# Patient Record
Sex: Male | Born: 1975 | Race: White | Hispanic: No | Marital: Married | Smoking: Never smoker
Health system: Southern US, Community
[De-identification: ages and names within clinical notes are randomized; demographics above are authoritative.]

## PROBLEM LIST (undated history)

## (undated) DIAGNOSIS — E291 Testicular hypofunction: Secondary | ICD-10-CM

## (undated) DIAGNOSIS — F329 Major depressive disorder, single episode, unspecified: Secondary | ICD-10-CM

## (undated) DIAGNOSIS — K219 Gastro-esophageal reflux disease without esophagitis: Secondary | ICD-10-CM

## (undated) DIAGNOSIS — E785 Hyperlipidemia, unspecified: Secondary | ICD-10-CM

## (undated) DIAGNOSIS — Z9989 Dependence on other enabling machines and devices: Principal | ICD-10-CM

## (undated) DIAGNOSIS — E559 Vitamin D deficiency, unspecified: Secondary | ICD-10-CM

## (undated) DIAGNOSIS — F419 Anxiety disorder, unspecified: Secondary | ICD-10-CM

## (undated) DIAGNOSIS — E669 Obesity, unspecified: Secondary | ICD-10-CM

## (undated) DIAGNOSIS — G4733 Obstructive sleep apnea (adult) (pediatric): Secondary | ICD-10-CM

## (undated) DIAGNOSIS — F32A Depression, unspecified: Secondary | ICD-10-CM

## (undated) DIAGNOSIS — I10 Essential (primary) hypertension: Secondary | ICD-10-CM

## (undated) HISTORY — DX: Obstructive sleep apnea (adult) (pediatric): G47.33

## (undated) HISTORY — DX: Depression, unspecified: F32.A

## (undated) HISTORY — DX: Anxiety disorder, unspecified: F41.9

## (undated) HISTORY — DX: Obesity, unspecified: E66.9

## (undated) HISTORY — DX: Testicular hypofunction: E29.1

## (undated) HISTORY — DX: Major depressive disorder, single episode, unspecified: F32.9

## (undated) HISTORY — DX: Dependence on other enabling machines and devices: Z99.89

## (undated) HISTORY — DX: Essential (primary) hypertension: I10

## (undated) HISTORY — DX: Gastro-esophageal reflux disease without esophagitis: K21.9

## (undated) HISTORY — DX: Vitamin D deficiency, unspecified: E55.9

## (undated) HISTORY — DX: Hyperlipidemia, unspecified: E78.5

---

## 2009-11-15 ENCOUNTER — Ambulatory Visit (HOSPITAL_COMMUNITY): Admission: RE | Admit: 2009-11-15 | Discharge: 2009-11-15 | Payer: Self-pay | Admitting: Internal Medicine

## 2013-01-06 ENCOUNTER — Other Ambulatory Visit: Payer: Self-pay | Admitting: Physician Assistant

## 2013-01-06 MED ORDER — ALPRAZOLAM 1 MG PO TABS: 1.0000 mg | ORAL_TABLET | Freq: Every evening | ORAL | Status: DC | PRN

## 2013-01-08 ENCOUNTER — Encounter: Payer: Self-pay | Admitting: Internal Medicine

## 2013-01-09 ENCOUNTER — Ambulatory Visit: Payer: Managed Care, Other (non HMO) | Admitting: Internal Medicine

## 2013-01-09 ENCOUNTER — Encounter: Payer: Self-pay | Admitting: Internal Medicine

## 2013-01-09 VITALS — BP 136/90 | HR 72 | Temp 98.1°F | Resp 16 | Ht 76.0 in | Wt 234.2 lb

## 2013-01-09 DIAGNOSIS — Z113 Encounter for screening for infections with a predominantly sexual mode of transmission: Secondary | ICD-10-CM

## 2013-01-09 DIAGNOSIS — Z0000 Encounter for general adult medical examination without abnormal findings: Secondary | ICD-10-CM

## 2013-01-09 DIAGNOSIS — Z1212 Encounter for screening for malignant neoplasm of rectum: Secondary | ICD-10-CM

## 2013-01-09 DIAGNOSIS — E782 Mixed hyperlipidemia: Secondary | ICD-10-CM

## 2013-01-09 DIAGNOSIS — F3181 Bipolar II disorder: Secondary | ICD-10-CM | POA: Insufficient documentation

## 2013-01-09 DIAGNOSIS — I10 Essential (primary) hypertension: Secondary | ICD-10-CM

## 2013-01-09 DIAGNOSIS — F418 Other specified anxiety disorders: Secondary | ICD-10-CM

## 2013-01-09 DIAGNOSIS — E559 Vitamin D deficiency, unspecified: Secondary | ICD-10-CM

## 2013-01-09 DIAGNOSIS — N529 Male erectile dysfunction, unspecified: Secondary | ICD-10-CM

## 2013-01-09 LAB — CBC WITH DIFFERENTIAL/PLATELET
Basophils Relative: 0 % (ref 0–1)
Eosinophils Absolute: 0 10*3/uL (ref 0.0–0.7)
HCT: 45.7 % (ref 39.0–52.0)
Hemoglobin: 15.9 g/dL (ref 13.0–17.0)
MCHC: 34.8 g/dL (ref 30.0–36.0)
MCV: 89.8 fL (ref 78.0–100.0)
Monocytes Relative: 9 % (ref 3–12)
Neutro Abs: 3 10*3/uL (ref 1.7–7.7)
Neutrophils Relative %: 62 % (ref 43–77)
RBC: 5.09 MIL/uL (ref 4.22–5.81)
WBC: 4.8 10*3/uL (ref 4.0–10.5)

## 2013-01-09 NOTE — Progress Notes (Signed)
Patient ID: Ian Zavala, male   DOB: 1975/10/11, 37 y.o.   MRN: [id number]  Annual Screening Comprehensive Examination  This very nice37 yo MWM presents for complete physical.  Patient has been followed for HTN, Prediabetes, Hyperlipidemia, and Vitamin D Deficiency.   Patient's BP has been controlled at home. Patient denies any cardiac Symptoms as chest pain, palpitations, shortness of breath, dizziness or ankle swelling.   Patient's hyperlipidemia is controlled with diet and medications. Patient denies myalgias or other medication SE's. Last cholesterol last visit was 140 , triglycerides 150, HDL 42  and LDL 68 at goal.     Patient has hx/o abnormal glucose with last A1c 5.3% in June . Patient denies reactive hypoglycemic symptoms, visual blurring, diabetic polys, or paresthesias.    Also, patient has hx/o depression apparently well compensated on his current medication w/o unstable mood changes or suicidal ideations.   Finally, patient has history of Vitamin D Deficiency with last vitamin D      Current outpatient prescriptions: ALPRAZolam (XANAX) 1 MG tablet, Take 1 tablet (1 mg total) by mouth at bedtime as needed for sleep. VITAMIN D  Take 5000 IU daily   enalapril VASOTEC Take 10 mg qd rosuvastatin 20 MG Take 1/2 tablet daily sertraline Take 100 mg Takes 1/2 tab daily testosterone cypionate (DEPOTESTOTERONE CYPIONATE) 200 MG q14 days take  1&1/2 cc every month, bisoprolol-hydrochlorothiazide (ZIAC) 10-6.25 MG omeprazole (PRILOSEC) 40 MG capsule    Allergies  Allergen Reactions  . Citalopram   . Pravastatin   . Wellbutrin [Bupropion]     Past Medical History  Diagnosis Date  . Hyperlipidemia   . Hypertension   . Hypogonadism male   . Vitamin D deficiency   . GERD (gastroesophageal reflux disease)   . Anxiety   . Depression     No past surgical history on file.  Family History  Problem Relation Age of Onset  . Hypertension Mother   . Depression Mother   .  Fibromyalgia Mother   . Depression Brother   . Hypertension Brother     History   Social History  . Marital Status: Married    Spouse Name: N/A    Number of Children: N/A  . Years of Education: N/A   Occupational History  . 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 Smoking status: Never Smoker   . Smokeless tobacco: Never Used  . Alcohol Use: 6.0 oz/week    12 drink(s) per week  . Drug Use: No  . Sexual Activity: Yes      ROS Constitutional: Denies fever, chills, weight loss/gain, headaches, insomnia, fatigue, night sweats, and change in appetite. Eyes: Denies redness, blurred vision, diplopia, discharge, itchy, watery eyes.  ENT: Denies discharge, congestion, post nasal drip, epistaxis, sore throat, earache, hearing loss, dental pain, Tinnitus, Vertigo, Sinus pain, snoring.  Cardio: Denies chest pain, palpitations, irregular heartbeat, syncope, dyspnea, diaphoresis, orthopnea, PND, claudication, edema Respiratory: denies cough, dyspnea, DOE, pleurisy, hoarseness, laryngitis, wheezing.  Gastrointestinal: Denies dysphagia, heartburn, reflux, water brash, pain, cramps, nausea, vomiting, bloating, diarrhea, constipation, hematemesis, melena, hematochezia, jaundice, hemorrhoids Genitourinary: Denies dysuria, frequency, urgency, nocturia, hesitancy, discharge, hematuria, flank pain Musculoskeletal: Denies arthralgia, myalgia, stiffness, Jt. Swelling, pain, limp, and strain/sprain. Skin: Denies puritis, rash, hives, warts, acne, eczema, changing in skin lesion Neuro: Weakness, tremor, incoordination, spasms, paresthesia, pain Psychiatric: Denies confusion, memory loss, sensory loss Endocrine: Denies change in weight, skin, hair change, nocturia, and paresthesia, diabetic polys, visual blurring,  hyper /hypo glycemic episodes.  Heme/Lymph: No excessive bleeding, bruising, enlarged lymph nodes.  Filed Vitals:   01/09/13 1621  BP: 136/90  Pulse: 72   Temp: 98.1 F (36.7 C)  Resp: 16   Body mass index is 28.52 kg/(m^2).  Physical Exam General Appearance: Well nourished, in no apparent distress. Eyes: PERRLA, EOMs, conjunctiva no swelling or erythema, normal fundi and vessels. Sinuses: No frontal/maxillary tenderness ENT/Mouth: EACs patent / TMs  nl. Nares clear without erythema, swelling, mucoid exudates. Oral hygiene is good. No erythema, swelling, or exudate. Tongue normal, non-obstructing. Tonsils not swollen or erythematous. Hearing normal.  Neck: Supple, thyroid normal. No bruits, nodes or JVD. Respiratory: Respiratory effort normal.  BS equal and clear bilateral without rales, rhonci, wheezing or stridor. Cardio: Heart sounds are normal with regular rate and rhythm and no murmurs, rubs or gallops. Peripheral pulses are normal and equal bilaterally without edema. No aortic or femoral bruits. Chest: symmetric with normal excursions and percussion.  Abdomen: Flat, soft, with bowl sounds. Nontender, no guarding, rebound, hernias, masses, or organomegaly.  Lymphatics: Non tender without lymphadenopathy.  Genitourinary: No hernias.Testes nl. DRE - prostate nl for age - smooth & firm w/o nodules. Musculoskeletal: Full ROM all peripheral extremities, joint stability, 5/5 strength, and normal gait. Skin: Warm and dry without rashes, lesions, cyanosis, clubbing or  ecchymosis.  Neuro: Cranial nerves intact, reflexes equal bilaterally. Normal muscle tone, no cerebellar symptoms. Sensation intact.  Pysch: Awake and oriented X 3, normal affect, Insight and Judgment appropriate.   Assessment and Plan  1. Annual Screening Examination 2. Hypertension  3. Hyperlipidemia 4. Pre Diabetes 5. Vitamin D Deficiency 6. Hypogonadism  Continue prudent diet as discussed, weight control, regular exercise, and medications. Routine screening labs and tests as requested with regular follow-up as recommended.

## 2013-01-09 NOTE — Patient Instructions (Signed)
Continue diet & medications same as discussed.   Further disposition pending lab results.        Hypertension As your heart beats, it forces blood through your arteries. This force is your blood pressure. If the pressure is too high, it is called hypertension (HTN) or high blood pressure. HTN is dangerous because you may have it and not know it. High blood pressure may mean that your heart has to work harder to pump blood. Your arteries may be narrow or stiff. The extra work puts you at risk for heart disease, stroke, and other problems.  Blood pressure consists of two numbers, a higher number over a lower, 110/72, for example. It is stated as "110 over 72." The ideal is below 120 for the top number (systolic) and under 80 for the bottom (diastolic). Write down your blood pressure today. You should pay close attention to your blood pressure if you have certain conditions such as:  Heart failure.  Prior heart attack.  Diabetes  Chronic kidney disease.  Prior stroke.  Multiple risk factors for heart disease. To see if you have HTN, your blood pressure should be measured while you are seated with your arm held at the level of the heart. It should be measured at least twice. A one-time elevated blood pressure reading (especially in the Emergency Department) does not mean that you need treatment. There may be conditions in which the blood pressure is different between your right and left arms. It is important to see your caregiver soon for a recheck. Most people have essential hypertension which means that there is not a specific cause. This type of high blood pressure may be lowered by changing lifestyle factors such as:  Stress.  Smoking.  Lack of exercise.  Excessive weight.  Drug/tobacco/alcohol use.  Eating less salt. Most people do not have symptoms from high blood pressure until it has caused damage to the body. Effective treatment can often prevent, delay or reduce that  damage. TREATMENT  When a cause has been identified, treatment for high blood pressure is directed at the cause. There are a large number of medications to treat HTN. These fall into several categories, and your caregiver will help you select the medicines that are best for you. Medications may have side effects. You should review side effects with your caregiver. If your blood pressure stays high after you have made lifestyle changes or started on medicines,   Your medication(s) may need to be changed.  Other problems may need to be addressed.  Be certain you understand your prescriptions, and know how and when to take your medicine.  Be sure to follow up with your caregiver within the time frame advised (usually within two weeks) to have your blood pressure rechecked and to review your medications.  If you are taking more than one medicine to lower your blood pressure, make sure you know how and at what times they should be taken. Taking two medicines at the same time can result in blood pressure that is too low. SEEK IMMEDIATE MEDICAL CARE IF:  You develop a severe headache, blurred or changing vision, or confusion.  You have unusual weakness or numbness, or a faint feeling.  You have severe chest or abdominal pain, vomiting, or breathing problems. MAKE SURE YOU:   Understand these instructions.  Will watch your condition.  Will get help right away if you are not doing well or get worse. Document Released: 02/06/2005 Document Revised: 05/01/2011 Document Reviewed:  09/27/2007 [employer] Patient Information 2014 [place], [place].  Cholesterol Cholesterol is a white, waxy, fat-like protein needed by your body in small amounts. The liver makes all the cholesterol you need. It is carried from the liver by the blood through the blood vessels. Deposits (plaque) may build up on blood vessel walls. This makes the arteries narrower and stiffer. Plaque increases the risk for heart attack and  stroke. You cannot feel your cholesterol level even if it is very high. The only way to know is by a blood test to check your lipid (fats) levels. Once you know your cholesterol levels, you should keep a record of the test results. Work with your caregiver to to keep your levels in the desired range. WHAT THE RESULTS MEAN:  Total cholesterol is a rough measure of all the cholesterol in your blood.  LDL is the so-called bad cholesterol. This is the type that deposits cholesterol in the walls of the arteries. You want this level to be low.  HDL is the good cholesterol because it cleans the arteries and carries the LDL away. You want this level to be high.  Triglycerides are fat that the body can either burn for energy or store. High levels are closely linked to heart disease. DESIRED LEVELS:  Total cholesterol below 200.  LDL below 100 for people at risk, below 70 for very high risk.  HDL above 50 is good, above 60 is best.  Triglycerides below 150. HOW TO LOWER YOUR CHOLESTEROL:  Diet.  Choose fish or white meat chicken and [place], roasted or baked. Limit fatty cuts of red meat, fried foods, and processed meats, such as sausage and lunch meat.  Eat lots of fresh fruits and vegetables. Choose whole grains, beans, pasta, potatoes and cereals.  Use only small amounts of olive, corn or canola oils. Avoid butter, mayonnaise, shortening or palm kernel oils. Avoid foods with trans-fats.  Use skim/nonfat milk and low-fat/nonfat yogurt and cheeses. Avoid whole milk, cream, ice cream, egg yolks and cheeses. Healthy desserts include angel food cake, ginger snaps, animal crackers, hard candy, popsicles, and low-fat/nonfat frozen yogurt. Avoid pastries, cakes, pies and cookies.  Exercise.  A regular program helps decrease LDL and raises HDL.  Helps with weight control.  Do things that increase your activity level like gardening, walking, or taking the stairs.  Medication.  May be  prescribed by your caregiver to help lowering cholesterol and the risk for heart disease.  You may need medicine even if your levels are normal if you have several risk factors. HOME CARE INSTRUCTIONS   Follow your diet and exercise programs as suggested by your caregiver.  Take medications as directed.  Have blood work done when your caregiver feels it is necessary. MAKE SURE YOU:   Understand these instructions.  Will watch your condition.  Will get help right away if you are not doing well or get worse. Document Released: 11/01/2000 Document Revised: 05/01/2011 Document Reviewed: 04/24/2007 [facility] Patient Information 2014 [place], [place].  Sleep Apnea  Sleep apnea is a sleep disorder characterized by abnormal pauses in breathing while you sleep. When your breathing pauses, the level of oxygen in your blood decreases. This causes you to move out of deep sleep and into light sleep. As a result, your quality of sleep is poor, and the system that carries your blood throughout your body (cardiovascular system) experiences stress. If sleep apnea remains untreated, the following conditions can develop:  High blood pressure (hypertension).  Coronary artery disease.  Inability to achieve or maintain an erection (impotence).  Impairment of your thought process (cognitive dysfunction). There are three types of sleep apnea: 1. Obstructive sleep apnea Pauses in breathing during sleep because of a blocked airway. 2. Central sleep apnea Pauses in breathing during sleep because the area of the brain that controls your breathing does not send the correct signals to the muscles that control breathing. 3. Mixed sleep apnea A combination of both obstructive and central sleep apnea. RISK FACTORS The following risk factors can increase your risk of developing sleep apnea:  Being overweight.  Smoking.  Having narrow passages in your nose and throat.  Being of older age.  Being  male.  Alcohol use.  Sedative and tranquilizer use.  Ethnicity. Among individuals younger than 35 years, African Americans are at increased risk of sleep apnea. SYMPTOMS   Difficulty staying asleep.  Daytime sleepiness and fatigue.  Loss of energy.  Irritability.  Loud, heavy snoring.  Morning headaches.  Trouble concentrating.  Forgetfulness.  Decreased interest in sex. DIAGNOSIS  In order to diagnose sleep apnea, your caregiver will perform a physical examination. Your caregiver may suggest that you take a home sleep test. Your caregiver may also recommend that you spend the night in a sleep lab. In the sleep lab, several monitors record information about your heart, lungs, and brain while you sleep. Your leg and arm movements and blood oxygen level are also recorded. TREATMENT The following actions may help to resolve mild sleep apnea:  Sleeping on your side.   Using a decongestant if you have nasal congestion.   Avoiding the use of depressants, including alcohol, sedatives, and narcotics.   Losing weight and modifying your diet if you are overweight. There also are devices and treatments to help open your airway:  Oral appliances. These are custom-made mouthpieces that shift your lower jaw forward and slightly open your bite. This opens your airway.  Devices that create positive airway pressure. This positive pressure "splints" your airway open to help you breathe better during sleep. The following devices create positive airway pressure:  Continuous positive airway pressure (CPAP) device. The CPAP device creates a continuous level of air pressure with an air pump. The air is delivered to your airway through a mask while you sleep. This continuous pressure keeps your airway open.  Nasal expiratory positive airway pressure (EPAP) device. The EPAP device creates positive air pressure as you exhale. The device consists of single-use valves, which are inserted into  each nostril and held in place by adhesive. The valves create very little resistance when you inhale but create much more resistance when you exhale. That increased resistance creates the positive airway pressure. This positive pressure while you exhale keeps your airway open, making it easier to breath when you inhale again.  Bilevel positive airway pressure (BPAP) device. The BPAP device is used mainly in patients with central sleep apnea. This device is similar to the CPAP device because it also uses an air pump to deliver continuous air pressure through a mask. However, with the BPAP machine, the pressure is set at two different levels. The pressure when you exhale is lower than the pressure when you inhale.  Surgery. Typically, surgery is only done if you cannot comply with less invasive treatments or if the less invasive treatments do not improve your condition. Surgery involves removing excess tissue in your airway to create a wider passage way. Document Released: 01/27/2002 Document Revised: 06/03/2012 Document Reviewed: 06/15/2011 [employer] Patient Information 2014  [employer].

## 2013-01-10 LAB — URINALYSIS, MICROSCOPIC ONLY
Bacteria, UA: NONE SEEN
Casts: NONE SEEN
Crystals: NONE SEEN

## 2013-01-10 LAB — MAGNESIUM: Magnesium: 2.2 mg/dL (ref 1.5–2.5)

## 2013-01-10 LAB — BASIC METABOLIC PANEL WITH GFR
Calcium: 9.7 mg/dL (ref 8.4–10.5)
Creat: 1.06 mg/dL (ref 0.50–1.35)
GFR, Est African American: >89 mL/min
GFR, Est Non African American: 89 mL/min
Glucose, Bld: 96 mg/dL (ref 70–99)
Potassium: 4.5 mEq/L (ref 3.5–5.3)
Sodium: 136 mEq/L (ref 135–145)

## 2013-01-10 LAB — HEPATIC FUNCTION PANEL
ALT: 26 U/L (ref 0–53)
Albumin: 4.7 g/dL (ref 3.5–5.2)
Bilirubin, Direct: 0.2 mg/dL (ref 0.0–0.3)
Indirect Bilirubin: 0.8 mg/dL (ref 0.0–0.9)
Total Bilirubin: 1 mg/dL (ref 0.3–1.2)

## 2013-01-10 LAB — LIPID PANEL
HDL: 45 mg/dL (ref >39)
LDL Cholesterol: 93 mg/dL (ref 0–99)
Total CHOL/HDL Ratio: 4.4 Ratio
Triglycerides: 306 mg/dL — ABNORMAL HIGH (ref <150)
VLDL: 61 mg/dL — ABNORMAL HIGH (ref 0–40)

## 2013-01-10 LAB — HIV ANTIBODY (ROUTINE TESTING W REFLEX): HIV: NONREACTIVE

## 2013-01-10 LAB — TSH: TSH: 2.103 u[IU]/mL (ref 0.350–4.500)

## 2013-01-10 LAB — TESTOSTERONE: Testosterone: 1090 ng/dL — ABNORMAL HIGH (ref 300–890)

## 2013-01-10 LAB — VITAMIN D 25 HYDROXY (VIT D DEFICIENCY, FRACTURES): Vit D, 25-Hydroxy: 68 ng/mL (ref 30–89)

## 2013-01-15 ENCOUNTER — Other Ambulatory Visit: Payer: Self-pay | Admitting: Physician Assistant

## 2013-01-15 MED ORDER — ALPRAZOLAM 1 MG PO TABS: 1.0000 mg | ORAL_TABLET | Freq: Three times a day (TID) | ORAL | Status: DC | PRN

## 2013-01-20 LAB — TB SKIN TEST
Induration: 0 mm
TB Skin Test: NEGATIVE

## 2013-02-24 ENCOUNTER — Other Ambulatory Visit: Payer: Self-pay | Admitting: Internal Medicine

## 2013-02-26 ENCOUNTER — Other Ambulatory Visit: Payer: Self-pay | Admitting: Internal Medicine

## 2013-03-07 ENCOUNTER — Other Ambulatory Visit: Payer: Self-pay | Admitting: Emergency Medicine

## 2013-03-31 ENCOUNTER — Other Ambulatory Visit: Payer: Self-pay | Admitting: Internal Medicine

## 2013-04-14 ENCOUNTER — Encounter: Payer: Self-pay | Admitting: Physician Assistant

## 2013-04-14 ENCOUNTER — Ambulatory Visit (INDEPENDENT_AMBULATORY_CARE_PROVIDER_SITE_OTHER): Payer: Managed Care, Other (non HMO) | Admitting: Physician Assistant

## 2013-04-14 VITALS — BP 138/88 | HR 76 | Temp 97.7°F | Resp 16 | Ht 76.0 in | Wt 237.0 lb

## 2013-04-14 DIAGNOSIS — Z79899 Other long term (current) drug therapy: Secondary | ICD-10-CM

## 2013-04-14 DIAGNOSIS — F341 Dysthymic disorder: Secondary | ICD-10-CM

## 2013-04-14 DIAGNOSIS — I10 Essential (primary) hypertension: Secondary | ICD-10-CM

## 2013-04-14 DIAGNOSIS — E782 Mixed hyperlipidemia: Secondary | ICD-10-CM

## 2013-04-14 DIAGNOSIS — E559 Vitamin D deficiency, unspecified: Secondary | ICD-10-CM

## 2013-04-14 DIAGNOSIS — F418 Other specified anxiety disorders: Secondary | ICD-10-CM

## 2013-04-14 DIAGNOSIS — E663 Overweight: Secondary | ICD-10-CM | POA: Insufficient documentation

## 2013-04-14 DIAGNOSIS — E669 Obesity, unspecified: Secondary | ICD-10-CM

## 2013-04-14 LAB — CBC WITH DIFFERENTIAL/PLATELET
BASOS ABS: 0 10*3/uL (ref 0.0–0.1)
Basophils Relative: 0 % (ref 0–1)
Eosinophils Absolute: 0.1 10*3/uL (ref 0.0–0.7)
Eosinophils Relative: 2 % (ref 0–5)
HCT: 44 % (ref 39.0–52.0)
Hemoglobin: 15.4 g/dL (ref 13.0–17.0)
LYMPHS PCT: 30 % (ref 12–46)
Lymphs Abs: 1.5 10*3/uL (ref 0.7–4.0)
MCH: 31.2 pg (ref 26.0–34.0)
MCHC: 35 g/dL (ref 30.0–36.0)
MCV: 89.1 fL (ref 78.0–100.0)
Monocytes Absolute: 0.3 10*3/uL (ref 0.1–1.0)
Monocytes Relative: 6 % (ref 3–12)
NEUTROS ABS: 3.1 10*3/uL (ref 1.7–7.7)
NEUTROS PCT: 62 % (ref 43–77)
PLATELETS: 224 10*3/uL (ref 150–400)
RBC: 4.94 MIL/uL (ref 4.22–5.81)
RDW: 13.1 % (ref 11.5–15.5)
WBC: 5 10*3/uL (ref 4.0–10.5)

## 2013-04-14 NOTE — Patient Instructions (Signed)
 Bad carbs also include fruit juice, alcohol, and sweet tea. These are empty calories that do not signal to your brain that you are full.   Please remember the good carbs are still carbs which convert into sugar. So please measure them out no more than 1/2-1 cup of rice, oatmeal, pasta, and beans.  Veggies are however free foods! Pile them on.   I like lean protein at every meal such as chicken, turkey, pork chops, cottage cheese, etc. Just do not fry these meats and please center your meal around vegetable, the meats should be a side dish.   No all fruit is created equal. Please see the list below, the fruit at the bottom is higher in sugars than the fruit at the top   Fat and Cholesterol Control Diet Fat and cholesterol levels in your blood and organs are influenced by your diet. High levels of fat and cholesterol may lead to diseases of the heart, small and large blood vessels, gallbladder, liver, and pancreas. CONTROLLING FAT AND CHOLESTEROL WITH DIET Although exercise and lifestyle factors are important, your diet is key. That is because certain foods are known to raise cholesterol and others to lower it. The goal is to balance foods for their effect on cholesterol and more importantly, to replace saturated and trans fat with other types of fat, such as monounsaturated fat, polyunsaturated fat, and omega-3 fatty acids. On average, a person should consume no more than 15 to 17 g of saturated fat daily. Saturated and trans fats are considered "bad" fats, and they will raise LDL cholesterol. Saturated fats are primarily found in animal products such as meats, butter, and cream. However, that does not mean you need to give up all your favorite foods. Today, there are good tasting, low-fat, low-cholesterol substitutes for most of the things you like to eat. Choose low-fat or nonfat alternatives. Choose round or loin cuts of red meat. These types of cuts are lowest in fat and cholesterol. Chicken  (without the skin), fish, veal, and ground turkey breast are great choices. Eliminate fatty meats, such as hot dogs and salami. Even shellfish have little or no saturated fat. Have a 3 oz (85 g) portion when you eat lean meat, poultry, or fish. Trans fats are also called "partially hydrogenated oils." They are oils that have been scientifically manipulated so that they are solid at room temperature resulting in a longer shelf life and improved taste and texture of foods in which they are added. Trans fats are found in stick margarine, some tub margarines, cookies, crackers, and baked goods.  When baking and cooking, oils are a great substitute for butter. The monounsaturated oils are especially beneficial since it is believed they lower LDL and raise HDL. The oils you should avoid entirely are saturated tropical oils, such as coconut and palm.  Remember to eat a lot from food groups that are naturally free of saturated and trans fat, including fish, fruit, vegetables, beans, grains (barley, rice, couscous, bulgur wheat), and pasta (without cream sauces).  IDENTIFYING FOODS THAT LOWER FAT AND CHOLESTEROL  Soluble fiber may lower your cholesterol. This type of fiber is found in fruits such as apples, vegetables such as broccoli, potatoes, and carrots, legumes such as beans, peas, and lentils, and grains such as barley. Foods fortified with plant sterols (phytosterol) may also lower cholesterol. You should eat at least 2 g per day of these foods for a cholesterol lowering effect.  Read package labels to identify low-saturated fats,   trans fat free, and low-fat foods at the supermarket. Select cheeses that have only 2 to 3 g saturated fat per ounce. Use a heart-healthy tub margarine that is free of trans fats or partially hydrogenated oil. When buying baked goods (cookies, crackers), avoid partially hydrogenated oils. Breads and muffins should be made from whole grains (whole-wheat or whole oat flour, instead of  "flour" or "enriched flour"). Buy non-creamy canned soups with reduced salt and no added fats.  FOOD PREPARATION TECHNIQUES  Never deep-fry. If you must fry, either stir-fry, which uses very little fat, or use non-stick cooking sprays. When possible, broil, bake, or roast meats, and steam vegetables. Instead of putting butter or margarine on vegetables, use lemon and herbs, applesauce, and cinnamon (for squash and sweet potatoes). Use nonfat yogurt, salsa, and low-fat dressings for salads.  LOW-SATURATED FAT / LOW-FAT FOOD SUBSTITUTES Meats / Saturated Fat (g)  Avoid: Steak, marbled (3 oz/85 g) / 11 g  Choose: Steak, lean (3 oz/85 g) / 4 g  Avoid: Hamburger (3 oz/85 g) / 7 g  Choose: Hamburger, lean (3 oz/85 g) / 5 g  Avoid: Ham (3 oz/85 g) / 6 g  Choose: Ham, lean cut (3 oz/85 g) / 2.4 g  Avoid: Chicken, with skin, dark meat (3 oz/85 g) / 4 g  Choose: Chicken, skin removed, dark meat (3 oz/85 g) / 2 g  Avoid: Chicken, with skin, light meat (3 oz/85 g) / 2.5 g  Choose: Chicken, skin removed, light meat (3 oz/85 g) / 1 g Dairy / Saturated Fat (g)  Avoid: Whole milk (1 cup) / 5 g  Choose: Low-fat milk, 2% (1 cup) / 3 g  Choose: Low-fat milk, 1% (1 cup) / 1.5 g  Choose: Skim milk (1 cup) / 0.3 g  Avoid: Hard cheese (1 oz/28 g) / 6 g  Choose: Skim milk cheese (1 oz/28 g) / 2 to 3 g  Avoid: Cottage cheese, 4% fat (1 cup) / 6.5 g  Choose: Low-fat cottage cheese, 1% fat (1 cup) / 1.5 g  Avoid: Ice cream (1 cup) / 9 g  Choose: Sherbet (1 cup) / 2.5 g  Choose: Nonfat frozen yogurt (1 cup) / 0.3 g  Choose: Frozen fruit bar / trace  Avoid: Whipped cream (1 tbs) / 3.5 g  Choose: Nondairy whipped topping (1 tbs) / 1 g Condiments / Saturated Fat (g)  Avoid: Mayonnaise (1 tbs) / 2 g  Choose: Low-fat mayonnaise (1 tbs) / 1 g  Avoid: Butter (1 tbs) / 7 g  Choose: Extra light margarine (1 tbs) / 1 g  Avoid: Coconut oil (1 tbs) / 11.8 g  Choose: Olive oil (1 tbs) / 1.8  g  Choose: Corn oil (1 tbs) / 1.7 g  Choose: Safflower oil (1 tbs) / 1.2 g  Choose: Sunflower oil (1 tbs) / 1.4 g  Choose: Soybean oil (1 tbs) / 2.4 g  Choose: Canola oil (1 tbs) / 1 g Document Released: 02/06/2005 Document Revised: 06/03/2012 Document Reviewed: 07/28/2010 [employer] Patient Information 2014 [employer].  

## 2013-04-14 NOTE — Progress Notes (Signed)
HPI 38 y.o. male  presents for 3 month follow up with hypertension, hyperlipidemia, prediabetes and vitamin D. His blood pressure has been controlled at home, today their BP is BP: 138/88 mmHg He denies chest pain, shortness of breath, dizziness. He recently moved to a larger house with his wife and 2 kids that he has not been eating as healthy or working out as much.  His cholesterol is diet controlled. In addition they are on crestor and denies myalgias. His cholesterol is controlled. The cholesterol last visit was:   Lab Results  Component Value Date   CHOL 199 01/09/2013   HDL 45 01/09/2013   LDLCALC 93 01/09/2013   TRIG 306* 01/09/2013   CHOLHDL 4.4 01/09/2013   Last Z6XA1C in the office was:  Lab Results  Component Value Date   HGBA1C 5.3 01/09/2013   Patient is on Vitamin D supplement.  Hypogonadism- does the shot every 2 weeks and states he can feel a difference, helps with his energy.   Current Medications:  Current Outpatient Prescriptions on File Prior to Visit  Medication Sig Dispense Refill  . ALPRAZolam (XANAX) 1 MG tablet Take 1 tablet (1 mg total) by mouth 3 (three) times daily as needed for sleep.  90 tablet  2  . bisoprolol-hydrochlorothiazide (ZIAC) 10-6.25 MG per tablet TAKE 1 TABLET DAILY FOR    BLOOD PRESSURE  90 tablet  1  . cholecalciferol (VITAMIN D) 1000 UNITS tablet Take 1,000 Units by mouth daily. Take 5000 IU daily.      . enalapril (VASOTEC) 20 MG tablet TAKE 1 TABLET BY MOUTH EVERY DAY  90 tablet  1  . omeprazole (PRILOSEC) 40 MG capsule       . rosuvastatin (CRESTOR) 20 MG tablet Take 20 mg by mouth daily. Take 1/2 tablet daily.      . sertraline (ZOLOFT) 100 MG tablet TAKE 1 TABLET EVERY NIGHT AT BEDTIME  90 tablet  0  . testosterone cypionate (DEPOTESTOTERONE CYPIONATE) 200 MG/ML injection Inject into the muscle every 28 (twenty-eight) days. 1&1/2 cc every month       No current facility-administered medications on file prior to visit.   Medical  History:  Past Medical History  Diagnosis Date  . Hyperlipidemia   . Hypertension   . Hypogonadism male   . Vitamin D deficiency   . GERD (gastroesophageal reflux disease)   . Anxiety   . Depression   . Obesity    Allergies:  Allergies  Allergen Reactions  . Citalopram   . Pravastatin   . Wellbutrin [Bupropion]      Review of Systems: [X]  = complains of  [ ]  = denies  General: Fatigue [ ]  Fever [ ]  Chills [ ]  Weakness [ ]   Insomnia [ ]  Eyes: Redness [ ]  Blurred vision [ ]  Diplopia [ ]   ENT: Congestion [ ]  Sinus Pain [ ]  Post Nasal Drip [ ]  Sore Throat [ ]  Earache [ ]   Cardiac: Chest pain/pressure [ ]  SOB [ ]  Orthopnea [ ]   Palpitations [ ]   Paroxysmal nocturnal dyspnea[ ]  Claudication [ ]  Edema [ ]   Pulmonary: Cough [ ]  Wheezing[ ]   SOB [ ]   Snoring [ ]   GI: Nausea [ ]  Vomiting[ ]  Dysphagia[ ]  Heartburn[ ]  Abdominal pain [ ]  Constipation [ ] ; Diarrhea [ ] ; BRBPR [ ]  Melena[ ]  GU: Hematuria[ ]  Dysuria [ ]  Nocturia[ ]  Urgency [ ]   Hesitancy [ ]  Discharge [ ]  Neuro: Headaches[ ]  Vertigo[ ]  Paresthesias[ ]  Spasm [ ]   Speech changes [ ]  Incoordination [ ]   Ortho: Arthritis [ ]  Joint pain [ ]  Muscle pain [ ]  Joint swelling [ ]  Back Pain [ ]  Skin:  Rash [ ]   Pruritis [ ]  Change in skin lesion [ ]   Psych: Depression[ ]  Anxiety[ ]  Confusion [ ]  Memory loss [ ]   Heme/Lypmh: Bleeding [ ]  Bruising [ ]  Enlarged lymph nodes [ ]   Endocrine: Visual blurring [ ]  Paresthesia [ ]  Polyuria [ ]  Polydypsea [ ]    Heat/cold intolerance [ ]  Hypoglycemia [ ]   Family history- Review and unchanged Social history- Review and unchanged Physical Exam: Filed Vitals:   04/14/13 1634  BP: 138/88  Pulse: 76  Temp: 97.7 F (36.5 C)  Resp: 16   Wt Readings from Last 3 Encounters:  04/14/13 237 lb (107.502 kg)  01/09/13 234 lb 3.2 oz (106.232 kg)   General Appearance: Well nourished, in no apparent distress. Eyes: PERRLA, EOMs, conjunctiva no swelling or erythema Sinuses: No Frontal/maxillary  tenderness ENT/Mouth: Ext aud canals clear, TMs without erythema, bulging. No erythema, swelling, or exudate on post pharynx.  Tonsils not swollen or erythematous. Hearing normal.  Neck: Supple, thyroid normal.  Respiratory: Respiratory effort normal, BS equal bilaterally without rales, rhonchi, wheezing or stridor.  Cardio: RRR with no MRGs. Brisk peripheral pulses without edema.  Abdomen: Soft, + BS.  Non tender, no guarding, rebound, hernias, masses. Lymphatics: Non tender without lymphadenopathy.  Musculoskeletal: Full ROM, 5/5 strength, normal gait.  Skin: Warm, dry without rashes, lesions, ecchymosis.  Neuro: Cranial nerves intact. Normal muscle tone, no cerebellar symptoms. Sensation intact.  Psych: Awake and oriented X 3, normal affect, Insight and Judgment appropriate.   Assessment and Plan:  Hypertension: Continue medication, monitor blood pressure at home.  Continue DASH diet. Cholesterol: Continue diet and exercise. Check cholesterol.  Hypogonadism- last shot Saturday  Vitamin D Def- check level and continue medications.  Obesity- long discussion weight loss  Continue diet and meds as discussed. Further disposition pending results of labs.  Quentin Mulling 4:59 PM

## 2013-04-15 LAB — MAGNESIUM: Magnesium: 2.1 mg/dL (ref 1.5–2.5)

## 2013-04-15 LAB — LIPID PANEL
CHOL/HDL RATIO: 3.5 ratio
Cholesterol: 198 mg/dL (ref 0–200)
HDL: 57 mg/dL (ref >39)
LDL CALC: 101 mg/dL — AB (ref 0–99)
Triglycerides: 200 mg/dL — ABNORMAL HIGH (ref <150)
VLDL: 40 mg/dL (ref 0–40)

## 2013-04-15 LAB — HEPATIC FUNCTION PANEL
ALBUMIN: 4.7 g/dL (ref 3.5–5.2)
ALK PHOS: 50 U/L (ref 39–117)
ALT: 22 U/L (ref 0–53)
AST: 27 U/L (ref 0–37)
BILIRUBIN INDIRECT: 0.7 mg/dL (ref 0.2–1.2)
BILIRUBIN TOTAL: 0.9 mg/dL (ref 0.2–1.2)
Bilirubin, Direct: 0.2 mg/dL (ref 0.0–0.3)
Total Protein: 7.1 g/dL (ref 6.0–8.3)

## 2013-04-15 LAB — BASIC METABOLIC PANEL WITH GFR
BUN: 13 mg/dL (ref 6–23)
CHLORIDE: 99 meq/L (ref 96–112)
CO2: 29 meq/L (ref 19–32)
CREATININE: 0.96 mg/dL (ref 0.50–1.35)
Calcium: 9.8 mg/dL (ref 8.4–10.5)
GFR, Est African American: >89 mL/min
Glucose, Bld: 76 mg/dL (ref 70–99)
POTASSIUM: 4.3 meq/L (ref 3.5–5.3)
Sodium: 136 mEq/L (ref 135–145)

## 2013-04-15 LAB — VITAMIN D 25 HYDROXY (VIT D DEFICIENCY, FRACTURES): Vit D, 25-Hydroxy: 100 ng/mL — ABNORMAL HIGH (ref 30–89)

## 2013-04-15 LAB — TSH: TSH: 1.713 u[IU]/mL (ref 0.350–4.500)

## 2013-06-02 ENCOUNTER — Other Ambulatory Visit: Payer: Self-pay | Admitting: Internal Medicine

## 2013-06-06 ENCOUNTER — Other Ambulatory Visit: Payer: Self-pay | Admitting: *Deleted

## 2013-06-06 MED ORDER — SERTRALINE HCL 100 MG PO TABS: ORAL_TABLET | ORAL | Status: DC

## 2013-06-07 ENCOUNTER — Other Ambulatory Visit: Payer: Self-pay | Admitting: Internal Medicine

## 2013-07-15 ENCOUNTER — Encounter: Payer: Self-pay | Admitting: Internal Medicine

## 2013-07-15 ENCOUNTER — Ambulatory Visit (INDEPENDENT_AMBULATORY_CARE_PROVIDER_SITE_OTHER): Payer: Managed Care, Other (non HMO) | Admitting: Emergency Medicine

## 2013-07-15 VITALS — BP 138/90 | HR 68 | Temp 98.2°F | Resp 16 | Ht 76.5 in | Wt 232.4 lb

## 2013-07-15 DIAGNOSIS — E559 Vitamin D deficiency, unspecified: Secondary | ICD-10-CM

## 2013-07-15 DIAGNOSIS — R6889 Other general symptoms and signs: Secondary | ICD-10-CM

## 2013-07-15 DIAGNOSIS — I10 Essential (primary) hypertension: Secondary | ICD-10-CM

## 2013-07-15 DIAGNOSIS — E782 Mixed hyperlipidemia: Secondary | ICD-10-CM

## 2013-07-15 DIAGNOSIS — R7309 Other abnormal glucose: Secondary | ICD-10-CM | POA: Insufficient documentation

## 2013-07-15 DIAGNOSIS — Z79899 Other long term (current) drug therapy: Secondary | ICD-10-CM

## 2013-07-15 DIAGNOSIS — E291 Testicular hypofunction: Secondary | ICD-10-CM

## 2013-07-15 DIAGNOSIS — N529 Male erectile dysfunction, unspecified: Secondary | ICD-10-CM

## 2013-07-15 LAB — CBC WITH DIFFERENTIAL/PLATELET
BASOS PCT: 0 % (ref 0–1)
Basophils Absolute: 0 10*3/uL (ref 0.0–0.1)
EOS ABS: 0 10*3/uL (ref 0.0–0.7)
EOS PCT: 1 % (ref 0–5)
HEMATOCRIT: 41.3 % (ref 39.0–52.0)
HEMOGLOBIN: 14.2 g/dL (ref 13.0–17.0)
Lymphocytes Relative: 29 % (ref 12–46)
Lymphs Abs: 1.3 10*3/uL (ref 0.7–4.0)
MCH: 31.6 pg (ref 26.0–34.0)
MCHC: 34.4 g/dL (ref 30.0–36.0)
MCV: 91.8 fL (ref 78.0–100.0)
MONO ABS: 0.3 10*3/uL (ref 0.1–1.0)
MONOS PCT: 6 % (ref 3–12)
Neutro Abs: 2.9 10*3/uL (ref 1.7–7.7)
Neutrophils Relative %: 64 % (ref 43–77)
Platelets: 208 10*3/uL (ref 150–400)
RBC: 4.5 MIL/uL (ref 4.22–5.81)
RDW: 13.5 % (ref 11.5–15.5)
WBC: 4.6 10*3/uL (ref 4.0–10.5)

## 2013-07-15 NOTE — Progress Notes (Deleted)
Patient ID: Ian Zavala, male   DOB: [birth year], 38 y.o.   MRN: [id number]    This very nice 38 y.o. male presents for 3 month follow up with Hypertension, Hyperlipidemia, Pre-Diabetes and Vitamin D Deficiency.    HTN predates since   . BP has been controlled at home. Today's  . Patient denies any cardiac type chest pain, palpitations, dyspnea/orthopnea/PND, dizziness, claudication, or dependent edema.   Hyperlipidemia is controlled with diet & meds. Last Cholesterol was  , Triglycerides were  , HDL    and LDL    . Patient denies myalgias or other med SE's.    Also, the patient has history of PreDiabetes/insulin resistance since     with last A1c of    . Patient denies any symptoms of reactive hypoglycemia, diabetic polys, paresthesias or visual blurring.   Further, Patient has history of Vitamin D Deficiency with last vitamin D of   . Patient supplements vitamin D without any suspected side-effects.    Medication List       This list is accurate as of: 07/15/13  4:52 PM.  Always use your most recent med list.               ALPRAZolam 1 MG tablet  Commonly known as:  XANAX  Take 1 tablet (1 mg total) by mouth 3 (three) times daily as needed for sleep.     bisoprolol-hydrochlorothiazide 10-6.25 MG per tablet  Commonly known as:  ZIAC  TAKE 1 TABLET DAILY FOR    BLOOD PRESSURE     cholecalciferol 1000 UNITS tablet  Commonly known as:  VITAMIN D  Take 1,000 Units by mouth daily. Take 5000 IU daily.     enalapril 20 MG tablet  Commonly known as:  VASOTEC  TAKE 1 TABLET BY MOUTH EVERY DAY     omeprazole 40 MG capsule  Commonly known as:  PRILOSEC  TAKE 1 CAPSULE DAILY FOR   ACID REFLUX     rosuvastatin 20 MG tablet  Commonly known as:  CRESTOR  Take 20 mg by mouth daily. Take 1/2 tablet daily.     sertraline 100 MG tablet  Commonly known as:  ZOLOFT  TAKE 1 TABLET EVERY NIGHT AT BEDTIME     testosterone cypionate 200 MG/ML injection  Commonly known as:   DEPOTESTOTERONE CYPIONATE  Inject into the muscle every 28 (twenty-eight) days. 1&1/2 cc every month         Allergies  Allergen Reactions  . Citalopram   . Pravastatin   . Wellbutrin [Bupropion]     PMHx:   Past Medical History  Diagnosis Date  . Hyperlipidemia   . Hypertension   . Hypogonadism male   . Vitamin D deficiency   . GERD (gastroesophageal reflux disease)   . Anxiety   . Depression   . Obesity     FHx:    Reviewed / unchanged  SHx:    Reviewed / unchanged   Systems Review: Constitutional: Denies fever, chills, wt changes, headaches, insomnia, fatigue, night sweats, change in appetite. Eyes: Denies redness, blurred vision, diplopia, discharge, itchy, watery eyes.  ENT: Denies discharge, congestion, post nasal drip, epistaxis, sore throat, earache, hearing loss, dental pain, tinnitus, vertigo, sinus pain, snoring.  CV: Denies chest pain, palpitations, irregular heartbeat, syncope, dyspnea, diaphoresis, orthopnea, PND, claudication or edema. Respiratory: denies cough, dyspnea, DOE, pleurisy, hoarseness, laryngitis, wheezing.  Gastrointestinal: Denies dysphagia, odynophagia, heartburn, reflux, water brash, abdominal pain or cramps, nausea, vomiting, bloating, diarrhea, constipation, hematemesis,  melena, hematochezia  or hemorrhoids. Genitourinary: Denies dysuria, frequency, urgency, nocturia, hesitancy, discharge, hematuria or flank pain. Musculoskeletal: Denies arthralgias, myalgias, stiffness, jt. swelling, pain, limping or strain/sprain.  Skin: Denies pruritus, rash, hives, warts, acne, eczema or change in skin lesion(s). Neuro: No weakness, tremor, incoordination, spasms, paresthesia or pain. Psychiatric: Denies confusion, memory loss or sensory loss. Endo: Denies change in weight, skin or hair change.  Heme/Lymph: No excessive bleeding, bruising or enlarged lymph nodes.   Exam:  There were no vitals taken for this visit.  Appears well nourished - in no  distress. Eyes: PERRLA, EOMs, conjunctiva no swelling or erythema. Sinuses: No frontal/maxillary tenderness ENT/Mouth: EAC's clear, TM's nl w/o erythema, bulging. Nares clear w/o erythema, swelling, exudates. Oropharynx clear without erythema or exudates. Oral hygiene is good. Tongue normal, non obstructing. Hearing intact.  Neck: Supple. Thyroid nl. Car 2+/2+ without bruits, nodes or JVD. Chest: Respirations nl with BS clear & equal w/o rales, rhonchi, wheezing or stridor.  Cor: Heart sounds normal w/ regular rate and rhythm without sig. murmurs, gallops, clicks, or rubs. Peripheral pulses normal and equal  without edema.  Abdomen: Soft & bowel sounds normal. Non-tender w/o guarding, rebound, hernias, masses, or organomegaly.  Lymphatics: Unremarkable.  Musculoskeletal: Full ROM all peripheral extremities, joint stability, 5/5 strength, and normal gait.  Skin: Warm, dry without exposed rashes, lesions or ecchymosis apparent.  Neuro: Cranial nerves intact, reflexes equal bilaterally. Sensory-motor testing grossly intact. Tendon reflexes grossly intact.  Pysch: Alert & oriented x 3. Insight and judgement nl & appropriate. No ideations.  Assessment and Plan:  1. Hypertension - Continue monitor blood pressure at home. Continue diet/meds same.  2. Hyperlipidemia - Continue diet/meds, exercise,& lifestyle modifications. Continue monitor periodic cholesterol/liver & renal functions   3. Pre-diabetes/Insulin Resistance - Continue diet, exercise, lifestyle modifications. Monitor appropriate labs.  3. Diabetes - continue recommend prudent low glycemic diet, weight control, regular exercise, diabetic monitoring and periodic eye exams.  4. Vitamin D Deficiency - Continue supplementation.  Recommended regular exercise, BP monitoring, weight control, and discussed med and SE's. Recommended labs to assess and monitor clinical status. Further disposition pending results of labs.

## 2013-07-15 NOTE — Progress Notes (Signed)
Subjective:    Patient ID: Ian Zavala, male    DOB: November 30, 1975, 38 y.o.   MRN: [id number]  HPI Comments: 38 yo WM presents for 3 month F/U for HTN, Cholesterol, Pre-Dm, D. Deficient.He notes cook outs over the weekend may have contributed to elevated BP. He also admits to >3 cups coffee QD. He has not been eating as well. He has not been exercising as much. He notes he needs testosterone level rechecked and has improved energy when shots given on schedule.   WBC             5.0   04/14/2013 HGB            15.4   04/14/2013 HCT            44.0   04/14/2013 PLT             224   04/14/2013 GLUCOSE          76   04/14/2013 CHOL            198   04/14/2013 TRIG            200   04/14/2013 HDL              57   04/14/2013 LDLCALC         101   04/14/2013 ALT              22   04/14/2013 AST              27   04/14/2013 NA              136   04/14/2013 K               4.3   04/14/2013 CL               99   04/14/2013 CREATININE     0.96   04/14/2013 BUN              13   04/14/2013 CO2              29   04/14/2013 TSH           1.713   04/14/2013 HGBA1C          5.3   01/09/2013 MICROALBUR     0.54   01/09/2013   Hyperlipidemia  Hypertension  Gastrophageal Reflux     Medication List       This list is accurate as of: 07/15/13  5:23 PM.  Always use your most recent med list.               ALPRAZolam 1 MG tablet  Commonly known as:  XANAX  Take 1 tablet (1 mg total) by mouth 3 (three) times daily as needed for sleep.     bisoprolol-hydrochlorothiazide 10-6.25 MG per tablet  Commonly known as:  ZIAC  TAKE 1 TABLET DAILY FOR    BLOOD PRESSURE     cholecalciferol 1000 UNITS tablet  Commonly known as:  VITAMIN D  Take 1,000 Units by mouth daily. Take 5000 IU daily.     enalapril 20 MG tablet  Commonly known as:  VASOTEC  TAKE 1 TABLET BY MOUTH EVERY DAY     omeprazole 40 MG capsule  Commonly known as:  PRILOSEC  TAKE 1 CAPSULE DAILY FOR   ACID REFLUX     rosuvastatin 20  MG tablet  Commonly known as:  CRESTOR  Take 20 mg by mouth daily. Take 1/2 tablet daily.     sertraline 100 MG tablet  Commonly known as:  ZOLOFT  TAKE 1 TABLET EVERY NIGHT AT BEDTIME     testosterone cypionate 200 MG/ML injection  Commonly known as:  DEPOTESTOTERONE CYPIONATE  Inject into the muscle every 28 (twenty-eight) days. 1&1/2 cc every month       Allergies  Allergen Reactions  . Citalopram   . Pravastatin   . Wellbutrin [Bupropion]    Past Medical History  Diagnosis Date  . Hyperlipidemia   . Hypertension   . Hypogonadism male   . Vitamin D deficiency   . GERD (gastroesophageal reflux disease)   . Anxiety   . Depression   . Obesity       Review of Systems  All other systems reviewed and are negative.  BP 138/90  Pulse 68  Temp(Src) 98.2 F (36.8 C) (Temporal)  Resp 16  Ht 6' 4.5" (1.943 m)  Wt 232 lb 6.4 oz (105.416 kg)  BMI 27.92 kg/m2 REcheck 126/84    Objective:   Physical Exam  Nursing note and vitals reviewed. Constitutional: He is oriented to person, place, and time. He appears well-developed and well-nourished.  HENT:  Head: Normocephalic and atraumatic.  Right Ear: External ear normal.  Left Ear: External ear normal.  Nose: Nose normal.  Eyes: Conjunctivae and EOM are normal.  Neck: Normal range of motion. Neck supple. No JVD present. No thyromegaly present.  Cardiovascular: Normal rate, regular rhythm, normal heart sounds and intact distal pulses.   Pulmonary/Chest: Effort normal and breath sounds normal.  Abdominal: Soft. Bowel sounds are normal. He exhibits no distension. There is no tenderness.  Musculoskeletal: Normal range of motion. He exhibits no edema and no tenderness.  Lymphadenopathy:    He has no cervical adenopathy.  Neurological: He is alert and oriented to person, place, and time. No cranial nerve deficit.  Skin: Skin is warm and dry.  Psychiatric: He has a normal mood and affect. His behavior is normal. Judgment  normal.          Assessment & Plan:  1.  3 month F/U for HTN, Cholesterol, Pre-Dm, D. Deficient. Needs healthy diet, cardio QD and obtain healthy weight. Check Labs, Check BP if >130/80 call office. ADvised decrease caffeine and sodium  2. Hypogonadism- Check labs, advise weight loss, increase activity. Add avocados/ almonds/ zinc if able to tolerate.

## 2013-07-15 NOTE — Patient Instructions (Addendum)
Sodium and Fluid Restriction  Some health conditions may require you to restrict your sodium and fluid intake. Sodium is part of the salt in the blood. Sodium may be restricted because when you take in a lot of salt, you become thirsty. Limiting salt with help you become less thirsty and may make it easier to restrict fluid. Talk to your caregiver or dietician about how many cups of fluid and how many milligrams of sodium you are allowed each day. If your caregiver has restricted your sodium and fluids, usually the amount you can drink depends on several things, such as:  · Your urine output.  · How much fluid you are retaining.  · Your blood pressure.  Every 2 cups (500 mL) of fluid retained in the body becomes an extra 1 pound (0.5 kg) of body weight. The following are examples of some fluids you will have to restrict:  · Tea, coffee, soda, lemonade, milk, water, and juice.  · Alcoholic beverages.  · Cream.  · Gravy.  · Ice cubes.  · Soup and broth.  The following are foods that become liquid at room temperature. These foods will count towards your fluid intake.  · Ice cream and ice milk.  · Frozen yogurt and sherbet.  · Frozen ice pops.  · Flavored gelatin.  YOU MAY BE TAKING IN TOO MUCH FLUID IF:  · Your weight increases.  · Your face, hands, legs, feet, and abdomen start to swell.  · You have trouble breathing.  HOME CARE INSTRUCTIONS  If you follow a low sodium diet closely, you will eat approximately 1,500 mg of sodium a day.   · Avoid salty foods. This increases your thirst and makes fluid control more difficult. Foods high in sodium include:  · Most canned foods, including most meats.  · Most processed foods, including most meats.  · Cheese.  · Dried pasta and rice mixes.  · Snack foods (chips, popcorn, pretzels, cheese puffs, salted nuts).  · Dips, sauces, and salad dressings.  · Do not use salt in cooking or add salt to your meal. Cook with herbs and spices, but not those that have salt in the name. Ask  your caregiver if it is okay to use salt substitutes.  · Eat home-prepared meals. Use fresh ingredients. Avoid canned, frozen, or packaged meals.  · Read food labels to see how much sodium is in the food. Know how much sodium you are allowed each day.  · When eating out, ask for dressings and sauces on the side.  · Weigh yourself every morning with an empty bladder before you eat or drink. If your weight is going up, you are retaining fluid.  · Freeze fruit juice or water in an ice cube tray. Use this as part of your fluid allowance.  · Brush your teeth often or rinse your mouth with mouthwash to help your dry mouth. Lemon wedges, hard sour candies, chewing gum, or breath spray may help to moisten your mouth too.  · Add a slice of fresh lemon or lemon juice to water or ice. This helps satisfy your thirst.  · Try frozen fruits between meals, such as grapes or strawberries.  · Swallow your pills along with meals or soft foods. This helps you save your fluid for something you enjoy.  · Use small cups and glasses and learn to sip fluids slowly.  · Keep your home cooler. Keep the air in your home as humid as possible. Dry air increases   thirst.  · Avoid being out in the hot sun.  Each morning, fill a jug with the amount of water you are allowed for the day. You can use this water as a guideline for fluid allowance. Each time you take in fluid, pour an equal amount of water out of the container. This helps you to see how much fluid you are taking in. It also helps plan your fluid intake for the rest of the day.  CONVERSIONS TO HELP MEASURE FLUID INTAKE  · 1 cup equals 8 oz (240 mL).  · ¾ cup equals 6 oz (180 mL).  ·  cup equals 5  oz (160 mL).  · ½ cup equals 4 oz (120 mL).  ·  cup equals 2  oz (80 mL).  · ¼ cup equals 2 oz (60 mL).  · 2 tbs equals 1 oz (30 mL).  Document Released: 12/04/2006 Document Revised: 08/08/2011 Document Reviewed: 07/21/2011  [employer]® Patient Information ©2014 [employer].

## 2013-07-16 LAB — HEPATIC FUNCTION PANEL
ALT: 19 U/L (ref 0–53)
AST: 29 U/L (ref 0–37)
Albumin: 4.3 g/dL (ref 3.5–5.2)
Alkaline Phosphatase: 50 U/L (ref 39–117)
BILIRUBIN INDIRECT: 0.5 mg/dL (ref 0.2–1.2)
Bilirubin, Direct: 0.2 mg/dL (ref 0.0–0.3)
TOTAL PROTEIN: 6.4 g/dL (ref 6.0–8.3)
Total Bilirubin: 0.7 mg/dL (ref 0.2–1.2)

## 2013-07-16 LAB — BASIC METABOLIC PANEL WITH GFR
BUN: 12 mg/dL (ref 6–23)
CALCIUM: 9.2 mg/dL (ref 8.4–10.5)
CO2: 25 mEq/L (ref 19–32)
Chloride: 102 mEq/L (ref 96–112)
Creat: 1 mg/dL (ref 0.50–1.35)
GFR, Est Non African American: >89 mL/min
GLUCOSE: 86 mg/dL (ref 70–99)
POTASSIUM: 4.4 meq/L (ref 3.5–5.3)
Sodium: 136 mEq/L (ref 135–145)

## 2013-07-16 LAB — LIPID PANEL
CHOLESTEROL: 189 mg/dL (ref 0–200)
HDL: 52 mg/dL (ref >39)
LDL Cholesterol: 98 mg/dL (ref 0–99)
Total CHOL/HDL Ratio: 3.6 Ratio
Triglycerides: 195 mg/dL — ABNORMAL HIGH (ref <150)
VLDL: 39 mg/dL (ref 0–40)

## 2013-07-16 LAB — INSULIN, FASTING: Insulin fasting, serum: 9 u[IU]/mL (ref 3–28)

## 2013-07-16 LAB — TSH: TSH: 1.321 u[IU]/mL (ref 0.350–4.500)

## 2013-07-16 LAB — MAGNESIUM: Magnesium: 1.9 mg/dL (ref 1.5–2.5)

## 2013-07-16 LAB — HEMOGLOBIN A1C
HEMOGLOBIN A1C: 5.8 % — AB (ref <5.7)
MEAN PLASMA GLUCOSE: 120 mg/dL — AB (ref <117)

## 2013-07-16 LAB — VITAMIN D 25 HYDROXY (VIT D DEFICIENCY, FRACTURES): Vit D, 25-Hydroxy: 80 ng/mL (ref 30–89)

## 2013-07-16 LAB — TESTOSTERONE: Testosterone: 568 ng/dL (ref 300–890)

## 2013-08-20 ENCOUNTER — Other Ambulatory Visit: Payer: Self-pay | Admitting: Physician Assistant

## 2013-08-21 ENCOUNTER — Other Ambulatory Visit: Payer: Self-pay | Admitting: Physician Assistant

## 2013-08-21 MED ORDER — ALPRAZOLAM 1 MG PO TABS: ORAL_TABLET | ORAL | Status: DC

## 2013-08-24 ENCOUNTER — Other Ambulatory Visit: Payer: Self-pay | Admitting: Emergency Medicine

## 2013-09-23 ENCOUNTER — Other Ambulatory Visit: Payer: Self-pay | Admitting: Emergency Medicine

## 2013-09-24 ENCOUNTER — Other Ambulatory Visit: Payer: Self-pay | Admitting: Emergency Medicine

## 2013-09-28 ENCOUNTER — Other Ambulatory Visit: Payer: Self-pay | Admitting: Emergency Medicine

## 2013-10-21 ENCOUNTER — Ambulatory Visit: Payer: Self-pay | Admitting: Emergency Medicine

## 2013-10-23 ENCOUNTER — Ambulatory Visit: Payer: Self-pay | Admitting: Physician Assistant

## 2013-11-12 ENCOUNTER — Other Ambulatory Visit: Payer: Self-pay | Admitting: Physician Assistant

## 2013-12-02 ENCOUNTER — Ambulatory Visit (INDEPENDENT_AMBULATORY_CARE_PROVIDER_SITE_OTHER): Payer: Managed Care, Other (non HMO) | Admitting: Physician Assistant

## 2013-12-02 ENCOUNTER — Encounter: Payer: Self-pay | Admitting: Physician Assistant

## 2013-12-02 VITALS — BP 136/80 | HR 72 | Temp 98.0°F | Resp 18 | Ht 76.0 in | Wt 234.0 lb

## 2013-12-02 DIAGNOSIS — I10 Essential (primary) hypertension: Secondary | ICD-10-CM

## 2013-12-02 DIAGNOSIS — R7309 Other abnormal glucose: Secondary | ICD-10-CM

## 2013-12-02 DIAGNOSIS — N529 Male erectile dysfunction, unspecified: Secondary | ICD-10-CM

## 2013-12-02 DIAGNOSIS — N528 Other male erectile dysfunction: Secondary | ICD-10-CM

## 2013-12-02 DIAGNOSIS — E782 Mixed hyperlipidemia: Secondary | ICD-10-CM

## 2013-12-02 DIAGNOSIS — Z79899 Other long term (current) drug therapy: Secondary | ICD-10-CM

## 2013-12-02 DIAGNOSIS — E669 Obesity, unspecified: Secondary | ICD-10-CM

## 2013-12-02 DIAGNOSIS — R7303 Prediabetes: Secondary | ICD-10-CM

## 2013-12-02 DIAGNOSIS — E559 Vitamin D deficiency, unspecified: Secondary | ICD-10-CM

## 2013-12-02 LAB — CBC WITH DIFFERENTIAL/PLATELET
BASOS ABS: 0 10*3/uL (ref 0.0–0.1)
BASOS PCT: 0 % (ref 0–1)
EOS ABS: 0.1 10*3/uL (ref 0.0–0.7)
EOS PCT: 1 % (ref 0–5)
HCT: 41.7 % (ref 39.0–52.0)
Hemoglobin: 14.2 g/dL (ref 13.0–17.0)
Lymphocytes Relative: 17 % (ref 12–46)
Lymphs Abs: 1.2 10*3/uL (ref 0.7–4.0)
MCH: 30.6 pg (ref 26.0–34.0)
MCHC: 34.1 g/dL (ref 30.0–36.0)
MCV: 89.9 fL (ref 78.0–100.0)
Monocytes Absolute: 0.6 10*3/uL (ref 0.1–1.0)
Monocytes Relative: 8 % (ref 3–12)
Neutro Abs: 5.3 10*3/uL (ref 1.7–7.7)
Neutrophils Relative %: 74 % (ref 43–77)
PLATELETS: 210 10*3/uL (ref 150–400)
RBC: 4.64 MIL/uL (ref 4.22–5.81)
RDW: 13.9 % (ref 11.5–15.5)
WBC: 7.1 10*3/uL (ref 4.0–10.5)

## 2013-12-02 LAB — LIPID PANEL
CHOL/HDL RATIO: 4.6 ratio
CHOLESTEROL: 208 mg/dL — AB (ref 0–200)
HDL: 45 mg/dL (ref >39)
LDL Cholesterol: 100 mg/dL — ABNORMAL HIGH (ref 0–99)
Triglycerides: 313 mg/dL — ABNORMAL HIGH (ref <150)
VLDL: 63 mg/dL — ABNORMAL HIGH (ref 0–40)

## 2013-12-02 LAB — BASIC METABOLIC PANEL WITH GFR
BUN: 12 mg/dL (ref 6–23)
CALCIUM: 9.6 mg/dL (ref 8.4–10.5)
CO2: 27 mEq/L (ref 19–32)
CREATININE: 1.07 mg/dL (ref 0.50–1.35)
Chloride: 101 mEq/L (ref 96–112)
GFR, EST NON AFRICAN AMERICAN: 88 mL/min
GFR, Est African American: >89 mL/min
Glucose, Bld: 92 mg/dL (ref 70–99)
Potassium: 4.1 mEq/L (ref 3.5–5.3)
Sodium: 138 mEq/L (ref 135–145)

## 2013-12-02 LAB — MAGNESIUM: Magnesium: 2.1 mg/dL (ref 1.5–2.5)

## 2013-12-02 LAB — HEPATIC FUNCTION PANEL
ALBUMIN: 4.3 g/dL (ref 3.5–5.2)
ALT: 26 U/L (ref 0–53)
AST: 31 U/L (ref 0–37)
Alkaline Phosphatase: 47 U/L (ref 39–117)
BILIRUBIN TOTAL: 0.7 mg/dL (ref 0.2–1.2)
Bilirubin, Direct: 0.2 mg/dL (ref 0.0–0.3)
Indirect Bilirubin: 0.5 mg/dL (ref 0.2–1.2)
TOTAL PROTEIN: 6.8 g/dL (ref 6.0–8.3)

## 2013-12-02 LAB — HEMOGLOBIN A1C
HEMOGLOBIN A1C: 5.5 % (ref <5.7)
Mean Plasma Glucose: 111 mg/dL (ref <117)

## 2013-12-02 LAB — TSH: TSH: 1.335 u[IU]/mL (ref 0.350–4.500)

## 2013-12-02 NOTE — Progress Notes (Signed)
Assessment and Plan:  Hypertension: Continue medication, monitor blood pressure at home. Continue DASH diet. Cholesterol: Continue diet and exercise. Check cholesterol.  Pre-diabetes-Continue diet and exercise. Check A1C Vitamin D Def- check level and continue medications.  Obesity with co morbidities- long discussion about weight loss, diet, and exercise   Continue diet and meds as discussed. Further disposition pending results of labs.  HPI 38 y.o. male  presents for 3 month follow up with hypertension, hyperlipidemia, prediabetes and vitamin D. His blood pressure has been controlled at home, today their BP is BP: 136/80 mmHg He does not workout, he just recently switched from night to day shift  He denies chest pain, shortness of breath, dizziness.  He is on cholesterol medication and denies myalgias. His cholesterol is at goal. The cholesterol last visit was:   Lab Results  Component Value Date   CHOL 189 07/15/2013   HDL 52 07/15/2013   LDLCALC 98 07/15/2013   TRIG 195* 07/15/2013   CHOLHDL 3.6 07/15/2013   He has been working on diet and exercise for prediabetes, and denies paresthesia of the feet, polydipsia and polyuria. Last A1C in the office was:  Lab Results  Component Value Date   HGBA1C 5.8* 07/15/2013   Patient is on Vitamin D supplement.   Lab Results  Component Value Date   VD25OH 8680 07/15/2013       Current Medications:  Current Outpatient Prescriptions on File Prior to Visit  Medication Sig Dispense Refill  . ALPRAZolam (XANAX) 1 MG tablet TAKE 1/2 TO 1 TABLET BY MOUTH 3 TIMES A DAY AS NEEDED  90 tablet  0  . bisoprolol-hydrochlorothiazide (ZIAC) 10-6.25 MG per tablet TAKE 1 TABLET DAILY FOR    BLOOD PRESSURE  90 tablet  0  . cholecalciferol (VITAMIN D) 1000 UNITS tablet Take 1,000 Units by mouth daily. Take 5000 IU daily.      . enalapril (VASOTEC) 20 MG tablet TAKE 1 TABLET BY MOUTH EVERY DAY  30 tablet  0  . omeprazole (PRILOSEC) 40 MG capsule TAKE 1 CAPSULE  DAILY FOR   ACID REFLUX  90 capsule  3  . rosuvastatin (CRESTOR) 20 MG tablet Take 20 mg by mouth daily. Take 1/2 tablet daily.      . sertraline (ZOLOFT) 100 MG tablet TAKE 1 TABLET EVERY NIGHT AT BEDTIME  90 tablet  0  . testosterone cypionate (DEPOTESTOTERONE CYPIONATE) 200 MG/ML injection Inject into the muscle every 28 (twenty-eight) days. 1&1/2 cc every month       No current facility-administered medications on file prior to visit.   Medical History:  Past Medical History  Diagnosis Date  . Hyperlipidemia   . Hypertension   . Hypogonadism male   . Vitamin D deficiency   . GERD (gastroesophageal reflux disease)   . Anxiety   . Depression   . Obesity    Allergies:  Allergies  Allergen Reactions  . Citalopram   . Pravastatin   . Wellbutrin [Bupropion]      Review of Systems: [X]  = complains of  [ ]  = denies  General: Fatigue [ ]  Fever [ ]  Chills [ ]  Weakness [ ]   Insomnia [ ]  Eyes: Redness [ ]  Blurred vision [ ]  Diplopia [ ]   ENT: Congestion [ ]  Sinus Pain [ ]  Post Nasal Drip [ ]  Sore Throat [ ]  Earache [ ]   Cardiac: Chest pain/pressure [ ]  SOB [ ]  Orthopnea [ ]   Palpitations [ ]   Paroxysmal nocturnal dyspnea[ ]  Claudication [ ]   Edema [ ]   Pulmonary: Cough [ ]  Wheezing[ ]   SOB [ ]   Snoring [ ]   GI: Nausea [ ]  Vomiting[ ]  Dysphagia[ ]  Heartburn[ ]  Abdominal pain [ ]  Constipation [ ] ; Diarrhea [ ] ; BRBPR [ ]  Melena[ ]  GU: Hematuria[ ]  Dysuria [ ]  Nocturia[ ]  Urgency [ ]   Hesitancy [ ]  Discharge [ ]  Neuro: Headaches[ ]  Vertigo[ ]  Paresthesias[ ]  Spasm [ ]  Speech changes [ ]  Incoordination [ ]   Ortho: Arthritis [ ]  Joint pain [ ]  Muscle pain [ ]  Joint swelling [ ]  Back Pain [ ]  Skin:  Rash [ ]   Pruritis [ ]  Change in skin lesion [ ]   Psych: Depression[ ]  Anxiety[ ]  Confusion [ ]  Memory loss [ ]   Heme/Lypmh: Bleeding [ ]  Bruising [ ]  Enlarged lymph nodes [ ]   Endocrine: Visual blurring [ ]  Paresthesia [ ]  Polyuria [ ]  Polydypsea [ ]    Heat/cold intolerance [ ]  Hypoglycemia [  ]  Family history- Review and unchanged Social history- Review and unchanged Physical Exam: BP 136/80  Pulse 72  Temp(Src) 98 F (36.7 C) (Temporal)  Resp 18  Ht 6\' 4"  (1.93 m)  Wt 234 lb (106.142 kg)  BMI 28.50 kg/m2 Wt Readings from Last 3 Encounters:  12/02/13 234 lb (106.142 kg)  07/15/13 232 lb 6.4 oz (105.416 kg)  04/14/13 237 lb (107.502 kg)   General Appearance: Well nourished, in no apparent distress. Eyes: PERRLA, EOMs, conjunctiva no swelling or erythema Sinuses: No Frontal/maxillary tenderness ENT/Mouth: Ext aud canals clear, TMs without erythema, bulging. No erythema, swelling, or exudate on post pharynx.  Tonsils not swollen or erythematous. Hearing normal.  Neck: Supple, thyroid normal.  Respiratory: Respiratory effort normal, BS equal bilaterally without rales, rhonchi, wheezing or stridor.  Cardio: RRR with no MRGs. Brisk peripheral pulses without edema.  Abdomen: Soft, + BS.  Non tender, no guarding, rebound, hernias, masses. Lymphatics: Non tender without lymphadenopathy.  Musculoskeletal: Full ROM, 5/5 strength, normal gait.  Skin: Warm, dry without rashes, lesions, ecchymosis.  Neuro: Cranial nerves intact. Normal muscle tone, no cerebellar symptoms. Sensation intact.  Psych: Awake and oriented X 3, normal affect, Insight and Judgment appropriate.    Quentin MullingCollier, Sharnelle Cappelli 3:43 PM

## 2014-01-01 ENCOUNTER — Other Ambulatory Visit: Payer: Self-pay | Admitting: *Deleted

## 2014-01-01 MED ORDER — BISOPROLOL-HYDROCHLOROTHIAZIDE 10-6.25 MG PO TABS: ORAL_TABLET | ORAL | Status: DC

## 2014-01-21 ENCOUNTER — Encounter: Payer: Self-pay | Admitting: Internal Medicine

## 2014-02-18 ENCOUNTER — Other Ambulatory Visit: Payer: Self-pay | Admitting: Physician Assistant

## 2014-03-18 ENCOUNTER — Ambulatory Visit (INDEPENDENT_AMBULATORY_CARE_PROVIDER_SITE_OTHER): Payer: Managed Care, Other (non HMO) | Admitting: Internal Medicine

## 2014-03-18 ENCOUNTER — Encounter: Payer: Self-pay | Admitting: Internal Medicine

## 2014-03-18 VITALS — BP 130/92 | HR 60 | Temp 98.0°F | Resp 18 | Ht 76.0 in | Wt 240.0 lb

## 2014-03-18 DIAGNOSIS — N529 Male erectile dysfunction, unspecified: Secondary | ICD-10-CM

## 2014-03-18 DIAGNOSIS — R945 Abnormal results of liver function studies: Secondary | ICD-10-CM

## 2014-03-18 DIAGNOSIS — Z125 Encounter for screening for malignant neoplasm of prostate: Secondary | ICD-10-CM

## 2014-03-18 DIAGNOSIS — R7989 Other specified abnormal findings of blood chemistry: Secondary | ICD-10-CM

## 2014-03-18 DIAGNOSIS — R7303 Prediabetes: Secondary | ICD-10-CM

## 2014-03-18 DIAGNOSIS — R972 Elevated prostate specific antigen [PSA]: Secondary | ICD-10-CM

## 2014-03-18 DIAGNOSIS — R7309 Other abnormal glucose: Secondary | ICD-10-CM

## 2014-03-18 DIAGNOSIS — Z113 Encounter for screening for infections with a predominantly sexual mode of transmission: Secondary | ICD-10-CM

## 2014-03-18 DIAGNOSIS — R5383 Other fatigue: Secondary | ICD-10-CM

## 2014-03-18 DIAGNOSIS — R799 Abnormal finding of blood chemistry, unspecified: Secondary | ICD-10-CM

## 2014-03-18 DIAGNOSIS — Z79899 Other long term (current) drug therapy: Secondary | ICD-10-CM

## 2014-03-18 DIAGNOSIS — Z1212 Encounter for screening for malignant neoplasm of rectum: Secondary | ICD-10-CM

## 2014-03-18 DIAGNOSIS — E299 Testicular dysfunction, unspecified: Secondary | ICD-10-CM

## 2014-03-18 DIAGNOSIS — I10 Essential (primary) hypertension: Secondary | ICD-10-CM

## 2014-03-18 DIAGNOSIS — E782 Mixed hyperlipidemia: Secondary | ICD-10-CM

## 2014-03-18 DIAGNOSIS — E669 Obesity, unspecified: Secondary | ICD-10-CM

## 2014-03-18 DIAGNOSIS — E559 Vitamin D deficiency, unspecified: Secondary | ICD-10-CM

## 2014-03-18 LAB — CBC WITH DIFFERENTIAL/PLATELET
BASOS ABS: 0 10*3/uL (ref 0.0–0.1)
Basophils Relative: 0 % (ref 0–1)
EOS PCT: 1 % (ref 0–5)
Eosinophils Absolute: 0 10*3/uL (ref 0.0–0.7)
HEMATOCRIT: 44.1 % (ref 39.0–52.0)
Hemoglobin: 15 g/dL (ref 13.0–17.0)
LYMPHS PCT: 33 % (ref 12–46)
Lymphs Abs: 1.6 10*3/uL (ref 0.7–4.0)
MCH: 31.4 pg (ref 26.0–34.0)
MCHC: 34 g/dL (ref 30.0–36.0)
MCV: 92.3 fL (ref 78.0–100.0)
MONO ABS: 0.3 10*3/uL (ref 0.1–1.0)
MONOS PCT: 7 % (ref 3–12)
MPV: 11.1 fL (ref 8.6–12.4)
NEUTROS PCT: 59 % (ref 43–77)
Neutro Abs: 2.8 10*3/uL (ref 1.7–7.7)
Platelets: 184 10*3/uL (ref 150–400)
RBC: 4.78 MIL/uL (ref 4.22–5.81)
RDW: 13.2 % (ref 11.5–15.5)
WBC: 4.8 10*3/uL (ref 4.0–10.5)

## 2014-03-18 NOTE — Progress Notes (Signed)
Patient ID: Ian Zavala, male   DOB: 07/27/1975, 39 y.o.   MRN: [id number] Annual Comprehensive Examination  This very nice 39 y.o. MWM presents for complete physical.  Patient has been followed for HTN, Morbid Obesity,  Prediabetes, Hyperlipidemia, Testosterone and Vitamin D Deficiency.   HTN predates since age 79 yo in 23. Patient's BP has been controlled at home.Today's BP is 130/92. Pt reports home BP monitoring has been normal. Patient denies any cardiac symptoms as chest pain, palpitations, shortness of breath, dizziness or ankle swelling.   Patient's hyperlipidemia is controlled with diet and medications. Patient denies myalgias or other medication SE's. Last lipids were at goal - Total Chol 208; HDL  45; LDL 100; with elevated Triglycerides 313 on 12/02/2013.   Patient has  Morbid Obesity and consequent prediabetes since  May 2015 with A1c 5.8% and patient denies reactive hypoglycemic symptoms, visual blurring, diabetic polys or paresthesias. Last A1c was 5.5% on 12/02/2013.   Patient has hx/o low t 245 in 2012 & has been on replacement Tx with Depo-Testosterone since with improved sense of stamina & well-being. Finally, patient has history of Vitamin D Deficiency of 25 in 2008 and last vitamin D was 80 on 07/15/2013.   Medication Sig  . ALPRAZolam (XANAX) 1 MG tablet TAKE 1/2 TO 1 TABLET BY MOUTH 3 TIMES A DAY AS NEEDED  . bisoprolol-hctz (ZIAC) 10-6.25 MG per tablet TAKE 1 TABLET DAILY FOR    BLOOD PRESSURE  . cholecalciferol (VITAMIN D) 1000 UNITS  Take 1,000 Units by mouth daily. Take 5000 IU daily.  . enalapril (VASOTEC) 20 MG tablet TAKE 1 TABLET BY MOUTH EVERY DAY  . omeprazole (PRILOSEC) 40 MG capsule TAKE 1 CAPSULE DAILY FOR   ACID REFLUX  . rosuvastatin (CRESTOR) 20 MG tablet Take 20 mg by mouth daily. Take 1/2 tablet daily.  . sertraline (ZOLOFT) 100 MG tablet TAKE 1 TABLET EVERY NIGHT AT BEDTIME  . DEPOTESTOTERONE  200 MG/ML injection Takes 2 cc q 2wks   Allergies   Allergen Reactions  . Citalopram   . Pravastatin   . Wellbutrin [Bupropion]    Past Medical History  Diagnosis Date  . Hyperlipidemia   . Hypertension   . Hypogonadism male   . Vitamin D deficiency   . GERD (gastroesophageal reflux disease)   . Anxiety   . Depression   . Obesity    Health Maintenance  Topic Date Due  . TETANUS/TDAP  12/12/1994  . INFLUENZA VACCINE  09/20/2013   Immunization History  Administered Date(s) Administered  . DTaP 02/21/2004  . Influenza Whole 12/19/2011  . PPD Test 01/09/2013   History reviewed. No pertinent past surgical history.   Family History  Problem Relation Age of Onset  . Hypertension Mother   . Depression Mother   . Fibromyalgia Mother   . Depression Brother   . Hypertension Brother    History   Social History  . Marital Status: Married    Spouse Name: N/A    Number of Children: N/A  . Years of Education: N/A   Occupational History  . 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 Smoking status: Never Smoker   . Smokeless tobacco: Never Used  . Alcohol Use: 6.0 oz/week    12 drink(s) per week  . Drug Use: No  . Sexual Activity: Yes     ROS Constitutional: Denies fever, chills, weight loss/gain, headaches, insomnia, fatigue, night sweats or change in  appetite. Eyes: Denies redness, blurred vision, diplopia, discharge, itchy or watery eyes.  ENT: Denies discharge, congestion, post nasal drip, epistaxis, sore throat, earache, hearing loss, dental pain, Tinnitus, Vertigo, Sinus pain or snoring.  Cardio: Denies chest pain, palpitations, irregular heartbeat, syncope, dyspnea, diaphoresis, orthopnea, PND, claudication or edema Respiratory: denies cough, dyspnea, DOE, pleurisy, hoarseness, laryngitis or wheezing.  Gastrointestinal: Denies dysphagia, heartburn, reflux, water brash, pain, cramps, nausea, vomiting, bloating, diarrhea, constipation, hematemesis, melena, hematochezia, jaundice or  hemorrhoids Genitourinary: Denies dysuria, frequency, urgency, nocturia, hesitancy, discharge, hematuria or flank pain Musculoskeletal: Denies arthralgia, myalgia, stiffness, Jt. Swelling, pain, limp or strain/sprain. Denies Falls. Skin: Denies puritis, rash, hives, warts, acne, eczema or change in skin lesion Neuro: No weakness, tremor, incoordination, spasms, paresthesia or pain Psychiatric: Denies confusion, memory loss or sensory loss. Denies Depression. Endocrine: Denies change in weight, skin, hair change, nocturia, and paresthesia, diabetic polys, visual blurring or hyper / hypo glycemic episodes.  Heme/Lymph: No excessive bleeding, bruising or enlarged lymph nodes.  Physical Exam  BP 130/92  Pulse 60  Temp98 F  Resp 18  Ht 6\' 4"    Wt 240 lb     BMI 29.23   General Appearance: Well nourished, in no apparent distress. Eyes: PERRLA, EOMs, conjunctiva no swelling or erythema, normal fundi and vessels. Sinuses: No frontal/maxillary tenderness ENT/Mouth: EACs patent / TMs  nl. Nares clear without erythema, swelling, mucoid exudates. Oral hygiene is good. No erythema, swelling, or exudate. Tongue normal, non-obstructing. Tonsils not swollen or erythematous. Hearing normal.  Neck: Supple, thyroid normal. No bruits, nodes or JVD. Respiratory: Respiratory effort normal.  BS equal and clear bilateral without rales, rhonci, wheezing or stridor. Cardio: Heart sounds are normal with regular rate and rhythm and no murmurs, rubs or gallops. Peripheral pulses are normal and equal bilaterally without edema. No aortic or femoral bruits. Chest: symmetric with normal excursions and percussion.  Abdomen: Flat, soft, with bowl sounds. Nontender, no guarding, rebound, hernias, masses, or organomegaly.  Lymphatics: Non tender without lymphadenopathy.  Genitourinary: Deferred for age and lack of sx's. Musculoskeletal: Full ROM all peripheral extremities, joint stability, 5/5 strength, and normal  gait. Skin: Warm and dry without rashes, lesions, cyanosis, clubbing or  ecchymosis.  Neuro: Cranial nerves intact, reflexes equal bilaterally. Normal muscle tone, no cerebellar symptoms. Sensation intact.  Pysch: Awake and oriented X 3 with normal affect, insight and judgment appropriate.   Assessment and Plan  1. Essential hypertension  - Microalbumin / creatinine urine ratio - EKG 12-Lead  2. Hyperlipidemia  - Lipid panel  3. Obesity   4. Prediabetes  - Hemoglobin A1c - Insulin, fasting  5. Vitamin D deficiency  - Vit D  25 hydroxy (rtn osteoporosis monitoring)  6. Testosterone Deficiency  - Testosterone  7. Screening for rectal cancer  - POC Hemoccult Bld/Stl (3-Cd Home Screen); Future  8. Prostate cancer screening  - PSA  9. VD (venereal disease) screening   10. Abnormal LFTs  - Hepatitis A antibody, total - Hepatitis B core antibody, total - Hepatitis B e antibody - Hepatitis B surface antibody - Hepatitis C antibody  11. Encounter for long-term (current) use of medications  - Urine Microscopic - CBC with Differential/Platelet - BASIC METABOLIC PANEL WITH GFR - Hepatic function panel - Magnesium  12. Other fatigue  - Vitamin B12 - Iron and TIBC - TSH   Continue prudent diet as discussed, weight control, BP monitoring, regular exercise, and medications as discussed.  Discussed med effects and SE's. Routine screening labs and  tests as requested with regular follow-up as recommended. ROV - 3 mo for Lipid/liver monitoring.

## 2014-03-18 NOTE — Patient Instructions (Signed)
 Recommend the book "The END of DIETING" by Dr Joel Fuhrman   & the book "The END of DIABETES " by Dr Joel Fuhrman  At [employer] - get book & Audio CD's      Being diabetic has a  300% increased risk for heart attack, stroke, cancer, and alzheimer- type vascular dementia. It is very important that you work harder with diet by avoiding all foods that are white except chicken & fish. Avoid white rice (brown & wild rice is OK), white potatoes (sweetpotatoes in moderation is OK), White bread or wheat bread or anything made out of white flour like bagels, donuts, rolls, buns, biscuits, cakes, pastries, cookies, pizza crust, and pasta (made from white flour & egg whites) - vegetarian pasta or spinach or wheat pasta is OK. Multigrain breads like Arnold's or [employer], or multigrain sandwich thins or flatbreads.  Diet, exercise and weight loss can reverse and cure diabetes in the early stages.  Diet, exercise and weight loss is very important in the control and prevention of complications of diabetes which affects every system in your body, ie. Brain - dementia/stroke, eyes - glaucoma/blindness, heart - heart attack/heart failure, kidneys - dialysis, stomach - gastric paralysis, intestines - malabsorption, nerves - severe painful neuritis, circulation - gangrene & loss of a leg(s), and finally cancer and Alzheimers.    I recommend avoid fried & greasy foods,  sweets/candy, white rice (brown or wild rice or Quinoa is OK), white potatoes (sweet potatoes are OK) - anything made from white flour - bagels, doughnuts, rolls, buns, biscuits,white and wheat breads, pizza crust and traditional pasta made of white flour & egg white(vegetarian pasta or spinach or wheat pasta is OK).  Multi-grain bread is OK - like multi-grain flat bread or sandwich thins. Avoid alcohol in excess. Exercise is also important.    Eat all the vegetables you want - avoid meat, especially red meat and dairy - especially cheese.  Cheese  is the most concentrated form of trans-fats which is the worst thing to clog up our arteries. Veggie cheese is OK which can be found in the fresh produce section at [employer] or [employer] or [employer]  Preventive Care for Adults  A healthy lifestyle and preventive care can promote health and wellness. Preventive health guidelines for men include the following key practices:  A routine yearly physical is a good way to check with your health care provider about your health and preventative screening. It is a chance to share any concerns and updates on your health and to receive a thorough exam.  Visit your dentist for a routine exam and preventative care every 6 months. Brush your teeth twice a day and floss once a day. Good oral hygiene prevents tooth decay and gum disease.  The frequency of eye exams is based on your age, health, family medical history, use of contact lenses, and other factors. Follow your health care provider's recommendations for frequency of eye exams.  Eat a healthy diet. Foods such as vegetables, fruits, whole grains, low-fat dairy products, and lean protein foods contain the nutrients you need without too many calories. Decrease your intake of foods high in solid fats, added sugars, and salt. Eat the right amount of calories for you.Get information about a proper diet from your health care provider, if necessary.  Regular physical exercise is one of the most important things you can do for your health. Most adults should get at least 150 minutes of moderate-intensity exercise (any activity   that increases your heart rate and causes you to sweat) each week. In addition, most adults need muscle-strengthening exercises on 2 or more days a week.  Maintain a healthy weight. The body mass index (BMI) is a screening tool to identify possible weight problems. It provides an estimate of body fat based on height and weight. Your health care provider can find your BMI and can help  you achieve or maintain a healthy weight.For adults 20 years and older:  A BMI below 18.5 is considered underweight.  A BMI of 18.5 to 24.9 is normal.  A BMI of 25 to 29.9 is considered overweight.  A BMI of 30 and above is considered obese.  Maintain normal blood lipids and cholesterol levels by exercising and minimizing your intake of saturated fat. Eat a balanced diet with plenty of fruit and vegetables. Blood tests for lipids and cholesterol should begin at age 20 and be repeated every 5 years. If your lipid or cholesterol levels are high, you are over 50, or you are at high risk for heart disease, you may need your cholesterol levels checked more frequently.Ongoing high lipid and cholesterol levels should be treated with medicines if diet and exercise are not working.  If you smoke, find out from your health care provider how to quit. If you do not use tobacco, do not start.  Lung cancer screening is recommended for adults aged 55-80 years who are at high risk for developing lung cancer because of a history of smoking. A yearly low-dose CT scan of the lungs is recommended for people who have at least a 30-pack-year history of smoking and are a current smoker or have quit within the past 15 years. A pack year of smoking is smoking an average of 1 pack of cigarettes a day for 1 year (for example: 1 pack a day for 30 years or 2 packs a day for 15 years). Yearly screening should continue until the smoker has stopped smoking for at least 15 years. Yearly screening should be stopped for people who develop a health problem that would prevent them from having lung cancer treatment.  If you choose to drink alcohol, do not have more than 2 drinks per day. One drink is considered to be 12 ounces (355 mL) of beer, 5 ounces (148 mL) of wine, or 1.5 ounces (44 mL) of liquor.  Avoid use of street drugs. Do not share needles with anyone. Ask for help if you need support or instructions about stopping the  use of drugs.  High blood pressure causes heart disease and increases the risk of stroke. Your blood pressure should be checked at least every 1-2 years. Ongoing high blood pressure should be treated with medicines, if weight loss and exercise are not effective.  If you are 45-79 years old, ask your health care provider if you should take aspirin to prevent heart disease.  Diabetes screening involves taking a blood sample to check your fasting blood sugar level. This should be done once every 3 years, after age 45, if you are within normal weight and without risk factors for diabetes. Testing should be considered at a younger age or be carried out more frequently if you are overweight and have at least 1 risk factor for diabetes.  Colorectal cancer can be detected and often prevented. Most routine colorectal cancer screening begins at the age of 50 and continues through age 75. However, your health care provider may recommend screening at an earlier age if you have   risk factors for colon cancer. On a yearly basis, your health care provider may provide home test kits to check for hidden blood in the stool. Use of a small camera at the end of a tube to directly examine the colon (sigmoidoscopy or colonoscopy) can detect the earliest forms of colorectal cancer. Talk to your health care provider about this at age 50, when routine screening begins. Direct exam of the colon should be repeated every 5-10 years through age 75, unless early forms of precancerous polyps or small growths are found.   Talk with your health care provider about prostate cancer screening.  Testicular cancer screening isrecommended for adult males. Screening includes self-exam, a health care provider exam, and other screening tests. Consult with your health care provider about any symptoms you have or any concerns you have about testicular cancer.  Use sunscreen. Apply sunscreen liberally and repeatedly throughout the day. You should  seek shade when your shadow is shorter than you. Protect yourself by wearing long sleeves, pants, a wide-brimmed hat, and sunglasses year round, whenever you are outdoors.  Once a month, do a whole-body skin exam, using a mirror to look at the skin on your back. Tell your health care provider about new moles, moles that have irregular borders, moles that are larger than a pencil eraser, or moles that have changed in shape or color.  Stay current with required vaccines (immunizations).  Influenza vaccine. All adults should be immunized every year.  Tetanus, diphtheria, and acellular pertussis (Td, Tdap) vaccine. An adult who has not previously received Tdap or who does not know his vaccine status should receive 1 dose of Tdap. This initial dose should be followed by tetanus and diphtheria toxoids (Td) booster doses every 10 years. Adults with an unknown or incomplete history of completing a 3-dose immunization series with Td-containing vaccines should begin or complete a primary immunization series including a Tdap dose. Adults should receive a Td booster every 10 years.  Varicella vaccine. An adult without evidence of immunity to varicella should receive 2 doses or a second dose if he has previously received 1 dose.  Human papillomavirus (HPV) vaccine. Males aged 13-21 years who have not received the vaccine previously should receive the 3-dose series. Males aged 22-26 years may be immunized. Immunization is recommended through the age of 26 years for any male who has sex with males and did not get any or all doses earlier. Immunization is recommended for any person with an immunocompromised condition through the age of 26 years if he did not get any or all doses earlier. During the 3-dose series, the second dose should be obtained 4-8 weeks after the first dose. The third dose should be obtained 24 weeks after the first dose and 16 weeks after the second dose.  Zoster vaccine. One dose is recommended  for adults aged 60 years or older unless certain conditions are present.    PREVNAR  - Pneumococcal 13-valent conjugate (PCV13) vaccine. When indicated, a person who is uncertain of his immunization history and has no record of immunization should receive the PCV13 vaccine. An adult aged 19 years or older who has certain medical conditions and has not been previously immunized should receive 1 dose of PCV13 vaccine. This PCV13 should be followed with a dose of pneumococcal polysaccharide (PPSV23) vaccine. The PPSV23 vaccine dose should be obtained at least 8 weeks after the dose of PCV13 vaccine. An adult aged 19 years or older who has certain medical conditions and previously   received 1 or more doses of PPSV23 vaccine should receive 1 dose of PCV13. The PCV13 vaccine dose should be obtained 1 or more years after the last PPSV23 vaccine dose.    PNEUMOVAX - Pneumococcal polysaccharide (PPSV23) vaccine. When PCV13 is also indicated, PCV13 should be obtained first. All adults aged 65 years and older should be immunized. An adult younger than age 65 years who has certain medical conditions should be immunized. Any person who resides in a nursing home or long-term care facility should be immunized. An adult smoker should be immunized. People with an immunocompromised condition and certain other conditions should receive both PCV13 and PPSV23 vaccines. People with human immunodeficiency virus (HIV) infection should be immunized as soon as possible after diagnosis. Immunization during chemotherapy or radiation therapy should be avoided. Routine use of PPSV23 vaccine is not recommended for American Indians, [place] Natives, or people younger than 65 years unless there are medical conditions that require PPSV23 vaccine. When indicated, people who have unknown immunization and have no record of immunization should receive PPSV23 vaccine. One-time revaccination 5 years after the first dose of PPSV23 is recommended for  people aged 19-64 years who have chronic kidney failure, nephrotic syndrome, asplenia, or immunocompromised conditions. People who received 1-2 doses of PPSV23 before age 65 years should receive another dose of PPSV23 vaccine at age 65 years or later if at least 5 years have passed since the previous dose. Doses of PPSV23 are not needed for people immunized with PPSV23 at or after age 65 years.    Hepatitis A vaccine. Adults who wish to be protected from this disease, have certain high-risk conditions, work with hepatitis A-infected animals, work in hepatitis A research labs, or travel to or work in countries with a high rate of hepatitis A should be immunized. Adults who were previously unvaccinated and who anticipate close contact with an international adoptee during the first 60 days after arrival in the [place] from a country with a high rate of hepatitis A should be immunized.    Hepatitis B vaccine. Adults should be immunized if they wish to be protected from this disease, have certain high-risk conditions, may be exposed to blood or other infectious body fluids, are household contacts or sex partners of hepatitis B positive people, are clients or workers in certain care facilities, or travel to or work in countries with a high rate of hepatitis B.   Preventive Service / Frequency   Ages 40 to 64  Blood pressure check.  Lipid and cholesterol check  Lung cancer screening. / Every year if you are aged 55-80 years and have a 30-pack-year history of smoking and currently smoke or have quit within the past 15 years. Yearly screening is stopped once you have quit smoking for at least 15 years or develop a health problem that would prevent you from having lung cancer treatment.  Fecal occult blood test (FOBT) of stool. / Every year beginning at age 50 and continuing until age 75. You may not have to do this test if you get a colonoscopy every 10 years.  Flexible sigmoidoscopy** or  colonoscopy.** / Every 5 years for a flexible sigmoidoscopy or every 10 years for a colonoscopy beginning at age 50 and continuing until age 75. Screening for abdominal aortic aneurysm (AAA)  by ultrasound is recommended for people who have history of high blood pressure or who are current or former smokers. 

## 2014-03-19 LAB — LIPID PANEL
CHOL/HDL RATIO: 4.4 ratio
Cholesterol: 206 mg/dL — ABNORMAL HIGH (ref 0–200)
HDL: 47 mg/dL (ref >39)
LDL CALC: 111 mg/dL — AB (ref 0–99)
Triglycerides: 239 mg/dL — ABNORMAL HIGH (ref <150)
VLDL: 48 mg/dL — AB (ref 0–40)

## 2014-03-19 LAB — BASIC METABOLIC PANEL WITH GFR
BUN: 13 mg/dL (ref 6–23)
CHLORIDE: 99 meq/L (ref 96–112)
CO2: 29 meq/L (ref 19–32)
CREATININE: 0.9 mg/dL (ref 0.50–1.35)
Calcium: 9.7 mg/dL (ref 8.4–10.5)
GFR, Est African American: >89 mL/min
Glucose, Bld: 87 mg/dL (ref 70–99)
Potassium: 4.1 mEq/L (ref 3.5–5.3)
SODIUM: 134 meq/L — AB (ref 135–145)

## 2014-03-19 LAB — INSULIN, FASTING: INSULIN FASTING, SERUM: 3.5 u[IU]/mL (ref 2.0–19.6)

## 2014-03-19 LAB — HEPATITIS C ANTIBODY: HCV Ab: NEGATIVE

## 2014-03-19 LAB — MAGNESIUM: Magnesium: 2.1 mg/dL (ref 1.5–2.5)

## 2014-03-19 LAB — URINALYSIS, MICROSCOPIC ONLY
Bacteria, UA: NONE SEEN
CASTS: NONE SEEN
CRYSTALS: NONE SEEN
SQUAMOUS EPITHELIAL / LPF: NONE SEEN

## 2014-03-19 LAB — IRON AND TIBC
%SAT: 52 % (ref 20–55)
IRON: 176 ug/dL — AB (ref 42–165)
TIBC: 336 ug/dL (ref 215–435)
UIBC: 160 ug/dL (ref 125–400)

## 2014-03-19 LAB — HEPATITIS B CORE ANTIBODY, TOTAL: Hep B Core Total Ab: NONREACTIVE

## 2014-03-19 LAB — MICROALBUMIN / CREATININE URINE RATIO: CREATININE, URINE: 55.5 mg/dL

## 2014-03-19 LAB — HEPATIC FUNCTION PANEL
ALK PHOS: 44 U/L (ref 39–117)
ALT: 34 U/L (ref 0–53)
AST: 37 U/L (ref 0–37)
Albumin: 4.4 g/dL (ref 3.5–5.2)
BILIRUBIN TOTAL: 0.7 mg/dL (ref 0.2–1.2)
Bilirubin, Direct: 0.2 mg/dL (ref 0.0–0.3)
Indirect Bilirubin: 0.5 mg/dL (ref 0.2–1.2)
TOTAL PROTEIN: 6.8 g/dL (ref 6.0–8.3)

## 2014-03-19 LAB — HEMOGLOBIN A1C
Hgb A1c MFr Bld: 5.4 % (ref <5.7)
MEAN PLASMA GLUCOSE: 108 mg/dL (ref <117)

## 2014-03-19 LAB — HEPATITIS B SURFACE ANTIBODY,QUALITATIVE: Hep B S Ab: NEGATIVE

## 2014-03-19 LAB — TSH: TSH: 1.704 u[IU]/mL (ref 0.350–4.500)

## 2014-03-19 LAB — TESTOSTERONE: TESTOSTERONE: 756 ng/dL (ref 300–890)

## 2014-03-19 LAB — VITAMIN B12: Vitamin B-12: 512 pg/mL (ref 211–911)

## 2014-03-19 LAB — VITAMIN D 25 HYDROXY (VIT D DEFICIENCY, FRACTURES): Vit D, 25-Hydroxy: 61 ng/mL (ref 30–100)

## 2014-03-19 LAB — HEPATITIS A ANTIBODY, TOTAL: Hep A Total Ab: NONREACTIVE

## 2014-03-19 LAB — PSA: PSA: 0.32 ng/mL (ref <=4.00)

## 2014-03-20 LAB — HEPATITIS B E ANTIBODY: Hepatitis Be Antibody: NONREACTIVE

## 2014-03-23 ENCOUNTER — Other Ambulatory Visit: Payer: Self-pay | Admitting: Internal Medicine

## 2014-03-23 ENCOUNTER — Encounter (INDEPENDENT_AMBULATORY_CARE_PROVIDER_SITE_OTHER): Payer: Self-pay

## 2014-05-22 ENCOUNTER — Other Ambulatory Visit: Payer: Self-pay

## 2014-05-22 MED ORDER — TESTOSTERONE CYPIONATE 200 MG/ML IM SOLN: 400.0000 mg | INTRAMUSCULAR | Status: DC

## 2014-06-02 ENCOUNTER — Other Ambulatory Visit: Payer: Self-pay | Admitting: Emergency Medicine

## 2014-06-17 ENCOUNTER — Other Ambulatory Visit: Payer: Self-pay | Admitting: Internal Medicine

## 2014-07-07 ENCOUNTER — Ambulatory Visit (INDEPENDENT_AMBULATORY_CARE_PROVIDER_SITE_OTHER): Payer: Managed Care, Other (non HMO) | Admitting: Physician Assistant

## 2014-07-07 ENCOUNTER — Encounter: Payer: Self-pay | Admitting: Physician Assistant

## 2014-07-07 VITALS — BP 136/88 | HR 76 | Temp 98.0°F | Resp 18 | Ht 76.0 in | Wt 238.0 lb

## 2014-07-07 DIAGNOSIS — E559 Vitamin D deficiency, unspecified: Secondary | ICD-10-CM

## 2014-07-07 DIAGNOSIS — Z79899 Other long term (current) drug therapy: Secondary | ICD-10-CM

## 2014-07-07 DIAGNOSIS — N529 Male erectile dysfunction, unspecified: Secondary | ICD-10-CM

## 2014-07-07 DIAGNOSIS — E669 Obesity, unspecified: Secondary | ICD-10-CM

## 2014-07-07 DIAGNOSIS — E782 Mixed hyperlipidemia: Secondary | ICD-10-CM

## 2014-07-07 DIAGNOSIS — I10 Essential (primary) hypertension: Secondary | ICD-10-CM

## 2014-07-07 DIAGNOSIS — N528 Other male erectile dysfunction: Secondary | ICD-10-CM

## 2014-07-07 DIAGNOSIS — R7303 Prediabetes: Secondary | ICD-10-CM

## 2014-07-07 DIAGNOSIS — R7309 Other abnormal glucose: Secondary | ICD-10-CM

## 2014-07-07 DIAGNOSIS — F418 Other specified anxiety disorders: Secondary | ICD-10-CM

## 2014-07-07 LAB — CBC WITH DIFFERENTIAL/PLATELET
BASOS ABS: 0 10*3/uL (ref 0.0–0.1)
Basophils Relative: 0 % (ref 0–1)
EOS ABS: 0.1 10*3/uL (ref 0.0–0.7)
Eosinophils Relative: 1 % (ref 0–5)
HEMATOCRIT: 44.6 % (ref 39.0–52.0)
Hemoglobin: 15.3 g/dL (ref 13.0–17.0)
Lymphocytes Relative: 28 % (ref 12–46)
Lymphs Abs: 2 10*3/uL (ref 0.7–4.0)
MCH: 32 pg (ref 26.0–34.0)
MCHC: 34.3 g/dL (ref 30.0–36.0)
MCV: 93.3 fL (ref 78.0–100.0)
MPV: 11.1 fL (ref 8.6–12.4)
Monocytes Absolute: 0.4 10*3/uL (ref 0.1–1.0)
Monocytes Relative: 6 % (ref 3–12)
NEUTROS ABS: 4.7 10*3/uL (ref 1.7–7.7)
Neutrophils Relative %: 65 % (ref 43–77)
PLATELETS: 196 10*3/uL (ref 150–400)
RBC: 4.78 MIL/uL (ref 4.22–5.81)
RDW: 12.7 % (ref 11.5–15.5)
WBC: 7.3 10*3/uL (ref 4.0–10.5)

## 2014-07-07 MED ORDER — BISOPROLOL-HYDROCHLOROTHIAZIDE 10-6.25 MG PO TABS: ORAL_TABLET | ORAL | Status: DC

## 2014-07-07 MED ORDER — SERTRALINE HCL 50 MG PO TABS: ORAL_TABLET | ORAL | Status: DC

## 2014-07-07 MED ORDER — ENALAPRIL MALEATE 20 MG PO TABS: 20.0000 mg | ORAL_TABLET | Freq: Every day | ORAL | Status: DC

## 2014-07-07 MED ORDER — OMEPRAZOLE 40 MG PO CPDR: DELAYED_RELEASE_CAPSULE | ORAL | Status: DC

## 2014-07-07 MED ORDER — ALPRAZOLAM 1 MG PO TABS: ORAL_TABLET | ORAL | Status: DC

## 2014-07-07 MED ORDER — ROSUVASTATIN CALCIUM 20 MG PO TABS: 20.0000 mg | ORAL_TABLET | Freq: Every day | ORAL | Status: DC

## 2014-07-07 NOTE — Progress Notes (Signed)
Assessment and Plan:  1. Hypertension -Continue medication, monitor blood pressure at home. Continue DASH diet.  Reminder to go to the ER if any CP, SOB, nausea, dizziness, severe HA, changes vision/speech, left arm numbness and tingling and jaw pain.  2. Cholesterol -Continue diet and exercise. Check cholesterol.   3. Prediabetes  -Continue diet and exercise. Check A1C  4. Vitamin D Def - check level and continue medications.   5. Anxiety-  continue medications, will decrease zoloft to 50mg  and will due xanax mail order, stress management techniques discussed, increase water, good sleep hygiene discussed, increase exercise, and increase veggies.   6. Obesity with co morbidities-  long discussion about weight loss, diet, and exercise  7. Hypogonadism-  continue replacement therapy, check testosterone levels as needed.    Continue diet and meds as discussed. Further disposition pending results of labs. Over 30 minutes of exam, counseling, chart review, and critical decision making was performed  HPI 39 y.o. male  presents for 3 month follow up on hypertension, cholesterol, prediabetes, and vitamin D deficiency.   His blood pressure has been controlled at home, today their BP is BP: 136/88 mmHg  He does workout, has been walking on treadmill. He denies chest pain, shortness of breath, dizziness.  He is on cholesterol medication and denies myalgias. His cholesterol is not at goal. The cholesterol last visit was:   Lab Results  Component Value Date   CHOL 206* 03/18/2014   HDL 47 03/18/2014   LDLCALC 111* 03/18/2014   TRIG 239* 03/18/2014   CHOLHDL 4.4 03/18/2014    He has been working on diet and exercise for prediabetes, and denies paresthesia of the feet, polydipsia, polyuria and visual disturbances. Last A1C in the office was:  Lab Results  Component Value Date   HGBA1C 5.4 03/18/2014   Patient is on Vitamin D supplement.   Lab Results  Component Value Date   VD25OH 61  03/18/2014     BMI is Body mass index is 28.98 kg/(m^2)., he is working on diet and exercise. Wt Readings from Last 3 Encounters:  07/07/14 238 lb (107.956 kg)  03/18/14 240 lb (108.863 kg)  12/02/13 234 lb (106.142 kg)   He has a history of testosterone deficiency and is on testosterone replacement. He states that the testosterone helps with his energy, libido, muscle mass. Lab Results  Component Value Date   TESTOSTERONE 756 03/18/2014   He is on zoloft and down to 50mg  and xanax (1/2 pill BID) for anxiety which helps, states he likes the 50mg  better.  On [employer]   Current Medications:       ALPRAZolam 1 MG tablet  Commonly known as:  XANAX  TAKE 1/2 TO 1 TABLET BY MOUTH 3 TIMES A DAY AS NEEDED     bisoprolol-hydrochlorothiazide 10-6.25 MG per tablet  Commonly known as:  ZIAC  TAKE 1 TABLET DAILY FOR    BLOOD PRESSURE     cholecalciferol 1000 UNITS tablet  Commonly known as:  VITAMIN D  Take 1,000 Units by mouth daily. Take 5000 IU daily.     enalapril 20 MG tablet  Commonly known as:  VASOTEC  Take 1 tablet (20 mg total) by mouth daily.     omeprazole 40 MG capsule  Commonly known as:  PRILOSEC  TAKE 1 CAPSULE DAILY FOR   ACID REFLUX     rosuvastatin 20 MG tablet  Commonly known as:  CRESTOR  Take 1 tablet (20 mg total) by mouth daily.  sertraline 100 MG tablet  Commonly known as:  ZOLOFT  TAKE 1 TABLET EVERY NIGHT AT BEDTIME     testosterone cypionate 200 MG/ML injection  Commonly known as:  DEPOTESTOTERONE CYPIONATE  Inject 2 mLs (400 mg total) into the muscle every 14 (fourteen) days.        Medical History:  Past Medical History  Diagnosis Date  . Hyperlipidemia   . Hypertension   . Hypogonadism male   . Vitamin D deficiency   . GERD (gastroesophageal reflux disease)   . Anxiety   . Depression   . Obesity    Allergies:  Allergies  Allergen Reactions  . Citalopram   . Pravastatin   . Wellbutrin [Bupropion]      Review of Systems:   Review of Systems  Constitutional: Negative.   HENT: Negative.   Eyes: Negative.   Respiratory: Negative.   Cardiovascular: Negative.   Gastrointestinal: Negative.   Genitourinary: Negative.   Musculoskeletal: Negative.   Skin: Negative.   Neurological: Negative.   Endo/Heme/Allergies: Negative.   Psychiatric/Behavioral: Negative.     Family history- Review and unchanged Social history- Review and unchanged Physical Exam: BP 136/88 mmHg  Pulse 76  Temp(Src) 98 F (36.7 C) (Temporal)  Resp 18  Ht 6\' 4"  (1.93 m)  Wt 238 lb (107.956 kg)  BMI 28.98 kg/m2 Wt Readings from Last 3 Encounters:  07/07/14 238 lb (107.956 kg)  03/18/14 240 lb (108.863 kg)  12/02/13 234 lb (106.142 kg)   General Appearance: Well nourished, in no apparent distress. Eyes: PERRLA, EOMs, conjunctiva no swelling or erythema Sinuses: No Frontal/maxillary tenderness ENT/Mouth: Ext aud canals clear, TMs without erythema, bulging. No erythema, swelling, or exudate on post pharynx.  Tonsils not swollen or erythematous. Hearing normal.  Neck: Supple, thyroid normal.  Respiratory: Respiratory effort normal, BS equal bilaterally without rales, rhonchi, wheezing or stridor.  Cardio: RRR with no MRGs. Brisk peripheral pulses without edema.  Abdomen: Soft, + BS,  Non tender, no guarding, rebound, hernias, masses. Lymphatics: Non tender without lymphadenopathy.  Musculoskeletal: Full ROM, 5/5 strength, Normal gait Skin: Warm, dry without rashes, lesions, ecchymosis.  Neuro: Cranial nerves intact. Normal muscle tone, no cerebellar symptoms. Psych: Awake and oriented X 3, normal affect, Insight and Judgment appropriate.    Quentin Mullingollier, Conlan Miceli, PA-C 4:07 PM [facility]

## 2014-07-07 NOTE — Patient Instructions (Signed)
GETTING OFF OF PPI's    Nexium/protonix/prilosec/Omeprazole/Dexilant/Aciphex are called PPI's, they are great at healing your stomach but should only be taken for a short period of time.     Recent studies have shown that taken for a long time they  can increase the risk of osteoporosis (weakening of your bones), pneumonia, low magnesium, restless legs, Cdiff (infection that causes diarrhea), DEMENTIA and most recently kidney damage / disease / insufficiency.     Due to this information we want to try to stop the PPI but if you try to stop it abruptly this can cause rebound acid and worsening symptoms.   So this is how we want you to get off the PPI:  - Start taking the nexium/protonix/prilosec/PPI  every other day with  zantac (ranitidine) 2 x a day for 2-4 weeks  - then decrease the PPI to every 3 days while taking the zantac (ranitidine) twice a day the other  days for 2-4  Weeks  - then you can try the zantac (ranitidine) once at night or up to 2 x day as needed.  - you can continue on this once at night or stop all together  - Avoid alcohol, spicy foods, NSAIDS (aleve, ibuprofen) at this time. See foods below.   +++++++++++++++++++++++++++++++++++++++++++  Food Choices for Gastroesophageal Reflux Disease  When you have gastroesophageal reflux disease (GERD), the foods you eat and your eating habits are very important. Choosing the right foods can help ease the discomfort of GERD. WHAT GENERAL GUIDELINES DO I NEED TO FOLLOW? 1. Choose fruits, vegetables, whole grains, low-fat dairy products, and low-fat meat, fish, and poultry. 2. Limit fats such as oils, salad dressings, butter, nuts, and avocado. 3. Keep a food diary to identify foods that cause symptoms. 4. Avoid foods that cause reflux. These may be different for different people. 5. Eat frequent small meals instead of three large meals each day. 6. Eat your meals slowly, in a relaxed setting. 7. Limit fried foods. 8. Cook  foods using methods other than frying. 9. Avoid drinking alcohol. 10. Avoid drinking large amounts of liquids with your meals. 11. Avoid bending over or lying down until 2-3 hours after eating. 12.  WHAT FOODS ARE NOT RECOMMENDED? The following are some foods and drinks that may worsen your symptoms:  Vegetables Tomatoes. Tomato juice. Tomato and spaghetti sauce. Chili peppers. Onion and garlic. Horseradish. Fruits Oranges, grapefruit, and lemon (fruit and juice). Meats High-fat meats, fish, and poultry. This includes hot dogs, ribs, ham, sausage, salami, and bacon. Dairy Whole milk and chocolate milk. Sour cream. Cream. Butter. Ice cream. Cream cheese.  Beverages Coffee and tea, with or without caffeine. Carbonated beverages or energy drinks. Condiments Hot sauce. Barbecue sauce.  Sweets/Desserts Chocolate and cocoa. Donuts. Peppermint and spearmint. Fats and Oils High-fat foods, including [place] fries and potato chips. Other Vinegar. Strong spices, such as black pepper, white pepper, red pepper, cayenne, curry powder, cloves, ginger, and chili powder. Nexium/protonix/prilosec are called PPI's, they are great at healing your stomach but should only be taken for a short period of time.      Bad carbs also include fruit juice, alcohol, and sweet tea. These are empty calories that do not signal to your brain that you are full.   Please remember the good carbs are still carbs which convert into sugar. So please measure them out no more than 1/2-1 cup of rice, oatmeal, pasta, and beans  Veggies are however free foods! Pile them on.  Not all fruit is created equal. Please see the list below, the fruit at the bottom is higher in sugars than the fruit at the top. Please avoid all dried fruits.     Before you even begin to attack a weight-loss plan, it pays to remember this: You are not fat. You have fat. Losing weight isn't about blame or shame; it's simply another achievement to  accomplish. Dieting is like any other skill-you have to buckle down and work at it. As long as you act in a smart, reasonable way, you'll ultimately get where you want to be. Here are some weight loss pearls for you.  1. It's Not a Diet. It's a Lifestyle Thinking of a diet as something you're on and suffering through only for the short term doesn't work. To shed weight and keep it off, you need to make permanent changes to the way you eat. It's OK to indulge occasionally, of course, but if you cut calories temporarily and then revert to your old way of eating, you'll gain back the weight quicker than you can say yo-yo. Use it to lose it. Research shows that one of the best predictors of long-term weight loss is how many pounds you drop in the first month. For that reason, nutritionists often suggest being stricter for the first two weeks of your new eating strategy to build momentum. Cut out added sugar and alcohol and avoid unrefined carbs. After that, figure out how you can reincorporate them in a way that's healthy and maintainable.  2. There's a Right Way to Exercise Working out burns calories and fat and boosts your metabolism by building muscle. But those trying to lose weight are notorious for overestimating the number of calories they burn and underestimating the amount they take in. Unfortunately, your system is biologically programmed to hold on to extra pounds and that means when you start exercising, your body senses the deficit and ramps up its hunger signals. If you're not diligent, you'll eat everything you burn and then some. Use it to lose it. Cardio gets all the exercise glory, but strength and interval training are the real heroes. They help you build lean muscle, which in turn increases your metabolism and calorie-burning ability 3. Don't Overreact to Mild Hunger Some people have a hard time losing weight because of hunger anxiety. To them, being hungry is bad-something to be avoided at  all costs-so they carry snacks with them and eat when they don't need to. Others eat because they're stressed out or bored. While you never want to get to the point of being ravenous (that's when bingeing is likely to happen), a hunger pang, a craving, or the fact that it's 3:00 p.m. should not send you racing for the vending machine or obsessing about the energy bar in your purse. Ideally, you should put off eating until your stomach is growling and it's difficult to concentrate.  Use it to lose it. When you feel the urge to eat, use the HALT method. Ask yourself, Am I really hungry? Or am I angry or anxious, lonely or bored, or tired? If you're still not certain, try the apple test. If you're truly hungry, an apple should seem delicious; if it doesn't, something else is going on. Or you can try drinking water and making yourself busy, if you are still hungry try a healthy snack.  4. Not All Calories Are Created Equal The mechanics of weight loss are pretty simple: Take in fewer calories than you  use for energy. But the kind of food you eat makes all the difference. Processed food that's high in saturated fat and refined starch or sugar can cause inflammation that disrupts the hormone signals that tell your brain you're full. The result: You eat a lot more.  Use it to lose it. Clean up your diet. Swap in whole, unprocessed foods, including vegetables, lean protein, and healthy fats that will fill you up and give you the biggest nutritional bang for your calorie buck. In a few weeks, as your brain starts receiving regular hunger and fullness signals once again, you'll notice that you feel less hungry overall and naturally start cutting back on the amount you eat.  5. Protein, Produce, and Plant-Based Fats Are Your Weight-Loss Trinity Here's why eating the three Ps regularly will help you drop pounds. Protein fills you up. You need it to build lean muscle, which keeps your metabolism humming so that you can  torch more fat. People in a weight-loss program who ate double the recommended daily allowance for protein (about 110 grams for a 150-pound woman) lost 70 percent of their weight from fat, while people who ate the RDA lost only about 40 percent, one study found. Produce is packed with filling fiber. "It's very difficult to consume too many calories if you're eating a lot of vegetables. Example: Three cups of broccoli is a lot of food, yet only 93 calories. (Fruit is another story. It can be easy to overeat and can contain a lot of calories from sugar, so be sure to monitor your intake.) Plant-based fats like olive oil and those in avocados and nuts are healthy and extra satiating.  Use it to lose it. Aim to incorporate each of the three Ps into every meal and snack. People who eat protein throughout the day are able to keep weight off, according to a study in the American Journal of Clinical Nutrition. In addition to meat, poultry and seafood, good sources are beans, lentils, eggs, tofu, and yogurt. As for fat, keep portion sizes in check by measuring out salad dressing, oil, and nut butters (shoot for one to two tablespoons). Finally, eat veggies or a little fruit at every meal. People who did that consumed 308 fewer calories but didn't feel any hungrier than when they didn't eat more produce.  7. How You Eat Is As Important As What You Eat In order for your brain to register that you're full, you need to focus on what you're eating. Sit down whenever you eat, preferably at a table. Turn off the TV or computer, put down your phone, and look at your food. Smell it. Chew slowly, and don't put another bite on your fork until you swallow. When women ate lunch this attentively, they consumed 30 percent less when snacking later than those who listened to an audiobook at lunchtime, according to a study in the [place] Journal of Nutrition. 8. Weighing Yourself Really Works The scale provides the best evidence about  whether your efforts are paying off. Seeing the numbers tick up or down or stagnate is motivation to keep going-or to rethink your approach. A 2015 study at [facility] found that daily weigh-ins helped people lose more weight, keep it off, and maintain that loss, even after two years. Use it to lose it. Step on the scale at the same time every day for the best results. If your weight shoots up several pounds from one weigh-in to the next, don't freak out. Eating a  lot of salt the night before or having your period is the likely culprit. The number should return to normal in a day or two. It's a steady climb that you need to do something about. 9. Too Much Stress and Too Little Sleep Are Your Enemies When you're tired and frazzled, your body cranks up the production of cortisol, the stress hormone that can cause carb cravings. Not getting enough sleep also boosts your levels of ghrelin, a hormone associated with hunger, while suppressing leptin, a hormone that signals fullness and satiety. People on a diet who slept only five and a half hours a night for two weeks lost 55 percent less fat and were hungrier than those who slept eight and a half hours, according to a study in the [place] [employer] Journal. Use it to lose it. Prioritize sleep, aiming for seven hours or more a night, which research shows helps lower stress. And make sure you're getting quality zzz's. If a snoring spouse or a fidgety cat wakes you up frequently throughout the night, you may end up getting the equivalent of just four hours of sleep, according to a study from [facility] [employer]. Keep pets out of the bedroom, and use a white-noise app to drown out snoring. 10. You Will Hit a plateau-And You Can Bust Through It As you slim down, your body releases much less leptin, the fullness hormone.  If you're not strength training, start right now. Building muscle can raise your metabolism to help you overcome a plateau. To keep  your body challenged and burning calories, incorporate new moves and more intense intervals into your workouts or add another sweat session to your weekly routine. Alternatively, cut an extra 100 calories or so a day from your diet. Now that you've lost weight, your body simply doesn't need as much fuel.

## 2014-07-08 LAB — TSH: TSH: 1.546 u[IU]/mL (ref 0.350–4.500)

## 2014-07-08 LAB — HEPATIC FUNCTION PANEL
ALBUMIN: 4.2 g/dL (ref 3.5–5.2)
ALT: 29 U/L (ref 0–53)
AST: 33 U/L (ref 0–37)
Alkaline Phosphatase: 38 U/L — ABNORMAL LOW (ref 39–117)
BILIRUBIN DIRECT: 0.3 mg/dL (ref 0.0–0.3)
Indirect Bilirubin: 0.7 mg/dL (ref 0.2–1.2)
TOTAL PROTEIN: 6.9 g/dL (ref 6.0–8.3)
Total Bilirubin: 1 mg/dL (ref 0.2–1.2)

## 2014-07-08 LAB — LIPID PANEL
Cholesterol: 202 mg/dL — ABNORMAL HIGH (ref 0–200)
HDL: 48 mg/dL (ref >=40)
LDL CALC: 117 mg/dL — AB (ref 0–99)
Total CHOL/HDL Ratio: 4.2 Ratio
Triglycerides: 186 mg/dL — ABNORMAL HIGH (ref <150)
VLDL: 37 mg/dL (ref 0–40)

## 2014-07-08 LAB — HEMOGLOBIN A1C
Hgb A1c MFr Bld: 5.5 % (ref <5.7)
Mean Plasma Glucose: 111 mg/dL (ref <117)

## 2014-07-08 LAB — BASIC METABOLIC PANEL WITH GFR
BUN: 16 mg/dL (ref 6–23)
CALCIUM: 9.6 mg/dL (ref 8.4–10.5)
CHLORIDE: 98 meq/L (ref 96–112)
CO2: 26 mEq/L (ref 19–32)
Creat: 0.96 mg/dL (ref 0.50–1.35)
GFR, Est African American: >89 mL/min
GFR, Est Non African American: >89 mL/min
Glucose, Bld: 85 mg/dL (ref 70–99)
POTASSIUM: 3.9 meq/L (ref 3.5–5.3)
Sodium: 134 mEq/L — ABNORMAL LOW (ref 135–145)

## 2014-07-08 LAB — INSULIN, FASTING: INSULIN FASTING, SERUM: 3.5 u[IU]/mL (ref 2.0–19.6)

## 2014-07-08 LAB — VITAMIN D 25 HYDROXY (VIT D DEFICIENCY, FRACTURES): VIT D 25 HYDROXY: 50 ng/mL (ref 30–100)

## 2014-07-08 LAB — MAGNESIUM: Magnesium: 1.9 mg/dL (ref 1.5–2.5)

## 2014-09-15 ENCOUNTER — Other Ambulatory Visit: Payer: Self-pay | Admitting: Internal Medicine

## 2014-10-13 ENCOUNTER — Other Ambulatory Visit: Payer: Self-pay | Admitting: Internal Medicine

## 2014-10-13 ENCOUNTER — Encounter: Payer: Self-pay | Admitting: Internal Medicine

## 2014-10-13 ENCOUNTER — Ambulatory Visit (INDEPENDENT_AMBULATORY_CARE_PROVIDER_SITE_OTHER): Payer: Managed Care, Other (non HMO) | Admitting: Internal Medicine

## 2014-10-13 VITALS — BP 124/72 | HR 60 | Temp 97.3°F | Resp 16 | Ht 76.0 in | Wt 238.6 lb

## 2014-10-13 DIAGNOSIS — E782 Mixed hyperlipidemia: Secondary | ICD-10-CM | POA: Diagnosis not present

## 2014-10-13 DIAGNOSIS — E669 Obesity, unspecified: Secondary | ICD-10-CM

## 2014-10-13 DIAGNOSIS — Z79899 Other long term (current) drug therapy: Secondary | ICD-10-CM

## 2014-10-13 DIAGNOSIS — E559 Vitamin D deficiency, unspecified: Secondary | ICD-10-CM

## 2014-10-13 DIAGNOSIS — I10 Essential (primary) hypertension: Secondary | ICD-10-CM | POA: Diagnosis not present

## 2014-10-13 DIAGNOSIS — R7309 Other abnormal glucose: Secondary | ICD-10-CM

## 2014-10-13 DIAGNOSIS — E349 Endocrine disorder, unspecified: Secondary | ICD-10-CM

## 2014-10-13 DIAGNOSIS — E291 Testicular hypofunction: Secondary | ICD-10-CM | POA: Diagnosis not present

## 2014-10-13 DIAGNOSIS — Z6829 Body mass index (BMI) 29.0-29.9, adult: Secondary | ICD-10-CM

## 2014-10-13 DIAGNOSIS — R7303 Prediabetes: Secondary | ICD-10-CM

## 2014-10-13 LAB — CBC WITH DIFFERENTIAL/PLATELET
Basophils Absolute: 0 10*3/uL (ref 0.0–0.1)
Basophils Relative: 0 % (ref 0–1)
EOS PCT: 1 % (ref 0–5)
Eosinophils Absolute: 0.1 10*3/uL (ref 0.0–0.7)
HCT: 43.6 % (ref 39.0–52.0)
Hemoglobin: 14.9 g/dL (ref 13.0–17.0)
LYMPHS ABS: 1.5 10*3/uL (ref 0.7–4.0)
LYMPHS PCT: 30 % (ref 12–46)
MCH: 32.8 pg (ref 26.0–34.0)
MCHC: 34.2 g/dL (ref 30.0–36.0)
MCV: 96 fL (ref 78.0–100.0)
MONO ABS: 0.4 10*3/uL (ref 0.1–1.0)
MPV: 10.6 fL (ref 8.6–12.4)
Monocytes Relative: 7 % (ref 3–12)
Neutro Abs: 3.1 10*3/uL (ref 1.7–7.7)
Neutrophils Relative %: 62 % (ref 43–77)
Platelets: 204 10*3/uL (ref 150–400)
RBC: 4.54 MIL/uL (ref 4.22–5.81)
RDW: 12.8 % (ref 11.5–15.5)
WBC: 5 10*3/uL (ref 4.0–10.5)

## 2014-10-13 LAB — HEPATIC FUNCTION PANEL
ALBUMIN: 4.3 g/dL (ref 3.6–5.1)
ALK PHOS: 42 U/L (ref 40–115)
ALT: 26 U/L (ref 9–46)
AST: 30 U/L (ref 10–40)
Bilirubin, Direct: 0.2 mg/dL (ref <=0.2)
Indirect Bilirubin: 0.7 mg/dL (ref 0.2–1.2)
TOTAL PROTEIN: 6.5 g/dL (ref 6.1–8.1)
Total Bilirubin: 0.9 mg/dL (ref 0.2–1.2)

## 2014-10-13 LAB — BASIC METABOLIC PANEL WITH GFR
BUN: 18 mg/dL (ref 7–25)
CALCIUM: 9 mg/dL (ref 8.6–10.3)
CO2: 25 mmol/L (ref 20–31)
Chloride: 99 mmol/L (ref 98–110)
Creat: 0.99 mg/dL (ref 0.60–1.35)
GFR, Est Non African American: >89 mL/min (ref >=60)
GLUCOSE: 94 mg/dL (ref 65–99)
Potassium: 4 mmol/L (ref 3.5–5.3)
Sodium: 136 mmol/L (ref 135–146)

## 2014-10-13 LAB — LIPID PANEL
CHOLESTEROL: 163 mg/dL (ref 125–200)
HDL: 31 mg/dL — ABNORMAL LOW (ref >=40)
LDL CALC: 72 mg/dL (ref <130)
Total CHOL/HDL Ratio: 5.3 Ratio — ABNORMAL HIGH (ref <=5.0)
Triglycerides: 299 mg/dL — ABNORMAL HIGH (ref <150)
VLDL: 60 mg/dL — ABNORMAL HIGH (ref <30)

## 2014-10-13 LAB — TSH: TSH: 1.384 u[IU]/mL (ref 0.350–4.500)

## 2014-10-13 LAB — MAGNESIUM: Magnesium: 2.1 mg/dL (ref 1.5–2.5)

## 2014-10-13 NOTE — Patient Instructions (Signed)

## 2014-10-13 NOTE — Progress Notes (Signed)
Patient ID: Ian Zavala, male   DOB: Jan 14, 1976, 39 y.o.   MRN: [id number]   This very nice 39 y.o. MWM presents for 6 month follow up with Hypertension, Hyperlipidemia, Pre-Diabetes Screening, Testosterone and Vitamin D Deficiency. Other problems include GERD controlled with Prilosec and diet.      Patient is treated for HTN since age 39 in 53 & BP has been controlled at home. Today's BP: 124/72 mmHg. Patient has had no complaints of any cardiac type chest pain, palpitations, dyspnea/orthopnea/PND, dizziness, claudication, or dependent edema.     Hyperlipidemia is not controlled with diet & meds. Patient denies myalgias or other med SE's. Last Lipids werenot at goal - Cholesterol 202*; HDL 48; LDL 117*; Triglycerides 186 on 07/07/2014.     Also, the patient is expectantly screened for PreDiabetes and has had no symptoms of reactive hypoglycemia, diabetic polys, paresthesias or visual blurring.  Last A1c was 5.5% on 07/07/2014.     The patient has been on Testosterone replacement therapy since 2012 with a low level of 244.91. He doe report improved sense of well-being on the treatment.  Further, the patient also has history of Vitamin D Deficiency of 25 in 2008 and supplements vitamin D without any suspected side-effects. Last vitamin D was 50 on 07/07/2014.   Medication Sig  . ALPRAZolam  1 MG tablet TAKE 1/2 TO 1 TABLET BY MOUTH 3 TIMES A DAY AS NEEDED  . bisoprolol-hctz (ZIAC) 10-6.25 MG  TAKE 1 TABLET DAILY FOR    BLOOD PRESSURE  . VITAMIN D 1000 UNITS  Take 1,000 Units by mouth daily. Take 5000 IU daily.  . enalapril  20 MG tablet TAKE 1 TABLET BY MOUTH EVERY DAY  . omeprazole  40 MG capsule TAKE 1 CAPSULE DAILY FOR   ACID REFLUX  . rosuvastatin  20 MG tablet Take 1 tablet (20 mg total) by mouth daily.  . sertraline 50 MG tablet TAKE 1 TABLET EVERY NIGHT AT BEDTIME  . DEPO-TESTOTERONE  200 MG/ML inj Inject 2 mLs (400 mg total) into the muscle every 14 (fourteen) days.   Allergies   Allergen Reactions  . Citalopram   . Pravastatin   . Wellbutrin [Bupropion]    PMHx:   Past Medical History  Diagnosis Date  . Hyperlipidemia   . Hypertension   . Hypogonadism male   . Vitamin D deficiency   . GERD (gastroesophageal reflux disease)   . Anxiety   . Depression   . Obesity    Immunization History  Administered Date(s) Administered  . DTaP 02/21/2004  . Influenza Whole 12/19/2011  . PPD Test 01/09/2013   No past surgical history on file. FHx:    Reviewed / unchanged  SHx:    Reviewed / unchanged  Systems Review:  Constitutional: Denies fever, chills, wt changes, headaches, insomnia, fatigue, night sweats, change in appetite. Eyes: Denies redness, blurred vision, diplopia, discharge, itchy, watery eyes.  ENT: Denies discharge, congestion, post nasal drip, epistaxis, sore throat, earache, hearing loss, dental pain, tinnitus, vertigo, sinus pain, snoring.  CV: Denies chest pain, palpitations, irregular heartbeat, syncope, dyspnea, diaphoresis, orthopnea, PND, claudication or edema. Respiratory: denies cough, dyspnea, DOE, pleurisy, hoarseness, laryngitis, wheezing.  Gastrointestinal: Denies dysphagia, odynophagia, heartburn, reflux, water brash, abdominal pain or cramps, nausea, vomiting, bloating, diarrhea, constipation, hematemesis, melena, hematochezia  or hemorrhoids. Genitourinary: Denies dysuria, frequency, urgency, nocturia, hesitancy, discharge, hematuria or flank pain. Musculoskeletal: Denies arthralgias, myalgias, stiffness, jt. swelling, pain, limping or strain/sprain.  Skin: Denies pruritus, rash, hives, warts,  acne, eczema or change in skin lesion(s). Neuro: No weakness, tremor, incoordination, spasms, paresthesia or pain. Psychiatric: Denies confusion, memory loss or sensory loss. Endo: Denies change in weight, skin or hair change.  Heme/Lymph: No excessive bleeding, bruising or enlarged lymph nodes.  Physical Exam  BP 124/72   Pulse 60  Temp  97.3 F   Resp 16  Ht 6\' 4"    Wt 238 lb 9.6 oz     BMI 29.06  Appears well nourished and in no distress. Eyes: PERRLA, EOMs, conjunctiva no swelling or erythema. Sinuses: No frontal/maxillary tenderness ENT/Mouth: EAC's clear, TM's nl w/o erythema, bulging. Nares clear w/o erythema, swelling, exudates. Oropharynx clear without erythema or exudates. Oral hygiene is good. Tongue normal, non obstructing. Hearing intact.  Neck: Supple. Thyroid nl. Car 2+/2+ without bruits, nodes or JVD. Chest: Respirations nl with BS clear & equal w/o rales, rhonchi, wheezing or stridor.  Cor: Heart sounds normal w/ regular rate and rhythm without sig. murmurs, gallops, clicks, or rubs. Peripheral pulses normal and equal  without edema.  Abdomen: Soft & bowel sounds normal. Non-tender w/o guarding, rebound, hernias, masses, or organomegaly.  Lymphatics: Unremarkable.  Musculoskeletal: Full ROM all peripheral extremities, joint stability, 5/5 strength, and normal gait.  Skin: Warm, dry without exposed rashes, lesions or ecchymosis apparent.  Neuro: Cranial nerves intact, reflexes equal bilaterally. Sensory-motor testing grossly intact. Tendon reflexes grossly intact.  Pysch: Alert & oriented x 3.  Insight and judgement nl & appropriate. No ideations.  Assessment and Plan:  1. Essential hypertension  - TSH  2. Hyperlipidemia  - Lipid panel  3. Prediabetes  - Hemoglobin A1c - Insulin, random  4. Vitamin D deficiency  - Vit D  25 hydroxy   5. Obesity   6. Encounter for long-term (current) use of medications  - CBC with Differential/Platelet - BASIC METABOLIC PANEL WITH GFR - Hepatic function panel - Magnesium  7. BMI 29.0-29.9,adult   Recommended regular exercise, BP monitoring, weight control, and discussed med and SE's. Recommended labs to assess and monitor clinical status. Further disposition pending results of labs. Over 30 minutes of exam, counseling, chart review was performed

## 2014-10-14 LAB — HEMOGLOBIN A1C
Hgb A1c MFr Bld: 5.3 % (ref <5.7)
MEAN PLASMA GLUCOSE: 105 mg/dL (ref <117)

## 2014-10-14 LAB — VITAMIN D 25 HYDROXY (VIT D DEFICIENCY, FRACTURES): Vit D, 25-Hydroxy: 46 ng/mL (ref 30–100)

## 2014-10-14 LAB — TESTOSTERONE: TESTOSTERONE: 1028 ng/dL — AB (ref 300–890)

## 2014-10-14 LAB — INSULIN, RANDOM: INSULIN: 6 u[IU]/mL (ref 2.0–19.6)

## 2015-01-18 ENCOUNTER — Other Ambulatory Visit: Payer: Self-pay | Admitting: Internal Medicine

## 2015-02-02 ENCOUNTER — Ambulatory Visit (INDEPENDENT_AMBULATORY_CARE_PROVIDER_SITE_OTHER): Payer: Managed Care, Other (non HMO) | Admitting: Physician Assistant

## 2015-02-02 ENCOUNTER — Encounter: Payer: Self-pay | Admitting: Physician Assistant

## 2015-02-02 VITALS — BP 110/80 | HR 71 | Temp 98.1°F | Resp 16 | Ht 76.0 in | Wt 235.0 lb

## 2015-02-02 DIAGNOSIS — Z23 Encounter for immunization: Secondary | ICD-10-CM | POA: Diagnosis not present

## 2015-02-02 DIAGNOSIS — R7303 Prediabetes: Secondary | ICD-10-CM | POA: Diagnosis not present

## 2015-02-02 DIAGNOSIS — E559 Vitamin D deficiency, unspecified: Secondary | ICD-10-CM | POA: Diagnosis not present

## 2015-02-02 DIAGNOSIS — E782 Mixed hyperlipidemia: Secondary | ICD-10-CM

## 2015-02-02 DIAGNOSIS — Z79899 Other long term (current) drug therapy: Secondary | ICD-10-CM

## 2015-02-02 DIAGNOSIS — E669 Obesity, unspecified: Secondary | ICD-10-CM

## 2015-02-02 DIAGNOSIS — I10 Essential (primary) hypertension: Secondary | ICD-10-CM

## 2015-02-02 NOTE — Progress Notes (Signed)
Assessment and Plan:  1. Hypertension -Continue medication, monitor blood pressure at home. Continue DASH diet.  Reminder to go to the ER if any CP, SOB, nausea, dizziness, severe HA, changes vision/speech, left arm numbness and tingling and jaw pain.  2. Cholesterol -Continue diet and exercise. Check cholesterol.   3. Prediabetes  -Continue diet and exercise. Check A1C  4. Vitamin D Def - check level and continue medications.   5. Anxiety-  continue medications, continue zoloft and xanax, stress management techniques discussed, increase water, good sleep hygiene discussed, increase exercise, and increase veggies.   6. Obesity with co morbidities-  long discussion about weight loss, diet, and exercise  7. Hypogonadism-  continue replacement therapy, check testosterone levels as needed.    Continue diet and meds as discussed. Further disposition pending results of labs. Over 30 minutes of exam, counseling, chart review, and critical decision making was performed  HPI 39 y.o. male  presents for 3 month follow up on hypertension, cholesterol, prediabetes, and vitamin D deficiency.   His blood pressure has been controlled at home, today their BP is BP: 110/80 mmHg  He does workout, has been walking on treadmill. He denies chest pain, shortness of breath, dizziness.  He is on cholesterol medication and denies myalgias. His cholesterol is not at goal. The cholesterol last visit was:   Lab Results  Component Value Date   CHOL 163 10/13/2014   HDL 31* 10/13/2014   LDLCALC 72 10/13/2014   TRIG 299* 10/13/2014   CHOLHDL 5.3* 10/13/2014   Last A1C in the office was:  Lab Results  Component Value Date   HGBA1C 5.3 10/13/2014   Patient is on Vitamin D supplement.   Lab Results  Component Value Date   VD25OH 46 10/13/2014     BMI is Body mass index is 28.62 kg/(m^2)., he is working on diet and exercise. Wt Readings from Last 3 Encounters:  02/02/15 235 lb (106.595 kg)  10/13/14  238 lb 9.6 oz (108.228 kg)  07/07/14 238 lb (107.956 kg)   He has a history of testosterone deficiency and is on testosterone replacement, last injection 1 week ago. He states that the testosterone helps with his energy, libido, muscle mass. Lab Results  Component Value Date   TESTOSTERONE 1028* 10/13/2014   He is on zoloft and take 50mg  1/2 and xanax (1/2 pill BID occ) for anxiety which helps.  On [employer]   Current Medications:    Medication List       This list is accurate as of: 02/02/15  3:29 PM.  Always use your most recent med list.               ALPRAZolam 1 MG tablet  Commonly known as:  XANAX  TAKE 1/2 TO 1 TABLET BY MOUTH 3 TIMES A DAY AS NEEDED     bisoprolol-hydrochlorothiazide 10-6.25 MG tablet  Commonly known as:  ZIAC  TAKE 1 TABLET DAILY FOR    BLOOD PRESSURE     cholecalciferol 1000 UNITS tablet  Commonly known as:  VITAMIN D  Take 1,000 Units by mouth daily. Take 5000 IU daily.     enalapril 20 MG tablet  Commonly known as:  VASOTEC  TAKE 1 TABLET BY MOUTH EVERY DAY     omeprazole 40 MG capsule  Commonly known as:  PRILOSEC  TAKE 1 CAPSULE DAILY FOR   ACID REFLUX     rosuvastatin 20 MG tablet  Commonly known as:  CRESTOR  Take 1 tablet (  20 mg total) by mouth daily.     sertraline 50 MG tablet  Commonly known as:  ZOLOFT  TAKE 1 TABLET EVERY NIGHT AT BEDTIME     testosterone cypionate 200 MG/ML injection  Commonly known as:  DEPOTESTOSTERONE CYPIONATE  Inject 2 mLs (400 mg total) into the muscle every 14 (fourteen) days.        Medical History:  Past Medical History  Diagnosis Date  . Hyperlipidemia   . Hypertension   . Hypogonadism male   . Vitamin D deficiency   . GERD (gastroesophageal reflux disease)   . Anxiety   . Depression   . Obesity    Allergies:  Allergies  Allergen Reactions  . Citalopram   . Pravastatin   . Wellbutrin [Bupropion]      Review of Systems:  Review of Systems  Constitutional: Negative.   HENT:  Negative.   Eyes: Negative.   Respiratory: Negative.   Cardiovascular: Negative.   Gastrointestinal: Negative.   Genitourinary: Negative.   Musculoskeletal: Negative.   Skin: Negative.   Neurological: Negative.   Endo/Heme/Allergies: Negative.   Psychiatric/Behavioral: Negative.     Family history- Review and unchanged Social history- Review and unchanged Physical Exam: BP 110/80 mmHg  Pulse 71  Temp(Src) 98.1 F (36.7 C) (Temporal)  Resp 16  Ht  (1.93 m)  Wt 235 lb (106.595 kg)  BMI 28.62 kg/m2  SpO2 97% Wt Readings from Last 3 Encounters:  02/02/15 235 lb (106.595 kg)  10/13/14 238 lb 9.6 oz (108.228 kg)  07/07/14 238 lb (107.956 kg)   General Appearance: Well nourished, in no apparent distress. Eyes: PERRLA, EOMs, conjunctiva no swelling or erythema Sinuses: No Frontal/maxillary tenderness ENT/Mouth: Ext aud canals clear, TMs without erythema, bulging. No erythema, swelling, or exudate on post pharynx.  Tonsils not swollen or erythematous. Hearing normal.  Neck: Supple, thyroid normal.  Respiratory: Respiratory effort normal, BS equal bilaterally without rales, rhonchi, wheezing or stridor.  Cardio: RRR with no MRGs. Brisk peripheral pulses without edema.  Abdomen: Soft, + BS,  Non tender, no guarding, rebound, hernias, masses. Lymphatics: Non tender without lymphadenopathy.  Musculoskeletal: Full ROM, 5/5 strength, Normal gait Skin: Warm, dry without rashes, lesions, ecchymosis.  Neuro: Cranial nerves intact. Normal muscle tone, no cerebellar symptoms. Psych: Awake and oriented X 3, normal affect, Insight and Judgment appropriate.    Quentin Mulling, PA-C 3:28 PM [facility]

## 2015-02-02 NOTE — Patient Instructions (Signed)
Benefiber is good for constipation/diarrhea/irritable bowel syndrome, it helps with weight loss and can help lower your bad cholesterol. Please do 1-2 TBSP in the morning in water, coffee, or tea. It can take up to a month before you can see a difference with your bowel movements. It is cheapest from [employer], [employer], [employer].   Cholesterol Cholesterol is a white, waxy, fat-like substance needed by your body in small amounts. The liver makes all the cholesterol you need. Cholesterol is carried from the liver by the blood through the blood vessels. Deposits of cholesterol (plaque) may build up on blood vessel walls. These make the arteries narrower and stiffer. Cholesterol plaques increase the risk for heart attack and stroke.  You cannot feel your cholesterol level even if it is very high. The only way to know it is high is with a blood test. Once you know your cholesterol levels, you should keep a record of the test results. Work with your health care provider to keep your levels in the desired range.  WHAT DO THE RESULTS MEAN?  Total cholesterol is a rough measure of all the cholesterol in your blood.   LDL is the so-called bad cholesterol. This is the type that deposits cholesterol in the walls of the arteries. You want this level to be low.   HDL is the good cholesterol because it cleans the arteries and carries the LDL away. You want this level to be high.  Triglycerides are fat that the body can either burn for energy or store. High levels are closely linked to heart disease.  WHAT ARE THE DESIRED LEVELS OF CHOLESTEROL?  Total cholesterol below 200.   LDL below 100 for people at risk, below 70 for those at very high risk.   HDL above 50 is good, above 60 is best.   Triglycerides below 150.  HOW CAN I LOWER MY CHOLESTEROL?  Diet. Follow your diet programs as directed by your health care provider.   Choose fish or white meat chicken and turkey, roasted or baked. Limit fatty cuts  of red meat, fried foods, and processed meats, such as sausage and lunch meats.   Eat lots of fresh fruits and vegetables.  Choose whole grains, beans, pasta, potatoes, and cereals.   Use only small amounts of olive, corn, or canola oils.   Avoid butter, mayonnaise, shortening, or palm kernel oils.  Avoid foods with trans fats.   Drink skim or nonfat milk and eat low-fat or nonfat yogurt and cheeses. Avoid whole milk, cream, ice cream, egg yolks, and full-fat cheeses.   Healthy desserts include angel food cake, ginger snaps, animal crackers, hard candy, popsicles, and low-fat or nonfat frozen yogurt. Avoid pastries, cakes, pies, and cookies.   Exercise. Follow your exercise programs as directed by your health care provider.   A regular program helps decrease LDL and raise HDL.   A regular program helps with weight control.   Do things that increase your activity level like gardening, walking, or taking the stairs. Ask your health care provider about how you can be more active in your daily life.   Medicine. Take medicine only as directed by your health care provider.   Medicine may be prescribed by your health care provider to help lower cholesterol and decrease the risk for heart disease.   If you have several risk factors, you may need medicine even if your levels are normal.   This information is not intended to replace advice given to you by your   health care provider. Make sure you discuss any questions you have with your health care provider.   Document Released: 11/01/2000 Document Revised: 02/27/2014 Document Reviewed: 11/20/2012 Elsevier Interactive Patient Education 2016 [employer]  

## 2015-02-03 LAB — HEPATIC FUNCTION PANEL
ALBUMIN: 4.6 g/dL (ref 3.6–5.1)
ALT: 31 U/L (ref 9–46)
AST: 36 U/L (ref 10–40)
Alkaline Phosphatase: 47 U/L (ref 40–115)
BILIRUBIN INDIRECT: 0.6 mg/dL (ref 0.2–1.2)
Bilirubin, Direct: 0.2 mg/dL (ref <=0.2)
TOTAL PROTEIN: 7.3 g/dL (ref 6.1–8.1)
Total Bilirubin: 0.8 mg/dL (ref 0.2–1.2)

## 2015-02-03 LAB — CBC WITH DIFFERENTIAL/PLATELET
Basophils Absolute: 0 10*3/uL (ref 0.0–0.1)
Basophils Relative: 0 % (ref 0–1)
EOS PCT: 1 % (ref 0–5)
Eosinophils Absolute: 0.1 10*3/uL (ref 0.0–0.7)
HEMATOCRIT: 46.3 % (ref 39.0–52.0)
Hemoglobin: 16.3 g/dL (ref 13.0–17.0)
LYMPHS PCT: 30 % (ref 12–46)
Lymphs Abs: 1.7 10*3/uL (ref 0.7–4.0)
MCH: 32.9 pg (ref 26.0–34.0)
MCHC: 35.2 g/dL (ref 30.0–36.0)
MCV: 93.5 fL (ref 78.0–100.0)
MONO ABS: 0.4 10*3/uL (ref 0.1–1.0)
MONOS PCT: 7 % (ref 3–12)
MPV: 10.7 fL (ref 8.6–12.4)
Neutro Abs: 3.6 10*3/uL (ref 1.7–7.7)
Neutrophils Relative %: 62 % (ref 43–77)
Platelets: 202 10*3/uL (ref 150–400)
RBC: 4.95 MIL/uL (ref 4.22–5.81)
RDW: 12.7 % (ref 11.5–15.5)
WBC: 5.8 10*3/uL (ref 4.0–10.5)

## 2015-02-03 LAB — VITAMIN D 25 HYDROXY (VIT D DEFICIENCY, FRACTURES): VIT D 25 HYDROXY: 61 ng/mL (ref 30–100)

## 2015-02-03 LAB — LIPID PANEL
CHOLESTEROL: 196 mg/dL (ref 125–200)
HDL: 41 mg/dL (ref >=40)
LDL Cholesterol: 88 mg/dL (ref <130)
TRIGLYCERIDES: 336 mg/dL — AB (ref <150)
Total CHOL/HDL Ratio: 4.8 Ratio (ref <=5.0)
VLDL: 67 mg/dL — ABNORMAL HIGH (ref <30)

## 2015-02-03 LAB — BASIC METABOLIC PANEL WITH GFR
BUN: 13 mg/dL (ref 7–25)
CALCIUM: 9.5 mg/dL (ref 8.6–10.3)
CO2: 28 mmol/L (ref 20–31)
CREATININE: 1.01 mg/dL (ref 0.60–1.35)
Chloride: 98 mmol/L (ref 98–110)
GLUCOSE: 78 mg/dL (ref 65–99)
Potassium: 4.2 mmol/L (ref 3.5–5.3)
Sodium: 137 mmol/L (ref 135–146)

## 2015-02-03 LAB — TSH: TSH: 2.191 u[IU]/mL (ref 0.350–4.500)

## 2015-02-03 LAB — MAGNESIUM: Magnesium: 2 mg/dL (ref 1.5–2.5)

## 2015-04-06 ENCOUNTER — Encounter: Payer: Self-pay | Admitting: Internal Medicine

## 2015-04-06 ENCOUNTER — Ambulatory Visit (INDEPENDENT_AMBULATORY_CARE_PROVIDER_SITE_OTHER): Payer: Managed Care, Other (non HMO) | Admitting: Internal Medicine

## 2015-04-06 VITALS — BP 126/82 | HR 60 | Temp 97.3°F | Resp 16 | Ht 76.0 in | Wt 237.6 lb

## 2015-04-06 DIAGNOSIS — Z0001 Encounter for general adult medical examination with abnormal findings: Secondary | ICD-10-CM

## 2015-04-06 DIAGNOSIS — Z1212 Encounter for screening for malignant neoplasm of rectum: Secondary | ICD-10-CM | POA: Diagnosis not present

## 2015-04-06 DIAGNOSIS — E782 Mixed hyperlipidemia: Secondary | ICD-10-CM

## 2015-04-06 DIAGNOSIS — E559 Vitamin D deficiency, unspecified: Secondary | ICD-10-CM

## 2015-04-06 DIAGNOSIS — R5383 Other fatigue: Secondary | ICD-10-CM

## 2015-04-06 DIAGNOSIS — K219 Gastro-esophageal reflux disease without esophagitis: Secondary | ICD-10-CM

## 2015-04-06 DIAGNOSIS — Z111 Encounter for screening for respiratory tuberculosis: Secondary | ICD-10-CM

## 2015-04-06 DIAGNOSIS — E669 Obesity, unspecified: Secondary | ICD-10-CM

## 2015-04-06 DIAGNOSIS — Z23 Encounter for immunization: Secondary | ICD-10-CM

## 2015-04-06 DIAGNOSIS — Z79899 Other long term (current) drug therapy: Secondary | ICD-10-CM

## 2015-04-06 DIAGNOSIS — F418 Other specified anxiety disorders: Secondary | ICD-10-CM

## 2015-04-06 DIAGNOSIS — I10 Essential (primary) hypertension: Secondary | ICD-10-CM | POA: Diagnosis not present

## 2015-04-06 DIAGNOSIS — Z0000 Encounter for general adult medical examination without abnormal findings: Secondary | ICD-10-CM

## 2015-04-06 DIAGNOSIS — N529 Male erectile dysfunction, unspecified: Secondary | ICD-10-CM

## 2015-04-06 DIAGNOSIS — R7303 Prediabetes: Secondary | ICD-10-CM

## 2015-04-06 LAB — CBC WITH DIFFERENTIAL/PLATELET
BASOS ABS: 0 10*3/uL (ref 0.0–0.1)
BASOS PCT: 0 % (ref 0–1)
Eosinophils Absolute: 0.1 10*3/uL (ref 0.0–0.7)
Eosinophils Relative: 1 % (ref 0–5)
HEMATOCRIT: 44.6 % (ref 39.0–52.0)
HEMOGLOBIN: 15.1 g/dL (ref 13.0–17.0)
LYMPHS PCT: 36 % (ref 12–46)
Lymphs Abs: 2 10*3/uL (ref 0.7–4.0)
MCH: 32.3 pg (ref 26.0–34.0)
MCHC: 33.9 g/dL (ref 30.0–36.0)
MCV: 95.3 fL (ref 78.0–100.0)
MPV: 11.4 fL (ref 8.6–12.4)
Monocytes Absolute: 0.4 10*3/uL (ref 0.1–1.0)
Monocytes Relative: 8 % (ref 3–12)
NEUTROS ABS: 3 10*3/uL (ref 1.7–7.7)
NEUTROS PCT: 55 % (ref 43–77)
Platelets: 211 10*3/uL (ref 150–400)
RBC: 4.68 MIL/uL (ref 4.22–5.81)
RDW: 12.6 % (ref 11.5–15.5)
WBC: 5.5 10*3/uL (ref 4.0–10.5)

## 2015-04-06 NOTE — Patient Instructions (Signed)
Recommend Adult Low Dose Aspirin or   coated  Aspirin 81 mg daily   To reduce risk of Colon Cancer 20 %,   Skin Cancer 26 % ,   Melanoma 46%   and   Pancreatic cancer 60%   ++++++++++++++++++++++++++++++++++++++++++++++++++++++ Vitamin D goal   is between 70-100.   Please make sure that you are taking your Vitamin D as directed.   It is very important as a natural anti-inflammatory   helping hair, skin, and nails, as well as reducing stroke and heart attack risk.   It helps your bones and helps with mood.  It also decreases numerous cancer risks so please take it as directed.   Low Vit D is associated with a 200-300% higher risk for CANCER   and 200-300% higher risk for HEART   ATTACK  &  STROKE.   .....................................Marland Kitchen  It is also associated with higher death rate at younger ages,   autoimmune diseases like Rheumatoid arthritis, Lupus, Multiple Sclerosis.     Also many other serious conditions, like depression, Alzheimer's  Dementia, infertility, muscle aches, fatigue, fibromyalgia - just to name a few.  ++++++++++++++++++++++++++++++++++++++++++++++++  Recommend the book "The END of DIETING" by Dr Excell Seltzer   & the book "The END of DIABETES " by Dr Excell Seltzer  At [email or web address] - get book & Audio CD's     Being diabetic has a  300% increased risk for heart attack, stroke, cancer, and alzheimer- type vascular dementia. It is very important that you work harder with diet by avoiding all foods that are white. Avoid white rice (brown & wild rice is OK), white potatoes (sweetpotatoes in moderation is OK), White bread or wheat bread or anything made out of white flour like bagels, donuts, rolls, buns, biscuits, cakes, pastries, cookies, pizza crust, and pasta (made from white flour & egg whites) - vegetarian pasta or spinach or wheat pasta is OK. Multigrain breads like Arnold's or Pepperidge [employer], or multigrain sandwich thins or flatbreads.  Diet,  exercise and weight loss can reverse and cure diabetes in the early stages.  Diet, exercise and weight loss is very important in the control and prevention of complications of diabetes which affects every system in your body, ie. Brain - dementia/stroke, eyes - glaucoma/blindness, heart - heart attack/heart failure, kidneys - dialysis, stomach - gastric paralysis, intestines - malabsorption, nerves - severe painful neuritis, circulation - gangrene & loss of a leg(s), and finally cancer and Alzheimers.    I recommend avoid fried & greasy foods,  sweets/candy, white rice (brown or wild rice or Quinoa is OK), white potatoes (sweet potatoes are OK) - anything made from white flour - bagels, doughnuts, rolls, buns, biscuits,white and wheat breads, pizza crust and traditional pasta made of white flour & egg white(vegetarian pasta or spinach or wheat pasta is OK).  Multi-grain bread is OK - like multi-grain flat bread or sandwich thins. Avoid alcohol in excess. Exercise is also important.    Eat all the vegetables you want - avoid meat, especially red meat and dairy - especially cheese.  Cheese is the most concentrated form of trans-fats which is the worst thing to clog up our arteries. Veggie cheese is OK which can be found in the fresh produce section at [employer] or [employer] or [employer]  ++++++++++++++++++++++++++++++++++++++++++++++++++ DASH Eating Plan  DASH stands for "Dietary Approaches to Stop Hypertension."   The DASH eating plan is a healthy eating plan that has been shown to reduce high blood  pressure (hypertension). Additional health benefits may include reducing the risk of type 2 diabetes mellitus, heart disease, and stroke. The DASH eating plan may also help with weight loss.  WHAT DO I NEED TO KNOW ABOUT THE DASH EATING PLAN?  For the DASH eating plan, you will follow these general guidelines:  Choose foods with a percent daily value for sodium of less than 5% (as listed on the food  label).  Use salt-free seasonings or herbs instead of table salt or sea salt.  Check with your health care provider or pharmacist before using salt substitutes.  Eat lower-sodium products, often labeled as "lower sodium" or "no salt added."  Eat fresh foods.  Eat more vegetables, fruits, and low-fat dairy products.    Choose whole grains. Look for the word "whole" as the first word in the ingredient list.  Choose fish   Limit sweets, desserts, sugars, and sugary drinks.  Choose heart-healthy fats.  Eat veggie cheese   Eat more home-cooked food and less restaurant, buffet, and fast food.  Limit fried foods.  Huffaker foods using methods other than frying.  Limit canned vegetables. If you do use them, rinse them well to decrease the sodium.  When eating at a restaurant, ask that your food be prepared with less salt, or no salt if possible.                      WHAT FOODS CAN I EAT?  Read Dr Fara Olden Fuhrman's books on The End of Dieting & The End of Diabetes  Grains  Whole grain or whole wheat bread. Brown rice. Whole grain or whole wheat pasta. Quinoa, bulgur, and whole grain cereals. Low-sodium cereals. Corn or whole wheat flour tortillas. Whole grain cornbread. Whole grain crackers. Low-sodium crackers.  Vegetables  Fresh or frozen vegetables (raw, steamed, roasted, or grilled). Low-sodium or reduced-sodium tomato and vegetable juices. Low-sodium or reduced-sodium tomato sauce and paste. Low-sodium or reduced-sodium canned vegetables.   Fruits  All fresh, canned (in natural juice), or frozen fruits.  Protein Products   All fish and seafood.  Dried beans, peas, or lentils. Unsalted nuts and seeds. Unsalted canned beans.  Dairy  Low-fat dairy products, such as skim or 1% milk, 2% or reduced-fat cheeses, low-fat ricotta or cottage cheese, or plain low-fat yogurt. Low-sodium or reduced-sodium cheeses.  Fats and Oils  Tub margarines without trans fats. Light or  reduced-fat mayonnaise and salad dressings (reduced sodium). Avocado. Safflower, olive, or canola oils. Natural peanut or almond butter.  Other  Unsalted popcorn and pretzels. The items listed above may not be a complete list of recommended foods or beverages. Contact your dietitian for more options.  +++++++++++++++++++++++++++++++++++++++++++  WHAT FOODS ARE NOT RECOMMENDED?  Grains/ White flour or wheat flour  White bread. White pasta. White rice. Refined cornbread. Bagels and croissants. Crackers that contain trans fat.  Vegetables  Creamed or fried vegetables. Vegetables in a . Regular canned vegetables. Regular canned tomato sauce and paste. Regular tomato and vegetable juices.  Fruits  Dried fruits. Canned fruit in light or heavy syrup. Fruit juice.  Meat and Other Protein Products  Meat in general - RED mwaet & White meat.  Fatty cuts of meat. Ribs, chicken wings, bacon, sausage, bologna, salami, chitterlings, fatback, hot dogs, bratwurst, and packaged luncheon meats.  Dairy  Whole or 2% milk, cream, half-and-half, and cream cheese. Whole-fat or sweetened yogurt. Full-fat cheeses or blue cheese. Nondairy creamers and whipped toppings. Processed cheese, cheese spreads, or  cheese curds.  Condiments  Onion and garlic salt, seasoned salt, table salt, and sea salt. Canned and packaged gravies. Worcestershire sauce. Tartar sauce. Barbecue sauce. Teriyaki sauce. Soy sauce, including reduced sodium. Steak sauce. Fish sauce. Oyster sauce. Cocktail sauce. Horseradish. Ketchup and mustard. Meat flavorings and tenderizers. Bouillon cubes. Hot sauce. Tabasco sauce. Marinades. Taco seasonings. Relishes.  Fats and Oils Butter, stick margarine, lard, shortening and bacon fat. Coconut, palm kernel, or palm oils. Regular salad dressings.  Pickles and olives. Salted popcorn and pretzels.  The items listed above may not be a complete list of foods and beverages to  avoid.  ++++++++++++++++++++++++++++++++++++++  Preventive Care for Adults  A healthy lifestyle and preventive care can promote health and wellness. Preventive health guidelines for men include the following key practices:  A routine yearly physical is a good way to check with your health care provider about your health and preventative screening. It is a chance to share any concerns and updates on your health and to receive a thorough exam.  Visit your dentist for a routine exam and preventative care every 6 months. Brush your teeth twice a day and floss once a day. Good oral hygiene prevents tooth decay and gum disease.  The frequency of eye exams is based on your age, health, family medical history, use of contact lenses, and other factors. Follow your health care provider's recommendations for frequency of eye exams.  Eat a healthy diet. Foods such as vegetables, fruits, whole grains, low-fat dairy products, and lean protein foods contain the nutrients you need without too many calories. Decrease your intake of foods high in solid fats, added sugars, and salt. Eat the right amount of calories for you.Get information about a proper diet from your health care provider, if necessary.  Regular physical exercise is one of the most important things you can do for your health. Most adults should get at least 150 minutes of moderate-intensity exercise (any activity that increases your heart rate and causes you to sweat) each week. In addition, most adults need muscle-strengthening exercises on 2 or more days a week.  Maintain a healthy weight. The body mass index (BMI) is a screening tool to identify possible weight problems. It provides an estimate of body fat based on height and weight. Your health care provider can find your BMI and can help you achieve or maintain a healthy weight.For adults 20 years and older:  A BMI below 18.5 is considered underweight.  A BMI of 18.5 to 24.9 is  normal.  A BMI of 25 to 29.9 is considered overweight.  A BMI of 30 and above is considered obese.  Maintain normal blood lipids and cholesterol levels by exercising and minimizing your intake of saturated fat. Eat a balanced diet with plenty of fruit and vegetables. Blood tests for lipids and cholesterol should begin at age 20 and be repeated every 5 years. If your lipid or cholesterol levels are high, you are over 50, or you are at high risk for heart disease, you may need your cholesterol levels checked more frequently.Ongoing high lipid and cholesterol levels should be treated with medicines if diet and exercise are not working.  If you smoke, find out from your health care provider how to quit. If you do not use tobacco, do not start.  Lung cancer screening is recommended for adults aged 55-80 years who are at high risk for developing lung cancer because of a history of smoking. A yearly low-dose CT scan of the lungs   is recommended for people who have at least a 30-pack-year history of smoking and are a current smoker or have quit within the past 15 years. A pack year of smoking is smoking an average of 1 pack of cigarettes a day for 1 year (for example: 1 pack a day for 30 years or 2 packs a day for 15 years). Yearly screening should continue until the smoker has stopped smoking for at least 15 years. Yearly screening should be stopped for people who develop a health problem that would prevent them from having lung cancer treatment.  If you choose to drink alcohol, do not have more than 2 drinks per day. One drink is considered to be 12 ounces (355 mL) of beer, 5 ounces (148 mL) of wine, or 1.5 ounces (44 mL) of liquor.  High blood pressure causes heart disease and increases the risk of stroke. Your blood pressure should be checked. Ongoing high blood pressure should be treated with medicines, if weight loss and exercise are not effective.  If you are 45-79 years old, ask your health care  provider if you should take aspirin to prevent heart disease.  Diabetes screening involves taking a blood sample to check your fasting blood sugar level. Testing should be considered at a younger age or be carried out more frequently if you are overweight and have at least 1 risk factor for diabetes.  Colorectal cancer can be detected and often prevented. Most routine colorectal cancer screening begins at the age of 50 and continues through age 75. However, your health care provider may recommend screening at an earlier age if you have risk factors for colon cancer. On a yearly basis, your health care provider may provide home test kits to check for hidden blood in the stool. Use of a small camera at the end of a tube to directly examine the colon (sigmoidoscopy or colonoscopy) can detect the earliest forms of colorectal cancer. Talk to your health care provider about this at age 50, when routine screening begins. Direct exam of the colon should be repeated every 5-10 years through age 75, unless early forms of precancerous polyps or small growths are found.  Screening for abdominal aortic aneurysm (AAA)  are recommended for persons over age 50 who have history of hypertensionor who are current or former smokers.  Talk with your health care provider about prostate cancer screening.  Testicular cancer screening is recommended for adult males. Screening includes self-exam, a health care provider exam, and other screening tests. Consult with your health care provider about any symptoms you have or any concerns you have about testicular cancer.  Use sunscreen. Apply sunscreen liberally and repeatedly throughout the day. You should seek shade when your shadow is shorter than you. Protect yourself by wearing long sleeves, pants, a wide-brimmed hat, and sunglasses year round, whenever you are outdoors.  Once a month, do a whole-body skin exam, using a mirror to look at the skin on your back. Tell your health  care provider about new moles, moles that have irregular borders, moles that are larger than a pencil eraser, or moles that have changed in shape or color.  Stay current with required vaccines (immunizations).  Influenza vaccine. All adults should be immunized every year.  Tetanus, diphtheria, and acellular pertussis (Td, Tdap) vaccine. An adult who has not previously received Tdap or who does not know his vaccine status should receive 1 dose of Tdap. This initial dose should be followed by tetanus and diphtheria toxoids (Td)   booster doses every 10 years. Adults with an unknown or incomplete history of completing a 3-dose immunization series with Td-containing vaccines should begin or complete a primary immunization series including a Tdap dose. Adults should receive a Td booster every 10 years.  Zoster vaccine. One dose is recommended for adults aged 60 years or older unless certain conditions are present.    Pneumococcal 13-valent conjugate (PCV13) vaccine. When indicated, a person who is uncertain of his immunization history and has no record of immunization should receive the PCV13 vaccine. An adult aged 19 years or older who has certain medical conditions and has not been previously immunized should receive 1 dose of PCV13 vaccine. This PCV13 should be followed with a dose of pneumococcal polysaccharide (PPSV23) vaccine. The PPSV23 vaccine dose should be obtained at least 8 weeks after the dose of PCV13 vaccine. An adult aged 19 years or older who has certain medical conditions and previously received 1 or more doses of PPSV23 vaccine should receive 1 dose of PCV13. The PCV13 vaccine dose should be obtained 1 or more years after the last PPSV23 vaccine dose.    Pneumococcal polysaccharide (PPSV23) vaccine. When PCV13 is also indicated, PCV13 should be obtained first. All adults aged 65 years and older should be immunized. An adult younger than age 65 years who has certain medical conditions  should be immunized. Any person who resides in a nursing home or long-term care facility should be immunized. An adult smoker should be immunized. People with an immunocompromised condition and certain other conditions should receive both PCV13 and PPSV23 vaccines. People with human immunodeficiency virus (HIV) infection should be immunized as soon as possible after diagnosis. Immunization during chemotherapy or radiation therapy should be avoided. Routine use of PPSV23 vaccine is not recommended for American Indians, [place] Natives, or people younger than 65 years unless there are medical conditions that require PPSV23 vaccine. When indicated, people who have unknown immunization and have no record of immunization should receive PPSV23 vaccine. One-time revaccination 5 years after the first dose of PPSV23 is recommended for people aged 19-64 years who have chronic kidney failure, nephrotic syndrome, asplenia, or immunocompromised conditions. People who received 1-2 doses of PPSV23 before age 65 years should receive another dose of PPSV23 vaccine at age 65 years or later if at least 5 years have passed since the previous dose. Doses of PPSV23 are not needed for people immunized with PPSV23 at or after age 65 years.  Hepatitis A vaccine. Adults who wish to be protected from this disease, have certain high-risk conditions, work with hepatitis A-infected animals, work in hepatitis A research labs, or travel to or work in countries with a high rate of hepatitis A should be immunized. Adults who were previously unvaccinated and who anticipate close contact with an international adoptee during the first 60 days after arrival in the [place] from a country with a high rate of hepatitis A should be immunized.  Hepatitis B vaccine. Adults should be immunized if they wish to be protected from this disease, have certain high-risk conditions, may be exposed to blood or other infectious body fluids, are household  contacts or sex partners of hepatitis B positive people, are clients or workers in certain care facilities, or travel to or work in countries with a high rate of hepatitis B.  Preventive Service / Frequency  Ages 19 to 39  Blood pressure check.  Lipid and cholesterol check.  Hepatitis C blood test.** / For any individual with known risks   for hepatitis C.  Skin self-exam. / Monthly.  Influenza vaccine. / Every year.  Tetanus, diphtheria, and acellular pertussis (Tdap, Td) vaccine.** / Consult your health care provider. 1 dose of Td every 10 years.  HPV vaccine. / 3 doses over 6 months, if 26 or younger.  Measles, mumps, rubella (MMR) vaccine.** / You need at least 1 dose of MMR if you were born in 1957 or later. You may also need a second dose.  Pneumococcal 13-valent conjugate (PCV13) vaccine.** / Consult your health care provider.  Pneumococcal polysaccharide (PPSV23) vaccine.** / 1 to 2 doses if you smoke cigarettes or if you have certain conditions.  Meningococcal vaccine.** / 1 dose if you are age 19 to 21 years and a first-year college student living in a residence hall, or have one of several medical conditions. You may also need additional booster doses.  Hepatitis A vaccine.** / Consult your health care provider.  Hepatitis B vaccine.** / Consult your health care provider.   

## 2015-04-06 NOTE — Progress Notes (Signed)
Patient ID: Ian Zavala, male   DOB: 12/06/1975, 40 y.o.   MRN: [id number]  Annual  Screening/Preventative Visit And Comprehensive Evaluation & Examination  This very nice 40 y.o. MWM  presents for a Wellness/Preventative Visit & comprehensive evaluation and management of multiple medical co-morbidities.  Patient has been followed for HTN, T2_NIDDM  Prediabetes, Hyperlipidemia, low Testosterone  and Vitamin D Deficiency.   HTN predates since age 89 in  29. Patient's BP has been controlled at home.Today's BP: 126/82 mmHg. Patient denies any cardiac symptoms as chest pain, palpitations, shortness of breath, dizziness or ankle swelling.   Patient's hyperlipidemia is controlled with diet and medications. Patient denies myalgias or other medication SE's. Last lipids were at goal with Cholesterol 196; HDL 41; LDL 88; but elevated Triglycerides 336 on 02/02/2015.    Patient is screened proactively for  prediabetes  and patient denies reactive hypoglycemic symptoms, visual blurring, diabetic polys or paresthesias. Last A1c was  5.3% on 10/13/2014.   In 2012 , patient was found to have Low T of 244.91 and has been on replacement therapy since. Finally, patient has history of Vitamin D Deficiency of of "25" in 2008 and last vitamin D was 61 on 02/02/2015.    Medication Sig  . ALPRAZolam  1 MG  TAKE 1/2 TO 1 TABLET BY MOUTH 3 TIMES A DAY AS NEEDED  . bisoprolol-hctze (ZIAC) 10-6.25  TAKE 1 TABLET DAILY FOR    BLOOD PRESSURE  . VITAMIN D1000 UNITS  Take 1,000 Units by mouth daily. Take 5000 IU daily.  . enalapril 20 MG tablet TAKE 1 TABLET BY MOUTH EVERY DAY  . omeprazole  40 MG capsule TAKE 1 CAPSULE DAILY FOR   ACID REFLUX  . rosuvastatin  20 MG Take 1 tablet (20 mg total) by mouth daily.  Marland Kitchen testosterone cypio  200 MG/ML inj Inject 2 mLs (400 mg total) into the muscle every 14 (fourteen) days.   Allergies  Allergen Reactions  . Citalopram   . Pravastatin   . Wellbutrin [Bupropion]    Past  Medical History  Diagnosis Date  . Hyperlipidemia   . Hypertension   . Hypogonadism male   . Vitamin D deficiency   . GERD (gastroesophageal reflux disease)   . Anxiety   . Depression   . Obesity    Health Maintenance  Topic Date Due  . TETANUS/TDAP  12/12/1994  . INFLUENZA VACCINE  09/21/2015  . HIV Screening  Completed   Immunization History  Administered Date(s) Administered  . Influenza Whole 12/19/2011  . Influenza, Seasonal, Injecte, Preservative Fre 02/02/2015  . PPD Test 01/09/2013  . Td 02/21/2004   No past surgical history on file. Family History  Problem Relation Age of Onset  . Hypertension Mother   . Depression Mother   . Fibromyalgia Mother   . Depression Brother   . Hypertension Brother   . Cancer Father 10    urethral/lung cancer    Social History   Social History  . Marital Status: Married    Spouse Name: N/A  . Number of Children: N/A  . Years of Education: N/A   Occupational History  . Sells tickets at [employer]   Social History Main Topics  . Smoking status: Never Smoker   . Smokeless tobacco: Never Used  . Alcohol Use: 6.0 oz/week    12 drink(s) per week  . Drug Use: No  . Sexual Activity: Yes   ROS Constitutional: Denies fever, chills, weight loss/gain, headaches, insomnia,  night sweats or change in appetite. Does c/o fatigue. Eyes: Denies redness, blurred vision, diplopia, discharge, itchy or watery eyes.  ENT: Denies discharge, congestion, post nasal drip, epistaxis, sore throat, earache, hearing loss, dental pain, Tinnitus, Vertigo, Sinus pain or snoring.  Cardio: Denies chest pain, palpitations, irregular heartbeat, syncope, dyspnea, diaphoresis, orthopnea, PND, claudication or edema Respiratory: denies cough, dyspnea, DOE, pleurisy, hoarseness, laryngitis or wheezing.  Gastrointestinal: Denies dysphagia, heartburn, reflux, water brash, pain, cramps, nausea, vomiting, bloating, diarrhea, constipation, hematemesis, melena,  hematochezia, jaundice or hemorrhoids Genitourinary: Denies dysuria, frequency, urgency, nocturia, hesitancy, discharge, hematuria or flank pain Musculoskeletal: Denies arthralgia, myalgia, stiffness, Jt. Swelling, pain, limp or strain/sprain. Denies Falls. Skin: Denies puritis, rash, hives, warts, acne, eczema or change in skin lesion Neuro: No weakness, tremor, incoordination, spasms, paresthesia or pain Psychiatric: Denies confusion, memory loss or sensory loss. Denies Depression. Endocrine: Denies change in weight, skin, hair change, nocturia, and paresthesia, diabetic polys, visual blurring or hyper / hypo glycemic episodes.  Heme/Lymph: No excessive bleeding, bruising or enlarged lymph nodes.  Physical Exam  BP 126/82 mmHg  Pulse 60  Temp(Src) 97.3 F (36.3 C)  Resp 16  Ht  (1.93 m)  Wt 237 lb 9.6 oz (107.775 kg)  BMI 28.93 kg/m2  General Appearance: Well nourished, in no apparent distress. Eyes: PERRLA, EOMs, conjunctiva no swelling or erythema, normal fundi and vessels. Sinuses: No frontal/maxillary tenderness ENT/Mouth: EACs patent / TMs  nl. Nares clear without erythema, swelling, mucoid exudates. Oral hygiene is good. No erythema, swelling, or exudate. Tongue normal, non-obstructing. Tonsils not swollen or erythematous. Hearing normal.  Neck: Supple, thyroid normal. No bruits, nodes or JVD. Respiratory: Respiratory effort normal.  BS equal and clear bilateral without rales, rhonci, wheezing or stridor. Cardio: Heart sounds are normal with regular rate and rhythm and no murmurs, rubs or gallops. Peripheral pulses are normal and equal bilaterally without edema. No aortic or femoral bruits. Chest: symmetric with normal excursions and percussion.  Abdomen: Soft, with Nl bowel sounds. Nontender, no guarding, rebound, hernias, masses, or organomegaly.  Lymphatics: Non tender without lymphadenopathy.  Genitourinary: No hernias.Testes nl. DRE - prostate nl for age - smooth &  firm w/o nodules. Musculoskeletal: Full ROM all peripheral extremities, joint stability, 5/5 strength, and normal gait. Skin: Warm and dry without rashes, lesions, cyanosis, clubbing or  ecchymosis.  Neuro: Cranial nerves intact, reflexes equal bilaterally. Normal muscle tone, no cerebellar symptoms. Sensation intact.  Pysch: Alert and oriented X 3 with normal affect, insight and judgment appropriate.   Assessment and Plan  1. Annual Preventative/Screening Exam   - Microalbumin / creatinine urine ratio - EKG 12-Lead - [place], RETROPERITNL ABD,  LTD - POC Hemoccult Bld/Stl  - Urinalysis, Routine w reflex microscopic  - Vitamin B12 - Iron and TIBC - Testosterone - CBC with Differential/Platelet - BASIC METABOLIC PANEL WITH GFR - Hepatic function panel - Magnesium - Lipid panel - TSH - Hemoglobin A1c - Insulin, random - VITAMIN D 25 Hydroxy   2. Essential hypertension  - Microalbumin / creatinine urine ratio - EKG 12-Lead - TSH  3. Hyperlipidemia  - Lipid panel - TSH  4. Prediabetes  - Hemoglobin A1c - Insulin, random  5. Vitamin D deficiency  - VITAMIN D 25 Hydroxy   6. Testosterone Deficiency  - Testosterone  7. Depression with anxiety   8. Obesity (BMI 29.06)   9. Screening for rectal cancer  - POC Hemoccult Bld/Stl   10. Other fatigue  - Vitamin B12 - Iron and  TIBC - Testosterone - CBC with Differential/Platelet - TSH  11. Gastroesophageal reflux disease   12. Medication management  - Urinalysis, Routine w reflex microscopic  - CBC with Differential/Platelet - BASIC METABOLIC PANEL WITH GFR - Hepatic function panel - Magnesium   Continue prudent diet as discussed, weight control, BP monitoring, regular exercise, and medications as discussed.  Discussed med effects and SE's. Routine screening labs and tests as requested with regular follow-up as recommended. Over 40 minutes of exam, counseling, chart review and high complex critical  decision making was performed

## 2015-04-07 LAB — HEPATIC FUNCTION PANEL
ALBUMIN: 4.3 g/dL (ref 3.6–5.1)
ALK PHOS: 41 U/L (ref 40–115)
ALT: 21 U/L (ref 9–46)
AST: 32 U/L (ref 10–40)
BILIRUBIN DIRECT: 0.2 mg/dL (ref <=0.2)
BILIRUBIN INDIRECT: 0.7 mg/dL (ref 0.2–1.2)
BILIRUBIN TOTAL: 0.9 mg/dL (ref 0.2–1.2)
Total Protein: 6.6 g/dL (ref 6.1–8.1)

## 2015-04-07 LAB — MAGNESIUM: Magnesium: 2.3 mg/dL (ref 1.5–2.5)

## 2015-04-07 LAB — URINALYSIS, ROUTINE W REFLEX MICROSCOPIC
BILIRUBIN URINE: NEGATIVE
GLUCOSE, UA: NEGATIVE
HGB URINE DIPSTICK: NEGATIVE
KETONES UR: NEGATIVE
LEUKOCYTES UA: NEGATIVE
Nitrite: NEGATIVE
PH: 6 (ref 5.0–8.0)
PROTEIN: NEGATIVE
Specific Gravity, Urine: 1.012 (ref 1.001–1.035)

## 2015-04-07 LAB — BASIC METABOLIC PANEL WITH GFR
BUN: 16 mg/dL (ref 7–25)
CO2: 27 mmol/L (ref 20–31)
Calcium: 8.9 mg/dL (ref 8.6–10.3)
Chloride: 99 mmol/L (ref 98–110)
Creat: 1.1 mg/dL (ref 0.60–1.35)
GFR, EST NON AFRICAN AMERICAN: 84 mL/min (ref >=60)
Glucose, Bld: 81 mg/dL (ref 65–99)
POTASSIUM: 4.2 mmol/L (ref 3.5–5.3)
SODIUM: 136 mmol/L (ref 135–146)

## 2015-04-07 LAB — INSULIN, RANDOM: INSULIN: 3.1 u[IU]/mL (ref 2.0–19.6)

## 2015-04-07 LAB — LIPID PANEL
Cholesterol: 168 mg/dL (ref 125–200)
HDL: 32 mg/dL — AB (ref >=40)
TRIGLYCERIDES: 415 mg/dL — AB (ref <150)
Total CHOL/HDL Ratio: 5.3 Ratio — ABNORMAL HIGH (ref <=5.0)

## 2015-04-07 LAB — HEMOGLOBIN A1C
Hgb A1c MFr Bld: 5.3 % (ref <5.7)
Mean Plasma Glucose: 105 mg/dL (ref <117)

## 2015-04-07 LAB — MICROALBUMIN / CREATININE URINE RATIO
CREATININE, URINE: 97 mg/dL (ref 20–370)
MICROALB UR: 0.2 mg/dL
MICROALB/CREAT RATIO: 2 ug/mg{creat} (ref <30)

## 2015-04-07 LAB — IRON AND TIBC
%SAT: 46 % (ref 15–60)
Iron: 138 ug/dL (ref 50–180)
TIBC: 297 ug/dL (ref 250–425)
UIBC: 159 ug/dL (ref 125–400)

## 2015-04-07 LAB — VITAMIN D 25 HYDROXY (VIT D DEFICIENCY, FRACTURES): Vit D, 25-Hydroxy: 63 ng/mL (ref 30–100)

## 2015-04-07 LAB — TESTOSTERONE: TESTOSTERONE: 885 ng/dL — AB (ref 250–827)

## 2015-04-07 LAB — TSH: TSH: 1.59 mIU/L (ref 0.40–4.50)

## 2015-04-07 LAB — VITAMIN B12: Vitamin B-12: 434 pg/mL (ref 200–1100)

## 2015-04-09 ENCOUNTER — Encounter: Payer: Self-pay | Admitting: Internal Medicine

## 2015-04-11 ENCOUNTER — Other Ambulatory Visit: Payer: Self-pay | Admitting: Internal Medicine

## 2015-06-10 ENCOUNTER — Other Ambulatory Visit: Payer: Self-pay | Admitting: *Deleted

## 2015-06-10 MED ORDER — TESTOSTERONE CYPIONATE 200 MG/ML IM SOLN: 400.0000 mg | INTRAMUSCULAR | Status: DC

## 2015-06-14 ENCOUNTER — Other Ambulatory Visit: Payer: Self-pay | Admitting: *Deleted

## 2015-06-14 MED ORDER — ROSUVASTATIN CALCIUM 20 MG PO TABS: 20.0000 mg | ORAL_TABLET | Freq: Every day | ORAL | Status: DC

## 2015-07-09 ENCOUNTER — Other Ambulatory Visit: Payer: Self-pay | Admitting: Physician Assistant

## 2015-07-12 ENCOUNTER — Ambulatory Visit (INDEPENDENT_AMBULATORY_CARE_PROVIDER_SITE_OTHER): Payer: Managed Care, Other (non HMO) | Admitting: Internal Medicine

## 2015-07-12 ENCOUNTER — Encounter: Payer: Self-pay | Admitting: Internal Medicine

## 2015-07-12 VITALS — BP 118/64 | HR 102 | Temp 98.2°F | Resp 18 | Ht 76.0 in | Wt 226.0 lb

## 2015-07-12 DIAGNOSIS — I10 Essential (primary) hypertension: Secondary | ICD-10-CM | POA: Diagnosis not present

## 2015-07-12 DIAGNOSIS — E559 Vitamin D deficiency, unspecified: Secondary | ICD-10-CM | POA: Diagnosis not present

## 2015-07-12 DIAGNOSIS — E782 Mixed hyperlipidemia: Secondary | ICD-10-CM | POA: Diagnosis not present

## 2015-07-12 DIAGNOSIS — R7303 Prediabetes: Secondary | ICD-10-CM

## 2015-07-12 DIAGNOSIS — Z79899 Other long term (current) drug therapy: Secondary | ICD-10-CM

## 2015-07-12 LAB — CBC WITH DIFFERENTIAL/PLATELET
BASOS ABS: 0 {cells}/uL (ref 0–200)
BASOS PCT: 0 %
EOS ABS: 73 {cells}/uL (ref 15–500)
EOS PCT: 1 %
HCT: 46.2 % (ref 38.5–50.0)
HEMOGLOBIN: 15.6 g/dL (ref 13.2–17.1)
LYMPHS ABS: 1387 {cells}/uL (ref 850–3900)
Lymphocytes Relative: 19 %
MCH: 31.9 pg (ref 27.0–33.0)
MCHC: 33.8 g/dL (ref 32.0–36.0)
MCV: 94.5 fL (ref 80.0–100.0)
MPV: 11.5 fL (ref 7.5–12.5)
Monocytes Absolute: 584 cells/uL (ref 200–950)
Monocytes Relative: 8 %
NEUTROS ABS: 5256 {cells}/uL (ref 1500–7800)
Neutrophils Relative %: 72 %
PLATELETS: 201 10*3/uL (ref 140–400)
RBC: 4.89 MIL/uL (ref 4.20–5.80)
RDW: 12.4 % (ref 11.0–15.0)
WBC: 7.3 10*3/uL (ref 3.8–10.8)

## 2015-07-12 LAB — HEPATIC FUNCTION PANEL
ALK PHOS: 48 U/L (ref 40–115)
ALT: 19 U/L (ref 9–46)
AST: 26 U/L (ref 10–40)
Albumin: 4.4 g/dL (ref 3.6–5.1)
BILIRUBIN TOTAL: 0.7 mg/dL (ref 0.2–1.2)
Bilirubin, Direct: 0.2 mg/dL (ref <=0.2)
Indirect Bilirubin: 0.5 mg/dL (ref 0.2–1.2)
Total Protein: 6.8 g/dL (ref 6.1–8.1)

## 2015-07-12 LAB — BASIC METABOLIC PANEL WITH GFR
BUN: 19 mg/dL (ref 7–25)
CHLORIDE: 100 mmol/L (ref 98–110)
CO2: 27 mmol/L (ref 20–31)
Calcium: 9.1 mg/dL (ref 8.6–10.3)
Creat: 1.13 mg/dL (ref 0.60–1.35)
GFR, Est African American: >89 mL/min (ref >=60)
GFR, Est Non African American: 81 mL/min (ref >=60)
Glucose, Bld: 90 mg/dL (ref 65–99)
POTASSIUM: 4.4 mmol/L (ref 3.5–5.3)
SODIUM: 137 mmol/L (ref 135–146)

## 2015-07-12 LAB — LIPID PANEL
CHOL/HDL RATIO: 4.3 ratio (ref <=5.0)
Cholesterol: 139 mg/dL (ref 125–200)
HDL: 32 mg/dL — ABNORMAL LOW (ref >=40)
LDL CALC: 62 mg/dL (ref <130)
Triglycerides: 225 mg/dL — ABNORMAL HIGH (ref <150)
VLDL: 45 mg/dL — ABNORMAL HIGH (ref <30)

## 2015-07-12 LAB — TSH: TSH: 1.47 mIU/L (ref 0.40–4.50)

## 2015-07-12 NOTE — Progress Notes (Signed)
Assessment and Plan:  Hypertension:  -Continue medication,  -monitor blood pressure at home.  -Continue DASH diet.   -Reminder to go to the ER if any CP, SOB, nausea, dizziness, severe HA, changes vision/speech, left arm numbness and tingling, and jaw pain.  Cholesterol: -Continue diet and exercise.  -Check cholesterol.   Pre-diabetes: -Continue diet and exercise.  -Check A1C  Vitamin D Def: -check level -continue medications.   Anxiety and depression -discussed medications and patient would not like to try another medication current -recommended counseling -patient wants to continue exercise and working on healthy lifestyle habits.    Continue diet and meds as discussed. Further disposition pending results of labs.  HPI 40 y.o. male  presents for 3 month follow up with hypertension, hyperlipidemia, prediabetes and vitamin D.   His blood pressure has been controlled at home, today their BP is BP: 118/64 mmHg.   He does workout. He denies chest pain, shortness of breath, dizziness.  He is doing some free weights at home and is running on the treadmill too.     He is on cholesterol medication and denies myalgias. His cholesterol is at goal. The cholesterol last visit was:   Lab Results  Component Value Date   CHOL 168 04/06/2015   HDL 32* 04/06/2015   LDLCALC NOT CALC 04/06/2015   TRIG 415* 04/06/2015   CHOLHDL 5.3* 04/06/2015  He reports that he is not having any issues with muscle aches and cramping.     He has been working on diet and exercise for prediabetes, and denies foot ulcerations, hyperglycemia, hypoglycemia , increased appetite, nausea, paresthesia of the feet, polydipsia, polyuria, visual disturbances, vomiting and weight loss. Last A1C in the office was:  Lab Results  Component Value Date   HGBA1C 5.3 04/06/2015    Patient is on Vitamin D supplement.  Lab Results  Component Value Date   VD25OH 2663 04/06/2015     Patient reports that he is continuing to  have issues with anxiety and depression. He didn't like the way zoloft made him feel.  He is not really interested in counseling or trying other medications.    Current Medications:  Current Outpatient Prescriptions on File Prior to Visit  Medication Sig Dispense Refill  . ALPRAZolam (XANAX) 1 MG tablet TAKE 1/2 TO 1 TABLET BY MOUTH 3 TIMES A DAY AS NEEDED 270 tablet 0  . bisoprolol-hydrochlorothiazide (ZIAC) 10-6.25 MG tablet TAKE 1 TABLET DAILY FOR    BLOOD PRESSURE 90 tablet 1  . cholecalciferol (VITAMIN D) 1000 UNITS tablet Take 1,000 Units by mouth daily. Take 5000 IU daily.    . enalapril (VASOTEC) 20 MG tablet TAKE 1 TABLET DAILY 90 tablet 3  . EPIDUO 0.1-2.5 % gel     . omeprazole (PRILOSEC) 40 MG capsule TAKE 1 CAPSULE DAILY FOR   ACID REFLUX 90 capsule 3  . rosuvastatin (CRESTOR) 20 MG tablet Take 1 tablet (20 mg total) by mouth daily. 90 tablet 1  . testosterone cypionate (DEPOTESTOSTERONE CYPIONATE) 200 MG/ML injection Inject 2 mLs (400 mg total) into the muscle every 14 (fourteen) days. 10 mL 3   No current facility-administered medications on file prior to visit.    Medical History:  Past Medical History  Diagnosis Date  . Hyperlipidemia   . Hypertension   . Hypogonadism male   . Vitamin D deficiency   . GERD (gastroesophageal reflux disease)   . Anxiety   . Depression   . Obesity     Allergies:  Allergies  Allergen Reactions  . Citalopram   . Pravastatin   . Wellbutrin [Bupropion]      Review of Systems:  Review of Systems  Constitutional: Negative for fever, chills and malaise/fatigue.  Respiratory: Negative for cough, shortness of breath and wheezing.   Cardiovascular: Negative for chest pain, palpitations and leg swelling.  Gastrointestinal: Negative for heartburn, abdominal pain, diarrhea, constipation, blood in stool and melena.  Genitourinary: Negative.   Neurological: Negative for dizziness, sensory change and loss of consciousness.   Psychiatric/Behavioral: Negative for depression. The patient is not nervous/anxious and does not have insomnia.     Family history- Review and unchanged  Social history- Review and unchanged  Physical Exam: BP 118/64 mmHg  Pulse 102  Temp(Src) 98.2 F (36.8 C) (Temporal)  Resp 18  Ht  (1.93 m)  Wt 226 lb (102.513 kg)  BMI 27.52 kg/m2 Wt Readings from Last 3 Encounters:  07/12/15 226 lb (102.513 kg)  04/06/15 237 lb 9.6 oz (107.775 kg)  02/02/15 235 lb (106.595 kg)    General Appearance: Well nourished well developed, in no apparent distress. Eyes: PERRLA, EOMs, conjunctiva no swelling or erythema ENT/Mouth: Ear canals normal without obstruction, swelling, erythma, discharge.  TMs normal bilaterally.  Oropharynx moist, clear, without exudate, or postoropharyngeal swelling. Neck: Supple, thyroid normal,no cervical adenopathy  Respiratory: Respiratory effort normal, Breath sounds clear A&P without rhonchi, wheeze, or rale.  No retractions, no accessory usage. Cardio: RRR with no MRGs. Brisk peripheral pulses without edema.  Abdomen: Soft, + BS,  Non tender, no guarding, rebound, hernias, masses. Musculoskeletal: Full ROM, 5/5 strength, Normal gait Skin: Warm, dry without rashes, lesions, ecchymosis.  Neuro: Awake and oriented X 3, Cranial nerves intact. Normal muscle tone, no cerebellar symptoms. Psych: Normal affect, Insight and Judgment appropriate.    Terri Piedra, PA-C 2:52 PM [facility]

## 2015-07-13 LAB — HEMOGLOBIN A1C
HEMOGLOBIN A1C: 5.3 % (ref <5.7)
MEAN PLASMA GLUCOSE: 105 mg/dL

## 2015-09-13 ENCOUNTER — Encounter: Payer: Self-pay | Admitting: Internal Medicine

## 2015-09-13 ENCOUNTER — Other Ambulatory Visit: Payer: Self-pay | Admitting: Internal Medicine

## 2015-09-13 MED ORDER — VENLAFAXINE HCL ER 37.5 MG PO CP24: 37.5000 mg | ORAL_CAPSULE | Freq: Every day | ORAL | 2 refills | Status: DC

## 2015-09-24 ENCOUNTER — Encounter: Payer: Self-pay | Admitting: Internal Medicine

## 2015-09-30 ENCOUNTER — Other Ambulatory Visit: Payer: Self-pay | Admitting: Internal Medicine

## 2015-09-30 MED ORDER — VENLAFAXINE HCL ER 37.5 MG PO CP24: 37.5000 mg | ORAL_CAPSULE | Freq: Every day | ORAL | 2 refills | Status: DC

## 2015-10-18 ENCOUNTER — Ambulatory Visit (INDEPENDENT_AMBULATORY_CARE_PROVIDER_SITE_OTHER): Payer: Managed Care, Other (non HMO) | Admitting: Internal Medicine

## 2015-10-18 ENCOUNTER — Encounter: Payer: Self-pay | Admitting: Internal Medicine

## 2015-10-18 VITALS — BP 144/82 | HR 72 | Temp 97.5°F | Resp 16 | Ht 76.0 in | Wt 224.4 lb

## 2015-10-18 DIAGNOSIS — E782 Mixed hyperlipidemia: Secondary | ICD-10-CM

## 2015-10-18 DIAGNOSIS — K219 Gastro-esophageal reflux disease without esophagitis: Secondary | ICD-10-CM

## 2015-10-18 DIAGNOSIS — Z79899 Other long term (current) drug therapy: Secondary | ICD-10-CM

## 2015-10-18 DIAGNOSIS — R7303 Prediabetes: Secondary | ICD-10-CM

## 2015-10-18 DIAGNOSIS — N529 Male erectile dysfunction, unspecified: Secondary | ICD-10-CM

## 2015-10-18 DIAGNOSIS — E559 Vitamin D deficiency, unspecified: Secondary | ICD-10-CM

## 2015-10-18 DIAGNOSIS — I10 Essential (primary) hypertension: Secondary | ICD-10-CM

## 2015-10-18 LAB — HEPATIC FUNCTION PANEL
ALT: 18 U/L (ref 9–46)
AST: 24 U/L (ref 10–40)
Albumin: 4.4 g/dL (ref 3.6–5.1)
Alkaline Phosphatase: 55 U/L (ref 40–115)
BILIRUBIN DIRECT: 0.2 mg/dL (ref <=0.2)
Indirect Bilirubin: 0.3 mg/dL (ref 0.2–1.2)
TOTAL PROTEIN: 7 g/dL (ref 6.1–8.1)
Total Bilirubin: 0.5 mg/dL (ref 0.2–1.2)

## 2015-10-18 LAB — CBC WITH DIFFERENTIAL/PLATELET
BASOS PCT: 0 %
Basophils Absolute: 0 cells/uL (ref 0–200)
EOS ABS: 79 {cells}/uL (ref 15–500)
Eosinophils Relative: 1 %
HEMATOCRIT: 47.7 % (ref 38.5–50.0)
HEMOGLOBIN: 16.5 g/dL (ref 13.2–17.1)
LYMPHS PCT: 18 %
Lymphs Abs: 1422 cells/uL (ref 850–3900)
MCH: 32.7 pg (ref 27.0–33.0)
MCHC: 34.6 g/dL (ref 32.0–36.0)
MCV: 94.5 fL (ref 80.0–100.0)
MONO ABS: 395 {cells}/uL (ref 200–950)
MPV: 10.5 fL (ref 7.5–12.5)
Monocytes Relative: 5 %
Neutro Abs: 6004 cells/uL (ref 1500–7800)
Neutrophils Relative %: 76 %
Platelets: 268 10*3/uL (ref 140–400)
RBC: 5.05 MIL/uL (ref 4.20–5.80)
RDW: 12.8 % (ref 11.0–15.0)
WBC: 7.9 10*3/uL (ref 3.8–10.8)

## 2015-10-18 LAB — MAGNESIUM: MAGNESIUM: 2.1 mg/dL (ref 1.5–2.5)

## 2015-10-18 LAB — LIPID PANEL
CHOL/HDL RATIO: 4.4 ratio (ref <=5.0)
Cholesterol: 181 mg/dL (ref 125–200)
HDL: 41 mg/dL (ref >=40)
LDL Cholesterol: 105 mg/dL (ref <130)
Triglycerides: 176 mg/dL — ABNORMAL HIGH (ref <150)
VLDL: 35 mg/dL — AB (ref <30)

## 2015-10-18 LAB — BASIC METABOLIC PANEL WITH GFR
BUN: 13 mg/dL (ref 7–25)
CHLORIDE: 101 mmol/L (ref 98–110)
CO2: 26 mmol/L (ref 20–31)
CREATININE: 1.15 mg/dL (ref 0.60–1.35)
Calcium: 9.5 mg/dL (ref 8.6–10.3)
GFR, Est African American: >89 mL/min (ref >=60)
GFR, Est Non African American: 80 mL/min (ref >=60)
GLUCOSE: 85 mg/dL (ref 65–99)
POTASSIUM: 4.6 mmol/L (ref 3.5–5.3)
Sodium: 139 mmol/L (ref 135–146)

## 2015-10-18 LAB — HEMOGLOBIN A1C
HEMOGLOBIN A1C: 5 % (ref <5.7)
Mean Plasma Glucose: 97 mg/dL

## 2015-10-18 LAB — TSH: TSH: 1.67 m[IU]/L (ref 0.40–4.50)

## 2015-10-18 NOTE — Patient Instructions (Signed)

## 2015-10-18 NOTE — Progress Notes (Signed)
[facility]                       Lucky Cowboy, M.D.        Dyanne Carrel. Steffanie Dunn, P.A.-C       Terri Piedra, P.A.-C   [facility]                [address]                [place], [place]. [address] Telephone [phone] Telefax [phone] ______________________________________________________________________     This very nice 40y.o.MWM presents for 6 month follow up with Hypertension, Hyperlipidemia, Pre-Diabetes and Vitamin D Deficiency.      Patient is treated for HTN circa 40 (age 89 yo)  & BP has been controlled at home. Today's BP is 144/82. Patient has had no complaints of any cardiac type chest pain, palpitations, dyspnea/orthopnea/PND, dizziness, claudication, or dependent edema.     Hyperlipidemia is controlled with diet & meds. Patient denies myalgias or other med SE's. Last Lipids were at goal albeit elevated elev Trig's: Lab Results  Component Value Date   CHOL 139 07/12/2015   HDL 32 (L) 07/12/2015   LDLCALC 62 07/12/2015   TRIG 225 (H) 07/12/2015   CHOLHDL 4.3 07/12/2015      Also, the patient has history of Morbid Obesity and consequent PreDiabetes with A1c 5.8% in 2015 and has had no symptoms of reactive hypoglycemia, diabetic polys, paresthesias or visual blurring. Patient has lost 14 # over the over the last year which he attributes to better diet and regular exercises.  Last A1c was at goal:  Lab Results  Component Value Date   HGBA1C 5.3 07/12/2015      Further, the patient also has history of Vitamin D Deficiency of "25" in 2008 and supplements vitamin D without any suspected side-effects. Last vitamin D was at goal: Lab Results  Component Value Date   VD25OH 63 04/06/2015   Current Outpatient Prescriptions on File Prior to Visit  Medication Sig  . ALPRAZolam (XANAX) 1 MG tablet TAKE 1/2 TO 1 TABLET BY MOUTH 3 TIMES A DAY AS NEEDED  . bisoprolol-hydrochlorothiazide (ZIAC)  10-6.25 MG tablet TAKE 1 TABLET DAILY FOR    BLOOD PRESSURE  . cholecalciferol (VITAMIN D) 1000 UNITS tablet Take 1,000 Units by mouth daily. Take 5000 IU daily.  . enalapril (VASOTEC) 20 MG tablet TAKE 1 TABLET DAILY  . EPIDUO 0.1-2.5 % gel   . omeprazole (PRILOSEC) 40 MG capsule TAKE 1 CAPSULE DAILY FOR   ACID REFLUX  . rosuvastatin (CRESTOR) 20 MG tablet Take 1 tablet (20 mg total) by mouth daily.  Marland Kitchen testosterone cypionate (DEPOTESTOSTERONE CYPIONATE) 200 MG/ML injection Inject 2 mLs (400 mg total) into the muscle every 14 (fourteen) days.  Marland Kitchen venlafaxine XR (EFFEXOR XR) 37.5 MG 24 hr capsule Take 1 capsule (37.5 mg total) by mouth daily.   No current facility-administered medications on file prior to visit.    Allergies  Allergen Reactions  . Citalopram Other (See Comments)    anxiety  . Pravastatin   . Wellbutrin [Bupropion]    PMHx:   Past Medical History:  Diagnosis Date  . Anxiety   . Depression   . GERD (gastroesophageal reflux disease)   . Hyperlipidemia   . Hypertension   . Hypogonadism male   . Obesity   . Vitamin D deficiency    Immunization History  Administered Date(s) Administered  .  Influenza Whole 12/19/2011  . Influenza, Seasonal, Injecte, Preservative Fre 02/02/2015  . PPD Test 01/09/2013, 04/06/2015  . Td 02/21/2004  . Tdap 04/06/2015   No past surgical history on file. FHx:    Reviewed / unchanged  SHx:    Reviewed / unchanged  Systems Review:  Constitutional: Denies fever, chills, wt changes, headaches, insomnia, fatigue, night sweats, change in appetite. Eyes: Denies redness, blurred vision, diplopia, discharge, itchy, watery eyes.  ENT: Denies discharge, congestion, post nasal drip, epistaxis, sore throat, earache, hearing loss, dental pain, tinnitus, vertigo, sinus pain, snoring.  CV: Denies chest pain, palpitations, irregular heartbeat, syncope, dyspnea, diaphoresis, orthopnea, PND, claudication or edema. Respiratory: denies cough, dyspnea,  DOE, pleurisy, hoarseness, laryngitis, wheezing.  Gastrointestinal: Denies dysphagia, odynophagia, heartburn, reflux, water brash, abdominal pain or cramps, nausea, vomiting, bloating, diarrhea, constipation, hematemesis, melena, hematochezia  or hemorrhoids. Genitourinary: Denies dysuria, frequency, urgency, nocturia, hesitancy, discharge, hematuria or flank pain. Musculoskeletal: Denies arthralgias, myalgias, stiffness, jt. swelling, pain, limping or strain/sprain.  Skin: Denies pruritus, rash, hives, warts, acne, eczema or change in skin lesion(s). Neuro: No weakness, tremor, incoordination, spasms, paresthesia or pain. Psychiatric: Denies confusion, memory loss or sensory loss. Endo: Denies change in weight, skin or hair change.  Heme/Lymph: No excessive bleeding, bruising or enlarged lymph nodes.  Physical Exam BP (!) 144/82   Pulse 72   Temp 97.5 F (36.4 C)   Resp 16   Ht 6\' 4"  (1.93 m)   Wt 224 lb 6.4 oz (101.8 kg)   BMI 27.31 kg/m   Appears well nourished and in no distress.  Eyes: PERRLA, EOMs, conjunctiva no swelling or erythema. Sinuses: No frontal/maxillary tenderness ENT/Mouth: EAC's clear, TM's nl w/o erythema, bulging. Nares clear w/o erythema, swelling, exudates. Oropharynx clear without erythema or exudates. Oral hygiene is good. Tongue normal, non obstructing. Hearing intact.  Neck: Supple. Thyroid nl. Car 2+/2+ without bruits, nodes or JVD. Chest: Respirations nl with BS clear & equal w/o rales, rhonchi, wheezing or stridor.  Cor: Heart sounds normal w/ regular rate and rhythm without sig. murmurs, gallops, clicks, or rubs. Peripheral pulses normal and equal  without edema.  Abdomen: Soft & bowel sounds normal. Non-tender w/o guarding, rebound, hernias, masses, or organomegaly.  Lymphatics: Unremarkable.  Musculoskeletal: Full ROM all peripheral extremities, joint stability, 5/5 strength, and normal gait.  Skin: Warm, dry without exposed rashes, lesions or  ecchymosis apparent.  Neuro: Cranial nerves intact, reflexes equal bilaterally. Sensory-motor testing grossly intact. Tendon reflexes grossly intact.  Pysch: Alert & oriented x 3.  Insight and judgement nl & appropriate. No ideations.  Assessment and Plan:  1. Essential hypertension  - Continue medication, monitor blood pressure at home. Continue DASH diet. Reminder to go to the ER if any CP, SOB, nausea, dizziness, severe HA, changes vision/speech, left arm numbness and tingling and jaw pain. - TSH  2. Hyperlipidemia  - Continue diet/meds, exercise,& lifestyle modifications. Continue monitor periodic cholesterol/liver & renal functions - Lipid panel - TSH  3. Prediabetes  - Continue diet, exercise, lifestyle modifications. Monitor appropriate labs. - Hemoglobin A1c - Insulin, random  4. Vitamin D deficiency  - Continue supplementation. - VITAMIN D 25 Hydroxy   5. Testosterone Deficiency   6. Gastroesophageal reflux disease   7. Medication management  - CBC with Differential/Platelet - BASIC METABOLIC PANEL WITH GFR - Hepatic function panel - Magnesium     Recommended regular exercise, BP monitoring, weight control, and discussed med and SE's. Recommended labs to assess and monitor clinical status. Further  disposition pending results of labs. Over 30 minutes of exam, counseling, chart review was performed

## 2015-10-19 LAB — INSULIN, RANDOM: Insulin: 15.7 u[IU]/mL (ref 2.0–19.6)

## 2015-10-19 LAB — VITAMIN D 25 HYDROXY (VIT D DEFICIENCY, FRACTURES): VIT D 25 HYDROXY: 58 ng/mL (ref 30–100)

## 2015-10-22 ENCOUNTER — Other Ambulatory Visit: Payer: Self-pay | Admitting: Physician Assistant

## 2015-12-18 ENCOUNTER — Other Ambulatory Visit: Payer: Self-pay | Admitting: Internal Medicine

## 2015-12-29 ENCOUNTER — Other Ambulatory Visit: Payer: Self-pay | Admitting: Internal Medicine

## 2015-12-29 ENCOUNTER — Other Ambulatory Visit: Payer: Self-pay | Admitting: Physician Assistant

## 2015-12-29 ENCOUNTER — Encounter: Payer: Self-pay | Admitting: Internal Medicine

## 2015-12-29 MED ORDER — ALPRAZOLAM 1 MG PO TABS: ORAL_TABLET | ORAL | 1 refills | Status: DC

## 2015-12-29 MED ORDER — TESTOSTERONE CYPIONATE 200 MG/ML IM SOLN: 400.0000 mg | INTRAMUSCULAR | 1 refills | Status: DC

## 2016-01-18 ENCOUNTER — Ambulatory Visit (INDEPENDENT_AMBULATORY_CARE_PROVIDER_SITE_OTHER): Payer: Managed Care, Other (non HMO) | Admitting: Physician Assistant

## 2016-01-18 ENCOUNTER — Encounter: Payer: Self-pay | Admitting: Physician Assistant

## 2016-01-18 VITALS — BP 138/76 | HR 67 | Resp 16 | Ht 76.0 in | Wt 230.0 lb

## 2016-01-18 DIAGNOSIS — N529 Male erectile dysfunction, unspecified: Secondary | ICD-10-CM

## 2016-01-18 DIAGNOSIS — F418 Other specified anxiety disorders: Secondary | ICD-10-CM

## 2016-01-18 DIAGNOSIS — I10 Essential (primary) hypertension: Secondary | ICD-10-CM

## 2016-01-18 DIAGNOSIS — E782 Mixed hyperlipidemia: Secondary | ICD-10-CM | POA: Diagnosis not present

## 2016-01-18 DIAGNOSIS — R7303 Prediabetes: Secondary | ICD-10-CM

## 2016-01-18 DIAGNOSIS — Z79899 Other long term (current) drug therapy: Secondary | ICD-10-CM | POA: Diagnosis not present

## 2016-01-18 DIAGNOSIS — E559 Vitamin D deficiency, unspecified: Secondary | ICD-10-CM

## 2016-01-18 MED ORDER — VORTIOXETINE HBR 20 MG PO TABS: 20.0000 mg | ORAL_TABLET | Freq: Every day | ORAL | 5 refills | Status: DC

## 2016-01-18 NOTE — Patient Instructions (Signed)
Major Depressive Disorder, Adult Major depressive disorder (MDD) is a mental health condition. It may also be called clinical depression or unipolar depression. MDD usually causes feelings of sadness, hopelessness, or helplessness. MDD can also cause physical symptoms. It can interfere with work, school, relationships, and other everyday activities. MDD may be mild, moderate, or severe. It may occur once (single episode major depressive disorder) or it may occur multiple times (recurrent major depressive disorder). What are the causes? The exact cause of this condition is not known. MDD is most likely caused by a combination of things, which may include:  Genetic factors. These are traits that are passed along from parent to child.  Individual factors. Your personality, your behavior, and the way you handle your thoughts and feelings may contribute to MDD. This includes personality traits and behaviors learned from others.  Physical factors, such as:  Differences in the part of your brain that controls emotion. This part of your brain may be different than it is in people who do not have MDD.  Long-term (chronic) medical or psychiatric illnesses.  Social factors. Traumatic experiences or major life changes may play a role in the development of MDD. What increases the risk? This condition is more likely to develop in women. The following factors may also make you more likely to develop MDD:  A family history of depression.  Troubled family relationships.  Abnormally low levels of certain brain chemicals.  Traumatic events in childhood, especially abuse or the loss of a parent.  Being under a lot of stress, or long-term stress, especially from upsetting life experiences or losses.  A history of:  Chronic physical illness.  Other mental health disorders.  Substance abuse.  Poor living conditions.  Experiencing social exclusion or discrimination on a regular basis. What are the  signs or symptoms? The main symptoms of MDD typically include:  Constant depressed or irritable mood.  Loss of interest in things and activities. MDD symptoms may also include:  Sleeping or eating too much or too little.  Unexplained weight change.  Fatigue or low energy.  Feelings of worthlessness or guilt.  Difficulty thinking clearly or making decisions.  Thoughts of suicide or of harming others.  Physical agitation or weakness.  Isolation. Severe cases of MDD may also occur with other symptoms, such as:  Delusions or hallucinations, in which you imagine things that are not real (psychotic depression).  Low-level depression that lasts at least a year (chronic depression or persistent depressive disorder).  Extreme sadness and hopelessness (melancholic depression).  Trouble speaking and moving (catatonic depression). How is this diagnosed? This condition may be diagnosed based on:  Your symptoms.  Your medical history, including your mental health history. This may involve tests to evaluate your mental health. You may be asked questions about your lifestyle, including any drug and alcohol use, and how long you have had symptoms of MDD.  A physical exam.  Blood tests to rule out other conditions. You must have a depressed mood and at least four other MDD symptoms most of the day, nearly every day in the same 2-week timeframe before your health care provider can confirm a diagnosis of MDD. How is this treated? This condition is usually treated by mental health professionals, such as psychologists, psychiatrists, and clinical social workers. You may need more than one type of treatment. Treatment may include:  Psychotherapy. This is also called talk therapy or counseling. Types of psychotherapy include:  Cognitive behavioral therapy (CBT). This type of therapy  teaches you to recognize unhealthy feelings, thoughts, and behaviors, and replace them with positive thoughts  and actions.  Interpersonal therapy (IPT). This helps you to improve the way you relate to and communicate with others.  Family therapy. This treatment includes members of your family.  Medicine to treat anxiety and depression, or to help you control certain emotions and behaviors.  Lifestyle changes, such as:  Limiting alcohol and drug use.  Exercising regularly.  Getting plenty of sleep.  Making healthy eating choices.  Spending more time outdoors. Treatments involving stimulation of the brain can be used in situations with extremely severe symptoms, or when medicine or other therapies do not work over time. These treatments include electroconvulsive therapy, transcranial magnetic stimulation, and vagal nerve stimulation. Follow these instructions at home: Activity  Return to your normal activities as told by your health care provider.  Exercise regularly and spend time outdoors as told by your health care provider. General instructions  Take over-the-counter and prescription medicines only as told by your health care provider.  Do not drink alcohol. If you drink alcohol, limit your alcohol intake to no more than 1 drink a day for nonpregnant women and 2 drinks a day for men. One drink equals 12 oz of beer, 5 oz of wine, or 1 oz of hard liquor. Alcohol can affect any antidepressant medicines you are taking. Talk to your health care provider about your alcohol use.  Eat a healthy diet and get plenty of sleep.  Find activities that you enjoy doing, and make time to do them.  Consider joining a support group. Your health care provider may be able to recommend a support group.  Keep all follow-up visits as told by your health care provider. This is important. Where to find more information: [employer] on Mental Illness  www.nami.org [place] [employer] of Mental Health  http://www.maynard.net/ National Suicide Prevention Lifeline  1-800-273-TALK 928-571-6161). This is  free, 24-hour help. Contact a health care provider if:  Your symptoms get worse.  You develop new symptoms. Get help right away if:  You self-harm.  You have serious thoughts about hurting yourself or others.  You see, hear, taste, smell, or feel things that are not present (hallucinate). This information is not intended to replace advice given to you by your health care provider. Make sure you discuss any questions you have with your health care provider. Document Released: 06/03/2012 Document Revised: 10/14/2015 Document Reviewed: 08/18/2015 Elsevier Interactive Patient Education  2017 [employer].   Before you even begin to attack a weight-loss plan, it pays to remember this: You are not fat. You have fat. Losing weight isn't about blame or shame; it's simply another achievement to accomplish. Dieting is like any other skill-you have to buckle down and work at it. As long as you act in a smart, reasonable way, you'll ultimately get where you want to be. Here are some weight loss pearls for you.  1. It's Not a Diet. It's a Lifestyle Thinking of a diet as something you're on and suffering through only for the short term doesn't work. To shed weight and keep it off, you need to make permanent changes to the way you eat. It's OK to indulge occasionally, of course, but if you cut calories temporarily and then revert to your old way of eating, you'll gain back the weight quicker than you can say yo-yo. Use it to lose it. Research shows that one of the best predictors of long-term weight loss is  how many pounds you drop in the first month. For that reason, nutritionists often suggest being stricter for the first two weeks of your new eating strategy to build momentum. Cut out added sugar and alcohol and avoid unrefined carbs. After that, figure out how you can reincorporate them in a way that's healthy and maintainable.  2. There's a Right Way to Exercise Working out burns calories and fat and  boosts your metabolism by building muscle. But those trying to lose weight are notorious for overestimating the number of calories they burn and underestimating the amount they take in. Unfortunately, your system is biologically programmed to hold on to extra pounds and that means when you start exercising, your body senses the deficit and ramps up its hunger signals. If you're not diligent, you'll eat everything you burn and then some. Use it to lose it. Cardio gets all the exercise glory, but strength and interval training are the real heroes. They help you build lean muscle, which in turn increases your metabolism and calorie-burning ability 3. Don't Overreact to Mild Hunger Some people have a hard time losing weight because of hunger anxiety. To them, being hungry is bad-something to be avoided at all costs-so they carry snacks with them and eat when they don't need to. Others eat because they're stressed out or bored. While you never want to get to the point of being ravenous (that's when bingeing is likely to happen), a hunger pang, a craving, or the fact that it's 3:00 p.m. should not send you racing for the vending machine or obsessing about the energy bar in your purse. Ideally, you should put off eating until your stomach is growling and it's difficult to concentrate.  Use it to lose it. When you feel the urge to eat, use the HALT method. Ask yourself, Am I really hungry? Or am I angry or anxious, lonely or bored, or tired? If you're still not certain, try the apple test. If you're truly hungry, an apple should seem delicious; if it doesn't, something else is going on. Or you can try drinking water and making yourself busy, if you are still hungry try a healthy snack.  4. Not All Calories Are Created Equal The mechanics of weight loss are pretty simple: Take in fewer calories than you use for energy. But the kind of food you eat makes all the difference. Processed food that's high in saturated fat  and refined starch or sugar can cause inflammation that disrupts the hormone signals that tell your brain you're full. The result: You eat a lot more.  Use it to lose it. Clean up your diet. Swap in whole, unprocessed foods, including vegetables, lean protein, and healthy fats that will fill you up and give you the biggest nutritional bang for your calorie buck. In a few weeks, as your brain starts receiving regular hunger and fullness signals once again, you'll notice that you feel less hungry overall and naturally start cutting back on the amount you eat.  5. Protein, Produce, and Plant-Based Fats Are Your Weight-Loss Trinity Here's why eating the three Ps regularly will help you drop pounds. Protein fills you up. You need it to build lean muscle, which keeps your metabolism humming so that you can torch more fat. People in a weight-loss program who ate double the recommended daily allowance for protein (about 110 grams for a 150-pound woman) lost 70 percent of their weight from fat, while people who ate the RDA lost only about 40 percent,  one study found. Produce is packed with filling fiber. "It's very difficult to consume too many calories if you're eating a lot of vegetables. Example: Three cups of broccoli is a lot of food, yet only 93 calories. (Fruit is another story. It can be easy to overeat and can contain a lot of calories from sugar, so be sure to monitor your intake.) Plant-based fats like olive oil and those in avocados and nuts are healthy and extra satiating.  Use it to lose it. Aim to incorporate each of the three Ps into every meal and snack. People who eat protein throughout the day are able to keep weight off, according to a study in the American Journal of Clinical Nutrition. In addition to meat, poultry and seafood, good sources are beans, lentils, eggs, tofu, and yogurt. As for fat, keep portion sizes in check by measuring out salad dressing, oil, and nut butters (shoot for one to  two tablespoons). Finally, eat veggies or a little fruit at every meal. People who did that consumed 308 fewer calories but didn't feel any hungrier than when they didn't eat more produce.  7. How You Eat Is As Important As What You Eat In order for your brain to register that you're full, you need to focus on what you're eating. Sit down whenever you eat, preferably at a table. Turn off the TV or computer, put down your phone, and look at your food. Smell it. Chew slowly, and don't put another bite on your fork until you swallow. When women ate lunch this attentively, they consumed 30 percent less when snacking later than those who listened to an audiobook at lunchtime, according to a study in the [place] Journal of Nutrition. 8. Weighing Yourself Really Works The scale provides the best evidence about whether your efforts are paying off. Seeing the numbers tick up or down or stagnate is motivation to keep going-or to rethink your approach. A [facility] study [facility] found that daily weigh-ins helped people lose more weight, keep it off, and maintain that loss, even after two years. Use it to lose it. Step on the scale at the same time every day for the best results. If your weight shoots up several pounds from one weigh-in to the next, don't freak out. Eating a lot of salt the night before or having your period is the likely culprit. The number should return to normal in a day or two. It's a steady climb that you need to do something about. 9. Too Much Stress and Too Little Sleep Are Your Enemies When you're tired and frazzled, your body cranks up the production of cortisol, the stress hormone that can cause carb cravings. Not getting enough sleep also boosts your levels of ghrelin, a hormone associated with hunger, while suppressing leptin, a hormone that signals fullness and satiety. People on a diet who slept only five and a half hours a night for two weeks lost 55 percent less fat and were  hungrier than those who slept eight and a half hours, according to a study in the [place] [employer] Journal. Use it to lose it. Prioritize sleep, aiming for seven hours or more a night, which research shows helps lower stress. And make sure you're getting quality zzz's. If a snoring spouse or a fidgety cat wakes you up frequently throughout the night, you may end up getting the equivalent of just four hours of sleep, according to a study from [facility] [employer]. Keep pets out of  the bedroom, and use a white-noise app to drown out snoring. 10. You Will Hit a plateau-And You Can Bust Through It As you slim down, your body releases much less leptin, the fullness hormone.  If you're not strength training, start right now. Building muscle can raise your metabolism to help you overcome a plateau. To keep your body challenged and burning calories, incorporate new moves and more intense intervals into your workouts or add another sweat session to your weekly routine. Alternatively, cut an extra 100 calories or so a day from your diet. Now that you've lost weight, your body simply doesn't need as much fuel.

## 2016-01-18 NOTE — Progress Notes (Signed)
Assessment and Plan:   Hypertension -Continue medication, monitor blood pressure at home. Continue DASH diet.  Reminder to go to the ER if any CP, SOB, nausea, dizziness, severe HA, changes vision/speech, left arm numbness and tingling and jaw pain.  Cholesterol -Continue diet and exercise. Check cholesterol.   Prediabetes  -Continue diet and exercise. Check A1C  Vitamin D Def - check level and continue medications.   Anxiety-  continue medications, continue zoloft and xanax, stress management techniques discussed, increase water, good sleep hygiene discussed, increase exercise, and increase veggies.   Obesity with co morbidities-  long discussion about weight loss, diet, and exercise   Hypogonadism-  continue replacement therapy, check testosterone levels as needed.   Continue diet and meds as discussed. Further disposition pending results of labs. Over 30 minutes of exam, counseling, chart review, and critical decision making was performed Future Appointments Date Time Provider Department Center  05/17/2016 3:00 PM Lucky CowboyWilliam McKeown, MD [facility] None     HPI 40 y.o. male  presents for 3 month follow up on hypertension, cholesterol, prediabetes, and vitamin D deficiency.   His blood pressure has been controlled at home, today their BP is BP: 138/76  He does workout, has been walking on treadmill. He denies chest pain, shortness of breath, dizziness.  He is on cholesterol medication and denies myalgias. His cholesterol is not at goal. The cholesterol last visit was:   Lab Results  Component Value Date   CHOL 181 10/18/2015   HDL 41 10/18/2015   LDLCALC 105 10/18/2015   TRIG 176 (H) 10/18/2015   CHOLHDL 4.4 10/18/2015   Last Z6XA1C in the office was:  Lab Results  Component Value Date   HGBA1C 5.0 10/18/2015   Patient is on Vitamin D supplement.   Lab Results  Component Value Date   VD25OH 58 10/18/2015     BMI is Body mass index is 28 kg/m., he is working on diet and  exercise. Wt Readings from Last 3 Encounters:  01/18/16 230 lb (104.3 kg)  10/18/15 224 lb 6.4 oz (101.8 kg)  07/12/15 226 lb (102.5 kg)   He has a history of testosterone deficiency and is on testosterone replacement, last injection 1 week ago. He states that the testosterone helps with his energy, libido, muscle mass. Lab Results  Component Value Date   TESTOSTERONE 885 (H) 04/06/2015   He has been on zoloft, tried effexor but did not do well with side effects, he is feeling better but still having depression.   Current Medications:    Medication List       Accurate as of 01/18/16  4:45 PM. Always use your most recent med list.          ALPRAZolam 1 MG tablet Commonly known as:  XANAX TAKE 1/2 TO 1 TABLET BY MOUTH 3 TIMES A DAY AS NEEDED   bisoprolol-hydrochlorothiazide 10-6.25 MG tablet Commonly known as:  ZIAC TAKE 1 TABLET DAILY FOR    BLOOD PRESSURE   cholecalciferol 1000 units tablet Commonly known as:  VITAMIN D Take 1,000 Units by mouth daily. Take 5000 IU daily.   enalapril 20 MG tablet Commonly known as:  VASOTEC TAKE 1 TABLET DAILY   EPIDUO 0.1-2.5 % gel Generic drug:  Adapalene-Benzoyl Peroxide   omeprazole 40 MG capsule Commonly known as:  PRILOSEC TAKE 1 CAPSULE DAILY FOR   ACID REFLUX   rosuvastatin 20 MG tablet Commonly known as:  CRESTOR Take 1 tablet (20 mg total) by mouth daily.  testosterone cypionate 200 MG/ML injection Commonly known as:  DEPOTESTOSTERONE CYPIONATE Inject 2 mLs (400 mg total) into the muscle every 14 (fourteen) days.       Medical History:  Past Medical History:  Diagnosis Date  . Anxiety   . Depression   . GERD (gastroesophageal reflux disease)   . Hyperlipidemia   . Hypertension   . Hypogonadism male   . Obesity   . Vitamin D deficiency    Allergies:  Allergies  Allergen Reactions  . Citalopram Other (See Comments)    anxiety  . Pravastatin   . Wellbutrin [Bupropion]      Review of Systems:   Review of Systems  Constitutional: Negative.   HENT: Negative.   Eyes: Negative.   Respiratory: Negative.   Cardiovascular: Negative.   Gastrointestinal: Negative.   Genitourinary: Negative.   Musculoskeletal: Negative.   Skin: Negative.   Neurological: Negative.   Endo/Heme/Allergies: Negative.   Psychiatric/Behavioral: Negative.     Family history- Review and unchanged Social history- Review and unchanged Physical Exam: BP 138/76   Pulse 67   Resp 16   Ht 6\' 4"  (1.93 m)   Wt 230 lb (104.3 kg)   SpO2 97%   BMI 28.00 kg/m  Wt Readings from Last 3 Encounters:  01/18/16 230 lb (104.3 kg)  10/18/15 224 lb 6.4 oz (101.8 kg)  07/12/15 226 lb (102.5 kg)   General Appearance: Well nourished, in no apparent distress. Eyes: PERRLA, EOMs, conjunctiva no swelling or erythema Sinuses: No Frontal/maxillary tenderness ENT/Mouth: Ext aud canals clear, TMs without erythema, bulging. No erythema, swelling, or exudate on post pharynx.  Tonsils not swollen or erythematous. Hearing normal.  Neck: Supple, thyroid normal.  Respiratory: Respiratory effort normal, BS equal bilaterally without rales, rhonchi, wheezing or stridor.  Cardio: RRR with no MRGs. Brisk peripheral pulses without edema.  Abdomen: Soft, + BS,  Non tender, no guarding, rebound, hernias, masses. Lymphatics: Non tender without lymphadenopathy.  Musculoskeletal: Full ROM, 5/5 strength, Normal gait Skin: Warm, dry without rashes, lesions, ecchymosis.  Neuro: Cranial nerves intact. Normal muscle tone, no cerebellar symptoms. Psych: Awake and oriented X 3, normal affect, Insight and Judgment appropriate.    Quentin MullingAmanda Senora Lacson, PA-C 4:45 PM [facility]

## 2016-01-19 LAB — HEPATIC FUNCTION PANEL
ALBUMIN: 4.5 g/dL (ref 3.6–5.1)
ALT: 19 U/L (ref 9–46)
AST: 28 U/L (ref 10–40)
Alkaline Phosphatase: 51 U/L (ref 40–115)
Bilirubin, Direct: 0.2 mg/dL (ref <=0.2)
Indirect Bilirubin: 0.6 mg/dL (ref 0.2–1.2)
TOTAL PROTEIN: 7.5 g/dL (ref 6.1–8.1)
Total Bilirubin: 0.8 mg/dL (ref 0.2–1.2)

## 2016-01-19 LAB — CBC WITH DIFFERENTIAL/PLATELET
BASOS PCT: 0 %
Basophils Absolute: 0 cells/uL (ref 0–200)
EOS ABS: 50 {cells}/uL (ref 15–500)
Eosinophils Relative: 1 %
HEMATOCRIT: 49.2 % (ref 38.5–50.0)
HEMOGLOBIN: 16.8 g/dL (ref 13.2–17.1)
LYMPHS ABS: 1300 {cells}/uL (ref 850–3900)
Lymphocytes Relative: 26 %
MCH: 32.4 pg (ref 27.0–33.0)
MCHC: 34.1 g/dL (ref 32.0–36.0)
MCV: 94.8 fL (ref 80.0–100.0)
MONO ABS: 400 {cells}/uL (ref 200–950)
MPV: 10.6 fL (ref 7.5–12.5)
Monocytes Relative: 8 %
NEUTROS PCT: 65 %
Neutro Abs: 3250 cells/uL (ref 1500–7800)
Platelets: 241 10*3/uL (ref 140–400)
RBC: 5.19 MIL/uL (ref 4.20–5.80)
RDW: 12.8 % (ref 11.0–15.0)
WBC: 5 10*3/uL (ref 3.8–10.8)

## 2016-01-19 LAB — HEMOGLOBIN A1C
HEMOGLOBIN A1C: 5 % (ref <5.7)
MEAN PLASMA GLUCOSE: 97 mg/dL

## 2016-01-19 LAB — BASIC METABOLIC PANEL WITH GFR
BUN: 9 mg/dL (ref 7–25)
CO2: 27 mmol/L (ref 20–31)
Calcium: 9.6 mg/dL (ref 8.6–10.3)
Chloride: 97 mmol/L — ABNORMAL LOW (ref 98–110)
Creat: 0.83 mg/dL (ref 0.60–1.35)
GFR, Est Non African American: >89 mL/min (ref >=60)
GLUCOSE: 83 mg/dL (ref 65–99)
POTASSIUM: 4.4 mmol/L (ref 3.5–5.3)
Sodium: 136 mmol/L (ref 135–146)

## 2016-01-19 LAB — MAGNESIUM: MAGNESIUM: 2.1 mg/dL (ref 1.5–2.5)

## 2016-01-19 LAB — LIPID PANEL
Cholesterol: 185 mg/dL (ref <200)
HDL: 54 mg/dL (ref >40)
LDL CALC: 92 mg/dL (ref <100)
Total CHOL/HDL Ratio: 3.4 Ratio (ref <5.0)
Triglycerides: 193 mg/dL — ABNORMAL HIGH (ref <150)
VLDL: 39 mg/dL — AB (ref <30)

## 2016-01-19 LAB — TSH: TSH: 1.54 mIU/L (ref 0.40–4.50)

## 2016-01-19 LAB — VITAMIN D 25 HYDROXY (VIT D DEFICIENCY, FRACTURES): VIT D 25 HYDROXY: 53 ng/mL (ref 30–100)

## 2016-01-31 ENCOUNTER — Other Ambulatory Visit: Payer: Self-pay | Admitting: Physician Assistant

## 2016-01-31 ENCOUNTER — Encounter: Payer: Self-pay | Admitting: Physician Assistant

## 2016-01-31 MED ORDER — VORTIOXETINE HBR 20 MG PO TABS: 20.0000 mg | ORAL_TABLET | Freq: Every day | ORAL | 1 refills | Status: DC

## 2016-04-06 ENCOUNTER — Other Ambulatory Visit: Payer: Self-pay | Admitting: Internal Medicine

## 2016-04-06 ENCOUNTER — Encounter: Payer: Self-pay | Admitting: Internal Medicine

## 2016-04-06 MED ORDER — TESTOSTERONE CYPIONATE 200 MG/ML IM SOLN: 400.0000 mg | INTRAMUSCULAR | 0 refills | Status: DC

## 2016-05-17 ENCOUNTER — Encounter: Payer: Self-pay | Admitting: Internal Medicine

## 2016-06-05 ENCOUNTER — Encounter: Payer: Self-pay | Admitting: Internal Medicine

## 2016-06-09 ENCOUNTER — Other Ambulatory Visit: Payer: Self-pay | Admitting: Physician Assistant

## 2016-06-15 ENCOUNTER — Other Ambulatory Visit: Payer: Self-pay | Admitting: *Deleted

## 2016-06-15 ENCOUNTER — Other Ambulatory Visit: Payer: Self-pay | Admitting: Internal Medicine

## 2016-06-15 MED ORDER — BISOPROLOL-HYDROCHLOROTHIAZIDE 10-6.25 MG PO TABS: 1.0000 | ORAL_TABLET | Freq: Every day | ORAL | 0 refills | Status: DC

## 2016-06-23 ENCOUNTER — Other Ambulatory Visit: Payer: Self-pay | Admitting: Internal Medicine

## 2016-06-25 NOTE — Progress Notes (Signed)
Complete Physical  Assessment and Plan: Ian Zavala was seen today for annual exam.  Diagnoses and all orders for this visit:  Essential hypertension -     CBC with Differential/Platelet -     BASIC METABOLIC PANEL WITH GFR -     Hepatic function panel -     TSH -     Urinalysis, Routine w reflex microscopic -     Microalbumin / creatinine urine ratio  Testosterone Deficiency -     Testosterone -     Estradiol  Hyperlipidemia -     Lipid panel  Prediabetes -     Hemoglobin A1c  Medication management -     Magnesium  Vitamin D deficiency -     VITAMIN D 25 Hydroxy (Vit-D Deficiency, Fractures)  Gastroesophageal reflux disease, esophagitis presence not specified  Depression with anxiety  Encounter for general adult medical examination with abnormal findings  Anemia, unspecified type -     Iron and TIBC -     Vitamin B12  Screening PSA (prostate specific antigen) -     PSA  Other orders -     metroNIDAZOLE (METROCREAM) 0.75 % cream; Apply topically 2 (two) times daily. -     aluminum chloride (DRYSOL) 20 % external solution; Apply topically at bedtime.    Discussed med's effects and SE's. Screening labs and tests as requested with regular follow-up as recommended. Over 40 minutes of exam, counseling, chart review and critical decision making was performed  HPI Patient presents for a complete physical.   His blood pressure has been controlled at home, today their BP is BP: 126/90 He does workout. He denies chest pain, shortness of breath, dizziness.  He is on cholesterol medication and denies myalgias. His cholesterol is not at goal. The cholesterol last visit was:   Lab Results  Component Value Date   CHOL 185 01/18/2016   HDL 54 01/18/2016   LDLCALC 92 01/18/2016   TRIG 193 (H) 01/18/2016   CHOLHDL 3.4 01/18/2016    Last A1C in the office was:  Lab Results  Component Value Date   HGBA1C 5.0 01/18/2016   Patient is on Vitamin D supplement.   Lab  Results  Component Value Date   VD25OH 92 01/18/2016     Last PSA was: Lab Results  Component Value Date   PSA 0.32 03/18/2014   He has a history of testosterone deficiency and is on testosterone replacement, due tomorrow, does every 2 weeks. He states that the testosterone helps with his energy, libido, muscle mass. Lab Results  Component Value Date   TESTOSTERONE 885 (H) 04/06/2015   BMI is Body mass index is 26.74 kg/m., he is working on diet and exercise. Wt Readings from Last 3 Encounters:  06/26/16 222 lb 9.6 oz (101 kg)  01/18/16 230 lb (104.3 kg)  10/18/15 224 lb 6.4 oz (101.8 kg)    Current Medications:  Current Outpatient Prescriptions on File Prior to Visit  Medication Sig Dispense Refill  . ALPRAZolam (XANAX) 1 MG tablet TAKE 1/2 TO 1 TABLET BY MOUTH 3 TIMES A DAY AS NEEDED 270 tablet 1  . bisoprolol-hydrochlorothiazide (ZIAC) 10-6.25 MG tablet Take 1 tablet by mouth daily. for blood pressure 90 tablet 0  . cholecalciferol (VITAMIN D) 1000 UNITS tablet Take 1,000 Units by mouth daily. Take 5000 IU daily.    . enalapril (VASOTEC) 20 MG tablet TAKE 1 TABLET DAILY 90 tablet 3  . EPIDUO 0.1-2.5 % gel     .  omeprazole (PRILOSEC) 40 MG capsule TAKE 1 CAPSULE DAILY FOR   ACID REFLUX 90 capsule 3  . rosuvastatin (CRESTOR) 20 MG tablet Take 1 tablet (20 mg total) by mouth daily. 90 tablet 1  . testosterone cypionate (DEPOTESTOSTERONE CYPIONATE) 200 MG/ML injection Inject 2 mLs (400 mg total) into the muscle every 14 (fourteen) days. 30 mL 0  . TRINTELLIX 20 MG TABS TAKE 1 TABLET DAILY 90 tablet 1   No current facility-administered medications on file prior to visit.    Allergies:  Allergies  Allergen Reactions  . Citalopram Other (See Comments)    anxiety  . Pravastatin   . Wellbutrin [Bupropion]    Health Maintenance:  Immunization History  Administered Date(s) Administered  . Influenza Whole 12/19/2011  . Influenza, Seasonal, Injecte, Preservative Fre 02/02/2015   . PPD Test 01/09/2013, 04/06/2015  . Td 02/21/2004  . Tdap 04/06/2015   Tetanus: 2017 Pneumovax: N/A Prevnar 13: N/A Flu vaccine: 2016 Zostavax:  DEXA: Colonoscopy: EGD: Eye Exam: Dentist:  Patient Care Team: Lucky CowboyMcKeown, William, MD as PCP - General (Internal Medicine) Lucky CowboyMcKeown, William, MD (Internal Medicine)  Medical History:  has Essential hypertension; Hyperlipidemia; Testosterone Deficiency; Vitamin D deficiency; Depression with anxiety; Obesity (BMI 29.06); Prediabetes; Medication management; and Esophageal reflux on his problem list. Surgical History:  He  has no past surgical history on file. Family History:  His family history includes Cancer (age of onset: 7370) in his father; Depression in his brother and mother; Fibromyalgia in his mother; Hypertension in his brother and mother. Social History:   reports that he has never smoked. He has never used smokeless tobacco. He reports that he drinks about 6.0 oz of alcohol per week . He reports that he does not use drugs. Review of Systems:  Review of Systems  Constitutional: Negative.   HENT: Negative.   Eyes: Negative.   Respiratory: Negative.   Cardiovascular: Negative.   Gastrointestinal: Negative.   Genitourinary: Negative.   Musculoskeletal: Negative.   Skin: Positive for rash (flushing on face with wind/alcohol).    Physical Exam: Estimated body mass index is 26.74 kg/m as calculated from the following:   Height as of this encounter: 6' 4.5" (1.943 m).   Weight as of this encounter: 222 lb 9.6 oz (101 kg). BP 126/90   Pulse 73   Temp 97.3 F (36.3 C)   Resp 16   Ht 6' 4.5" (1.943 m)   Wt 222 lb 9.6 oz (101 kg)   SpO2 98%   BMI 26.74 kg/m  General Appearance: Well nourished, in no apparent distress.  Eyes: PERRLA, EOMs, conjunctiva no swelling or erythema, normal fundi and vessels.  Sinuses: No Frontal/maxillary tenderness  ENT/Mouth: Ext aud canals clear, normal light reflex with TMs without erythema,  bulging. Good dentition. No erythema, swelling, or exudate on post pharynx. Tonsils not swollen or erythematous. Hearing normal.  Neck: Supple, thyroid normal. No bruits  Respiratory: Respiratory effort normal, BS equal bilaterally without rales, rhonchi, wheezing or stridor.  Cardio: RRR without murmurs, rubs or gallops. Brisk peripheral pulses without edema.  Chest: symmetric, with normal excursions and percussion.  Abdomen: Soft, nontender, no guarding, rebound, hernias, masses, or organomegaly.  Lymphatics: Non tender without lymphadenopathy.  Genitourinary:  Musculoskeletal: Full ROM all peripheral extremities,5/5 strength, and normal gait.  Skin: Warm, dry without rashes, lesions, ecchymosis. Neuro: Cranial nerves intact, reflexes equal bilaterally. Normal muscle tone, no cerebellar symptoms. Sensation intact.  Psych: Awake and oriented X 3, normal affect, Insight and Judgment appropriate.  EKG: defer AORTA SCAN: defer  Quentin Mulling 3:47 PM [facility]

## 2016-06-26 ENCOUNTER — Encounter: Payer: Self-pay | Admitting: Physician Assistant

## 2016-06-26 ENCOUNTER — Ambulatory Visit (INDEPENDENT_AMBULATORY_CARE_PROVIDER_SITE_OTHER): Payer: 59 | Admitting: Physician Assistant

## 2016-06-26 VITALS — BP 126/90 | HR 73 | Temp 97.3°F | Resp 16 | Ht 76.5 in | Wt 222.6 lb

## 2016-06-26 DIAGNOSIS — R7303 Prediabetes: Secondary | ICD-10-CM

## 2016-06-26 DIAGNOSIS — Z0000 Encounter for general adult medical examination without abnormal findings: Secondary | ICD-10-CM | POA: Diagnosis not present

## 2016-06-26 DIAGNOSIS — F418 Other specified anxiety disorders: Secondary | ICD-10-CM

## 2016-06-26 DIAGNOSIS — Z125 Encounter for screening for malignant neoplasm of prostate: Secondary | ICD-10-CM

## 2016-06-26 DIAGNOSIS — I10 Essential (primary) hypertension: Secondary | ICD-10-CM

## 2016-06-26 DIAGNOSIS — Z79899 Other long term (current) drug therapy: Secondary | ICD-10-CM

## 2016-06-26 DIAGNOSIS — E782 Mixed hyperlipidemia: Secondary | ICD-10-CM

## 2016-06-26 DIAGNOSIS — E559 Vitamin D deficiency, unspecified: Secondary | ICD-10-CM

## 2016-06-26 DIAGNOSIS — Z0001 Encounter for general adult medical examination with abnormal findings: Secondary | ICD-10-CM

## 2016-06-26 DIAGNOSIS — K219 Gastro-esophageal reflux disease without esophagitis: Secondary | ICD-10-CM

## 2016-06-26 DIAGNOSIS — N529 Male erectile dysfunction, unspecified: Secondary | ICD-10-CM

## 2016-06-26 DIAGNOSIS — D649 Anemia, unspecified: Secondary | ICD-10-CM

## 2016-06-26 LAB — CBC WITH DIFFERENTIAL/PLATELET
BASOS PCT: 0 %
Basophils Absolute: 0 cells/uL (ref 0–200)
Eosinophils Absolute: 47 cells/uL (ref 15–500)
Eosinophils Relative: 1 %
HCT: 47.5 % (ref 38.5–50.0)
HEMOGLOBIN: 16.1 g/dL (ref 13.2–17.1)
LYMPHS ABS: 987 {cells}/uL (ref 850–3900)
Lymphocytes Relative: 21 %
MCH: 32.7 pg (ref 27.0–33.0)
MCHC: 33.9 g/dL (ref 32.0–36.0)
MCV: 96.3 fL (ref 80.0–100.0)
MPV: 10.5 fL (ref 7.5–12.5)
Monocytes Absolute: 282 cells/uL (ref 200–950)
Monocytes Relative: 6 %
NEUTROS ABS: 3384 {cells}/uL (ref 1500–7800)
Neutrophils Relative %: 72 %
Platelets: 201 10*3/uL (ref 140–400)
RBC: 4.93 MIL/uL (ref 4.20–5.80)
RDW: 13.1 % (ref 11.0–15.0)
WBC: 4.7 10*3/uL (ref 3.8–10.8)

## 2016-06-26 LAB — PSA: PSA: 0.4 ng/mL (ref <=4.0)

## 2016-06-26 LAB — TSH: TSH: 1.46 mIU/L (ref 0.40–4.50)

## 2016-06-26 LAB — VITAMIN B12: Vitamin B-12: 638 pg/mL (ref 200–1100)

## 2016-06-26 MED ORDER — ALUMINUM CHLORIDE 20 % EX SOLN: Freq: Every day | CUTANEOUS | 0 refills | Status: DC

## 2016-06-26 MED ORDER — METRONIDAZOLE 0.75 % EX CREA: TOPICAL_CREAM | Freq: Two times a day (BID) | CUTANEOUS | 1 refills | Status: DC

## 2016-06-26 NOTE — Patient Instructions (Signed)

## 2016-06-27 ENCOUNTER — Encounter: Payer: Self-pay | Admitting: Physician Assistant

## 2016-06-27 LAB — BASIC METABOLIC PANEL WITH GFR
BUN: 11 mg/dL (ref 7–25)
CALCIUM: 9.5 mg/dL (ref 8.6–10.3)
CO2: 28 mmol/L (ref 20–31)
Chloride: 98 mmol/L (ref 98–110)
Creat: 1.01 mg/dL (ref 0.60–1.35)
GFR, Est African American: >89 mL/min (ref >=60)
Glucose, Bld: 84 mg/dL (ref 65–99)
POTASSIUM: 4.5 mmol/L (ref 3.5–5.3)
SODIUM: 136 mmol/L (ref 135–146)

## 2016-06-27 LAB — HEPATIC FUNCTION PANEL
ALT: 21 U/L (ref 9–46)
AST: 32 U/L (ref 10–40)
Albumin: 4.5 g/dL (ref 3.6–5.1)
Alkaline Phosphatase: 46 U/L (ref 40–115)
Bilirubin, Direct: 0.2 mg/dL (ref <=0.2)
Indirect Bilirubin: 0.5 mg/dL (ref 0.2–1.2)
Total Bilirubin: 0.7 mg/dL (ref 0.2–1.2)
Total Protein: 7.2 g/dL (ref 6.1–8.1)

## 2016-06-27 LAB — TESTOSTERONE: TESTOSTERONE: 343 ng/dL (ref 250–827)

## 2016-06-27 LAB — IRON AND TIBC
%SAT: 52 % (ref 15–60)
Iron: 169 ug/dL (ref 50–180)
TIBC: 324 ug/dL (ref 250–425)
UIBC: 155 ug/dL (ref 125–400)

## 2016-06-27 LAB — HEMOGLOBIN A1C
HEMOGLOBIN A1C: 4.8 % (ref <5.7)
MEAN PLASMA GLUCOSE: 91 mg/dL

## 2016-06-27 LAB — LIPID PANEL
Cholesterol: 189 mg/dL (ref <200)
HDL: 56 mg/dL (ref >40)
LDL Cholesterol: 87 mg/dL (ref <100)
Total CHOL/HDL Ratio: 3.4 ratio (ref <5.0)
Triglycerides: 228 mg/dL — ABNORMAL HIGH (ref <150)
VLDL: 46 mg/dL — ABNORMAL HIGH (ref <30)

## 2016-06-27 LAB — URINALYSIS, ROUTINE W REFLEX MICROSCOPIC
Bilirubin Urine: NEGATIVE
GLUCOSE, UA: NEGATIVE
HGB URINE DIPSTICK: NEGATIVE
KETONES UR: NEGATIVE
Leukocytes, UA: NEGATIVE
NITRITE: NEGATIVE
PH: 6.5 (ref 5.0–8.0)
Protein, ur: NEGATIVE
SPECIFIC GRAVITY, URINE: 1.006 (ref 1.001–1.035)

## 2016-06-27 LAB — ESTRADIOL: Estradiol: 16 pg/mL (ref <=39)

## 2016-06-27 LAB — MICROALBUMIN / CREATININE URINE RATIO
Creatinine, Urine: 41 mg/dL (ref 20–370)
MICROALB UR: 0.2 mg/dL
Microalb Creat Ratio: 5 mcg/mg creat (ref <30)

## 2016-06-27 LAB — MAGNESIUM: Magnesium: 2.3 mg/dL (ref 1.5–2.5)

## 2016-06-27 LAB — VITAMIN D 25 HYDROXY (VIT D DEFICIENCY, FRACTURES): Vit D, 25-Hydroxy: 40 ng/mL (ref 30–100)

## 2016-06-28 MED ORDER — TESTOSTERONE CYPIONATE 200 MG/ML IM SOLN: 400.0000 mg | INTRAMUSCULAR | 0 refills | Status: DC

## 2016-09-13 ENCOUNTER — Other Ambulatory Visit: Payer: Self-pay | Admitting: Internal Medicine

## 2016-10-04 ENCOUNTER — Ambulatory Visit: Payer: Self-pay | Admitting: Physician Assistant

## 2016-10-05 ENCOUNTER — Other Ambulatory Visit: Payer: Self-pay | Admitting: Physician Assistant

## 2016-11-07 NOTE — Progress Notes (Signed)
Assessment and Plan:   Hypertension -Continue medication, monitor blood pressure at home. Continue DASH diet.  Reminder to go to the ER if any CP, SOB, nausea, dizziness, severe HA, changes vision/speech, left arm numbness and tingling and jaw pain.  Cholesterol -Continue diet and exercise. Check cholesterol.   Prediabetes  -Continue diet and exercise. Check A1C  Vitamin D Def - check level and continue medications.   Anxiety-  continue medications, continue zoloft and xanax, stress management techniques discussed, increase water, good sleep hygiene discussed, increase exercise, and increase veggies.   Obesity with co morbidities-  long discussion about weight loss, diet, and exercise   Hypogonadism-  continue replacement therapy, check testosterone levels as needed.   Continue diet and meds as discussed. Further disposition pending results of labs. Over 30 minutes of exam, counseling, chart review, and critical decision making was performed Future Appointments Date Time Provider Department Center  06/27/2017 3:00 PM Quentin Mulling, PA-C [id number] None     HPI 41 y.o. male  presents for 3 month follow up on hypertension, cholesterol, prediabetes, and vitamin D deficiency.   His blood pressure has been controlled at home, today their BP is BP: 136/84  He does workout, has been walking on treadmill. He denies chest pain, shortness of breath, dizziness.  He is on cholesterol medication and denies myalgias. His cholesterol is not at goal. The cholesterol last visit was:   Lab Results  Component Value Date   CHOL 189 06/26/2016   HDL 56 06/26/2016   LDLCALC 87 06/26/2016   TRIG 228 (H) 06/26/2016   CHOLHDL 3.4 06/26/2016   Last Z6X in the office was:  Lab Results  Component Value Date   HGBA1C 4.8 06/26/2016   Patient is on Vitamin D supplement.   Lab Results  Component Value Date   VD25OH 40 06/26/2016     BMI is Body mass index is 27.66 kg/m., he is working on diet  and exercise. Wt Readings from Last 3 Encounters:  11/08/16 230 lb 3.2 oz (104.4 kg)  06/26/16 222 lb 9.6 oz (101 kg)  01/18/16 230 lb (104.3 kg)   He has a history of testosterone deficiency and is on testosterone replacement, last injection 1 week ago. He states that the testosterone helps with his energy, libido, muscle mass. Lab Results  Component Value Date   TESTOSTERONE 343 06/26/2016   He has been on zoloft, tried effexor but did not do well with side effects, he is feeling better but still having depression, he is on trintellix   Current Medications:  Current Outpatient Prescriptions on File Prior to Visit  Medication Sig  . ALPRAZolam (XANAX) 1 MG tablet TAKE 1/2 TO 1 TABLET BY MOUTH 3 TIMES A DAY AS NEEDED  . aluminum chloride (DRYSOL) 20 % external solution Apply topically at bedtime.  . bisoprolol-hydrochlorothiazide (ZIAC) 10-6.25 MG tablet TAKE 1 TABLET DAILY FOR    BLOOD PRESSURE  . cholecalciferol (VITAMIN D) 1000 UNITS tablet Take 1,000 Units by mouth daily. Take 5000 IU daily.  . enalapril (VASOTEC) 20 MG tablet TAKE 1 TABLET DAILY  . EPIDUO 0.1-2.5 % gel   . metroNIDAZOLE (METROCREAM) 0.75 % cream Apply topically 2 (two) times daily.  Marland Kitchen omeprazole (PRILOSEC) 40 MG capsule TAKE 1 CAPSULE DAILY FOR   ACID REFLUX  . rosuvastatin (CRESTOR) 20 MG tablet Take 1 tablet (20 mg total) by mouth daily.  Marland Kitchen testosterone cypionate (DEPOTESTOSTERONE CYPIONATE) 200 MG/ML injection Inject 2 mLs (400 mg total) into the muscle  every 14 (fourteen) days.  . TRINTELLIX 20 MG TABS TAKE 1 TABLET DAILY   No current facility-administered medications on file prior to visit.     Medical History:  Past Medical History:  Diagnosis Date  . Anxiety   . Depression   . GERD (gastroesophageal reflux disease)   . Hyperlipidemia   . Hypertension   . Hypogonadism male   . Obesity   . Vitamin D deficiency    Allergies:  Allergies  Allergen Reactions  . Citalopram Other (See Comments)     anxiety  . Pravastatin   . Wellbutrin [Bupropion]      Review of Systems:  Review of Systems  Constitutional: Negative.   HENT: Negative.   Eyes: Negative.   Respiratory: Negative.   Cardiovascular: Negative.   Gastrointestinal: Negative.   Genitourinary: Negative.   Musculoskeletal: Negative.   Skin: Negative.   Neurological: Negative.   Endo/Heme/Allergies: Negative.   Psychiatric/Behavioral: Negative.     Family history- Review and unchanged Social history- Review and unchanged Physical Exam: BP 136/84   Pulse 70   Temp 97.7 F (36.5 C)   Resp 18   Ht 6' 4.5" (1.943 m)   Wt 230 lb 3.2 oz (104.4 kg)   SpO2 98%   BMI 27.66 kg/m  Wt Readings from Last 3 Encounters:  11/08/16 230 lb 3.2 oz (104.4 kg)  06/26/16 222 lb 9.6 oz (101 kg)  01/18/16 230 lb (104.3 kg)   General Appearance: Well nourished, in no apparent distress. Eyes: PERRLA, EOMs, conjunctiva no swelling or erythema Sinuses: No Frontal/maxillary tenderness ENT/Mouth: Ext aud canals clear, TMs without erythema, bulging. No erythema, swelling, or exudate on post pharynx.  Tonsils not swollen or erythematous. Hearing normal.  Neck: Supple, thyroid normal.  Respiratory: Respiratory effort normal, BS equal bilaterally without rales, rhonchi, wheezing or stridor.  Cardio: RRR with no MRGs. Brisk peripheral pulses without edema.  Abdomen: Soft, + BS,  Non tender, no guarding, rebound, hernias, masses. Lymphatics: Non tender without lymphadenopathy.  Musculoskeletal: Full ROM, 5/5 strength, Normal gait Skin: Warm, dry without rashes, lesions, ecchymosis.  Neuro: Cranial nerves intact. Normal muscle tone, no cerebellar symptoms. Psych: Awake and oriented X 3, normal affect, Insight and Judgment appropriate.    Quentin Mulling, PA-C 3:41 PM [facility]

## 2016-11-08 ENCOUNTER — Ambulatory Visit (INDEPENDENT_AMBULATORY_CARE_PROVIDER_SITE_OTHER): Payer: 59 | Admitting: Physician Assistant

## 2016-11-08 ENCOUNTER — Encounter: Payer: Self-pay | Admitting: Physician Assistant

## 2016-11-08 VITALS — BP 136/84 | HR 70 | Temp 97.7°F | Resp 18 | Ht 76.5 in | Wt 230.2 lb

## 2016-11-08 DIAGNOSIS — Z23 Encounter for immunization: Secondary | ICD-10-CM

## 2016-11-08 DIAGNOSIS — Z79899 Other long term (current) drug therapy: Secondary | ICD-10-CM | POA: Diagnosis not present

## 2016-11-08 DIAGNOSIS — R7303 Prediabetes: Secondary | ICD-10-CM

## 2016-11-08 DIAGNOSIS — E291 Testicular hypofunction: Secondary | ICD-10-CM

## 2016-11-08 DIAGNOSIS — I10 Essential (primary) hypertension: Secondary | ICD-10-CM | POA: Diagnosis not present

## 2016-11-08 DIAGNOSIS — E782 Mixed hyperlipidemia: Secondary | ICD-10-CM

## 2016-11-08 LAB — CBC WITH DIFFERENTIAL/PLATELET
BASOS PCT: 0.2 %
Basophils Absolute: 13 cells/uL (ref 0–200)
Eosinophils Absolute: 38 cells/uL (ref 15–500)
Eosinophils Relative: 0.6 %
HCT: 44 % (ref 38.5–50.0)
Hemoglobin: 15.4 g/dL (ref 13.2–17.1)
Lymphs Abs: 1292 cells/uL (ref 850–3900)
MCH: 33 pg (ref 27.0–33.0)
MCHC: 35 g/dL (ref 32.0–36.0)
MCV: 94.2 fL (ref 80.0–100.0)
MONOS PCT: 7.5 %
MPV: 10.8 fL (ref 7.5–12.5)
Neutro Abs: 4486 cells/uL (ref 1500–7800)
Neutrophils Relative %: 71.2 %
PLATELETS: 191 10*3/uL (ref 140–400)
RBC: 4.67 10*6/uL (ref 4.20–5.80)
RDW: 12 % (ref 11.0–15.0)
TOTAL LYMPHOCYTE: 20.5 %
WBC mixed population: 473 cells/uL (ref 200–950)
WBC: 6.3 10*3/uL (ref 3.8–10.8)

## 2016-11-08 LAB — LIPID PANEL
CHOLESTEROL: 240 mg/dL — AB (ref <200)
HDL: 57 mg/dL (ref >40)
LDL CHOLESTEROL (CALC): 149 mg/dL — AB
Non-HDL Cholesterol (Calc): 183 mg/dL (calc) — ABNORMAL HIGH (ref <130)
Total CHOL/HDL Ratio: 4.2 (calc) (ref <5.0)
Triglycerides: 202 mg/dL — ABNORMAL HIGH (ref <150)

## 2016-11-08 LAB — HEPATIC FUNCTION PANEL
AG Ratio: 1.9 (calc) (ref 1.0–2.5)
ALBUMIN MSPROF: 4.7 g/dL (ref 3.6–5.1)
ALT: 26 U/L (ref 9–46)
AST: 34 U/L (ref 10–40)
Alkaline phosphatase (APISO): 46 U/L (ref 40–115)
Bilirubin, Direct: 0.3 mg/dL — ABNORMAL HIGH (ref 0.0–0.2)
GLOBULIN: 2.5 g/dL (ref 1.9–3.7)
Indirect Bilirubin: 0.6 mg/dL (calc) (ref 0.2–1.2)
TOTAL PROTEIN: 7.2 g/dL (ref 6.1–8.1)
Total Bilirubin: 0.9 mg/dL (ref 0.2–1.2)

## 2016-11-08 LAB — BASIC METABOLIC PANEL WITH GFR
BUN: 14 mg/dL (ref 7–25)
CALCIUM: 9.4 mg/dL (ref 8.6–10.3)
CHLORIDE: 99 mmol/L (ref 98–110)
CO2: 29 mmol/L (ref 20–32)
Creat: 1.05 mg/dL (ref 0.60–1.35)
GFR, Est African American: 102 mL/min/{1.73_m2} (ref >=60)
GFR, Est Non African American: 88 mL/min/{1.73_m2} (ref >=60)
GLUCOSE: 92 mg/dL (ref 65–99)
Potassium: 4.2 mmol/L (ref 3.5–5.3)
Sodium: 136 mmol/L (ref 135–146)

## 2016-11-08 LAB — TSH: TSH: 1.43 m[IU]/L (ref 0.40–4.50)

## 2016-11-08 LAB — MAGNESIUM: MAGNESIUM: 2.2 mg/dL (ref 1.5–2.5)

## 2016-11-08 MED ORDER — TESTOSTERONE CYPIONATE 200 MG/ML IM SOLN: 400.0000 mg | INTRAMUSCULAR | 1 refills | Status: DC

## 2016-11-08 NOTE — Patient Instructions (Addendum)
Varicose Veins Varicose veins are veins that have become enlarged and twisted. CAUSES This condition is the result of valves in the veins not working properly. Valves in the veins help return blood from the leg to the heart. When your calf muscles squeeze, the blood moves up your leg then the valves close and this continues until the blood gets back to your heart.  If these valves are damaged, blood flows backwards and backs up into the veins in the leg near the skin OR if your are sitting/standing for a long time without using your calf muscles the blood will back up into the veins in your legs. This causes the veins to become larger. People who are on their feet a lot, sit a lot without walking (like on a plane, at a desk, or in a car), who are pregnant, or who are overweight are more likely to develop varicose veins. SYMPTOMS   Bulging, twisted-appearing, bluish veins, most commonly found on the legs.  Leg pain or a feeling of heaviness. These symptoms may be worse at the end of the day.  Leg swelling.  Skin color changes. DIAGNOSIS  Varicose veins can usually be diagnosed with an exam of your legs by your caregiver. He or she may recommend an ultrasound of your leg veins. TREATMENT  Most varicose veins can be treated at home.However, other treatments are available for people who have persistent symptoms or who want to treat the cosmetic appearance of the varicose veins. But this is only cosmetic and they will return if not properly treated. These include:  Laser treatment of very small varicose veins.  Medicine that is shot (injected) into the vein. This medicine hardens the walls of the vein and closes off the vein. This treatment is called sclerotherapy. Afterwards, you may need to wear clothing or bandages that apply pressure.  Surgery. HOME CARE INSTRUCTIONS   Do not stand or sit in one position for long periods of time. Do not sit with your legs crossed. Rest with your legs raised  during the day.  Your legs have to be higher than your heart so that gravity will force the valves to open, so please really elevate your legs.   Wear elastic stockings or support hose. Do not wear other tight, encircling garments around the legs, pelvis, or waist.  [employer]  has a wide variety of well priced compression stockings. [address] [place], [place] [address] [phone]  OR THERE ARE COPPER INFUSED COMPRESSION SOCKS AT [facility] OR [facility]  Walk as much as possible to increase blood flow.  Raise the foot of your bed at night with 2-inch blocks.  If you get a cut in the skin over the vein and the vein bleeds, lie down with your leg raised and press on it with a clean cloth until the bleeding stops. Then place a bandage (dressing) on the cut. See your caregiver if it continues to bleed or needs stitches. SEEK MEDICAL CARE IF:   The skin around your ankle starts to break down.  You have pain, redness, tenderness, or hard swelling developing in your leg over a vein.  You are uncomfortable due to leg pain. Document Released: 11/16/2004 Document Revised: 05/01/2011 Document Reviewed: 04/04/2010 [facility] Patient Information 2014 [place], [place].   Shin Splints Shin splints is a painful condition that is felt on the bone that is located in the front of the lower leg (tibia or shin bone) or in the muscles on either side  of the bone. This condition happens when physical activities lead to inflammation of the muscles, tendons, and the thin layer that covers the shin bone. It may result from participating in sports or other demanding exercise. What are the causes? This condition may be caused by:  Overuse of muscles.  Repetitive activities.  Flat feet or rigid arches.  Activities that could contribute to shin splints include:  Having a sudden increase in exercise time.  Starting a new, demanding activity.  Running up hills or long distances.  Playing sports that  involve sudden starts and stops.  Using a poor warm-up.  Wearing old or worn-out shoes.  What are the signs or symptoms? Symptoms of this condition include:  Pain on the front of the lower leg.  Pain while exercising or at rest.  How is this diagnosed? This condition may be diagnosed based on a physical exam and the history of your symptoms. Your health care provider may observe you as you walk or run. You may also have X-rays or other imaging tests to rule out other problems that could cause lower leg pain, such as a stress fracture. How is this treated? Treatment for this condition may depend on your age, history, health, and how bad the pain is. Most cases of shin splints can be managed by doing one or more of the following:  Resting.  Reducing the length and intensity of your exercise.  Stopping the activity that causes shin pain.  Taking medicines to control the inflammation.  Icing, massaging, stretching, and strengthening the affected area.  Wearing shoes that have rigid heels, shock absorption, and a good arch support.  Follow these instructions at home: Injury care  If directed, apply ice to the injured area: ? Put ice in a plastic bag. ? Place a towel between your skin and the bag. ? Leave the ice on for 20 minutes, 2-3 times a day.  Massage, stretch, and strengthen the affected area as directed by your health care provider. Activity  Rest as needed. Return to activity gradually as directed by your health care provider.  Restart your exercise sessions with non-weight-bearing exercises, such as cycling or swimming.  Stop running if the pain returns.  Warm up properly before exercising.  Run on a surface that is level and fairly firm.  Gradually change the intensity of an exercise.  If you increase your running distance, add only 5-10% to your distance each week. This means that if you are running 5 miles this week, you should only increase your run by -  mile for next week. General instructions  Take medicines only as directed by your health care provider.  Wear shoes that have rigid heels, shock absorption, and a good arch support.  Change your athletic shoes every 6 months, or every 350-450 miles. Contact a health care provider if:  Your symptoms continue or they worsen even after treatment.  The location, intensity, or type of pain changes over time.  You have swelling in your lower leg that gets worse.  Your shin becomes red and feels warm. Get help right away if:  You have severe pain.  You have trouble walking. This information is not intended to replace advice given to you by your health care provider. Make sure you discuss any questions you have with your health care provider. Document Released: 02/04/2000 Document Revised: 10/02/2015 Document Reviewed: 02/02/2014 Elsevier Interactive Patient Education  2018 [employer].   [place] [facility] Ask your health care provider which  exercises are safe for you. Do exercises exactly as told by your health care provider and adjust them as directed. It is normal to feel mild stretching, pulling, tightness, or discomfort as you do these exercises, but you should stop right away if you feel sudden pain or your pain gets worse.Do not begin these exercises until told by your health care provider. Stretching and range of motion exercise This exercise warms up your muscles and joints and improves the movement and flexibility of your lower leg. This exercise also helps to relieve pain, numbness, and tingling. Exercise A: Calf stretch, standing  1. Stand with the ball of your left / right foot on a step. The ball of your foot is on the walking surface, right under your toes. 2. Keep your other foot firmly on the same step. 3. Hold onto the wall, a railing, or a chair for balance. 4. Slowly lift your other foot, allowing your body weight to press your left / right heel down over the edge  of the step. You should feel a stretch in your left / right calf. 5. Hold this position for __________ seconds. 6. Repeat this exercise with a slight bend in your left / right knee. Repeat __________ times with your left / right knee straight and __________ times with your left / right knee bent. Complete this exercise __________ times a day. Strengthening exercises These exercises build strength and endurance in your lower leg. Endurance is the ability to use your muscles for a long time, even after they get tired. Exercise B: Dorsiflexion  1. Secure a rubber exercise band or tubing to a fixed object, such as a table leg or a pole. 2. Secure the other end of the band around your left / right foot. 3. Sit on the floor, facing the fixed object. The band should be slightly tense when your foot is relaxed. 4. Slowly use your ankle muscles to pull your foot toward you. 5. Hold this position for __________ seconds. 6. Slowly release the tension in the band and return your foot to the starting position. Repeat __________ times. Complete this exercise __________ times a day. Exercise C: Ankle eversion with band 1. Secure one end of a rubber exercise band or tubing to a fixed object, such as a table leg or a pole, that will stay in place when the band is pulled. 2. Loop the other end of the band around the middle of your left / right foot. 3. Sit on the floor, facing the fixed object. The band should be slightly tense when your foot is relaxed. 4. Make fists with your hands and put them between your knees. This will focus your strengthening at your ankle. 5. Leading with your little toe, slowly push your banded foot outward, away from your other leg. Make sure the band is positioned to resist the entire motion. 6. Hold this position for __________ seconds. 7. Control the tension in the band as you slowly return your foot to the starting position. Repeat __________ times. Complete this exercise  __________ times a day. Exercise D: Ankle inversion with band 1. Secure one end of a rubber exercise band or tubing to a fixed object, such as a table leg or a pole, that will stay still when the band is pulled. 2. Loop the other end of the band around your left / right foot, just below your toes. 3. Sit on the floor, facing the fixed object. The band should be slightly tense when  your foot is relaxed. 4. Make fists with your hands and put them between your knees. This will focus your strengthening at your ankle. 5. Leading with your big toe, slowly pull your banded foot inward, toward your other leg. Make sure the band is positioned to resist the entire motion. 6. Hold this position for __________ seconds. 7. Control the tension in the band as you slowly return your foot to the starting position. Repeat __________ times. Complete this exercise __________ times a day. Exercise E: Lateral walking with band 1. Stand in a long hallway. 2. Wrap a loop of exercise band around your legs, just above your knees. 3. Bend your knees gently and drop your hips down and back so your weight is over your heels. 4. Step to the side to move down the length of the hallway, keeping your toes pointed forward and keeping tension in the band. 5. Repeat, leading with your other leg. Repeat __________ times. Complete this exercise __________ times a day. Balance exercise This exercise will help improve your control of your foot and ankle when you are standing or walking. Exercise F: Single leg stand 1. Without wearing shoes, stand near a railing or in a doorway. You may hold onto the railing or door frame as needed. 2. Stand on your left / right foot. Keep your big toe down on the floor and try to keep your arch lifted. 3. If this exercise is too easy, you can try doing it with your eyes closed or while standing on a pillow. 4. Hold this position for __________ seconds. Repeat __________ times. Complete this  exercise __________ times a day. This information is not intended to replace advice given to you by your health care provider. Make sure you discuss any questions you have with your health care provider. Document Released: 02/06/2005 Document Revised: 10/12/2015 Document Reviewed: 11/05/2014 Elsevier Interactive Patient Education  [employer].

## 2016-12-12 ENCOUNTER — Other Ambulatory Visit: Payer: Self-pay | Admitting: Internal Medicine

## 2017-01-10 ENCOUNTER — Other Ambulatory Visit: Payer: Self-pay | Admitting: Internal Medicine

## 2017-01-14 ENCOUNTER — Encounter (INDEPENDENT_AMBULATORY_CARE_PROVIDER_SITE_OTHER): Payer: Self-pay

## 2017-01-15 ENCOUNTER — Other Ambulatory Visit: Payer: Self-pay | Admitting: *Deleted

## 2017-01-15 MED ORDER — ROSUVASTATIN CALCIUM 20 MG PO TABS: 20.0000 mg | ORAL_TABLET | Freq: Every day | ORAL | 1 refills | Status: DC

## 2017-02-20 DIAGNOSIS — G4733 Obstructive sleep apnea (adult) (pediatric): Secondary | ICD-10-CM

## 2017-02-20 HISTORY — DX: Obstructive sleep apnea (adult) (pediatric): G47.33

## 2017-03-06 ENCOUNTER — Ambulatory Visit: Payer: Self-pay | Admitting: Physician Assistant

## 2017-03-15 ENCOUNTER — Ambulatory Visit: Payer: Self-pay | Admitting: Physician Assistant

## 2017-03-22 ENCOUNTER — Ambulatory Visit: Payer: Self-pay | Admitting: Physician Assistant

## 2017-05-15 ENCOUNTER — Encounter: Payer: Self-pay | Admitting: Physician Assistant

## 2017-05-15 DIAGNOSIS — E291 Testicular hypofunction: Secondary | ICD-10-CM

## 2017-05-15 MED ORDER — ALPRAZOLAM 1 MG PO TABS: ORAL_TABLET | ORAL | 1 refills | Status: DC

## 2017-05-15 MED ORDER — TESTOSTERONE CYPIONATE 200 MG/ML IM SOLN: 400.0000 mg | INTRAMUSCULAR | 1 refills | Status: DC

## 2017-05-15 MED ORDER — ALPRAZOLAM 1 MG PO TABS: ORAL_TABLET | ORAL | 0 refills | Status: DC

## 2017-05-17 MED ORDER — TESTOSTERONE CYPIONATE 200 MG/ML IM SOLN: 400.0000 mg | INTRAMUSCULAR | 1 refills | Status: DC

## 2017-05-17 MED ORDER — ALPRAZOLAM 1 MG PO TABS: ORAL_TABLET | ORAL | 3 refills | Status: DC

## 2017-05-17 NOTE — Addendum Note (Signed)
Addended by: Quentin MullingOLLIER, Alichia Alridge R on: 05/17/2017 07:46 AM   Modules accepted: Orders

## 2017-05-31 ENCOUNTER — Other Ambulatory Visit: Payer: Self-pay | Admitting: Internal Medicine

## 2017-06-07 ENCOUNTER — Other Ambulatory Visit: Payer: Self-pay | Admitting: Internal Medicine

## 2017-06-14 ENCOUNTER — Encounter: Payer: Self-pay | Admitting: Physician Assistant

## 2017-06-15 ENCOUNTER — Other Ambulatory Visit: Payer: Self-pay | Admitting: Physician Assistant

## 2017-06-15 MED ORDER — ENALAPRIL MALEATE 20 MG PO TABS: 20.0000 mg | ORAL_TABLET | Freq: Every day | ORAL | 0 refills | Status: DC

## 2017-06-25 NOTE — Progress Notes (Signed)
Complete Physical  Assessment and Plan:  Essential hypertension - continue medications, DASH diet, exercise and monitor at home. Call if greater than 130/80.  -     CBC with Differential/Platelet -     BASIC METABOLIC PANEL WITH GFR -     Hepatic function panel -     TSH -     Urinalysis, Routine w reflex microscopic -     Microalbumin / creatinine urine ratio  Testosterone Deficiency -     Testosterone  Hyperlipidemia -continue medications, check lipids, decrease fatty foods, increase activity.  -     Lipid panel  Prediabetes Discussed disease progression and risks Discussed diet/exercise, weight management and risk modification -     Hemoglobin A1c  Medication management -     Magnesium  Vitamin D deficiency -     VITAMIN D 25 Hydroxy (Vit-D Deficiency, Fractures)  Gastroesophageal reflux disease, esophagitis presence not specified Continue PPI/H2 blocker, diet discussed  Depression with anxiety  Encounter for general adult medical examination with abnormal findings  Anemia, unspecified type -     Iron and TIBC -     Vitamin B12  Sleep apnea Will get screening + fatigue, snoring, witness apnea, HTN, chol- will send for screening   Discussed med's effects and SE's. Screening labs and tests as requested with regular follow-up as recommended. Over 40 minutes of exam, counseling, chart review and critical decision making was performed Future Appointments  Date Time Provider Department Center  07/02/2018  3:00 PM Quentin Mulling, PA-C [facility] None    HPI Patient presents for a complete physical.   His blood pressure has been controlled at home, today their BP is BP: 122/70 He does workout. He denies chest pain, shortness of breath, dizziness.   He is on trintellix but states that he feels it is not helping and on xanax PRN, 1 script will last a year. He states zoloft was good in the beginning but he had sexual side effects with it, and he did not do well  with effexor. He snores at night, his wife will wake him up because he will stop breathing, he has fatigue in the AM and throughout the day.   BMI is Body mass index is 28.45 kg/m., he is working on diet and exercise. His weight is up but he has not been working out/eating due to his dad dying in hospice from cancer. His waist is 42.5 inches, goal is less than 40, weight goal 220.  Wt Readings from Last 3 Encounters:  06/27/17 236 lb 12.8 oz (107.4 kg)  11/08/16 230 lb 3.2 oz (104.4 kg)  06/26/16 222 lb 9.6 oz (101 kg)   He is on cholesterol medication, crestor and denies myalgias. His cholesterol is not at goal. The cholesterol last visit was:   Lab Results  Component Value Date   CHOL 240 (H) 11/08/2016   HDL 57 11/08/2016   LDLCALC 149 (H) 11/08/2016   TRIG 202 (H) 11/08/2016   CHOLHDL 4.2 11/08/2016    Last A1C in the office was:  Lab Results  Component Value Date   HGBA1C 4.8 06/26/2016   Patient is on Vitamin D supplement.   Lab Results  Component Value Date   VD25OH 40 06/26/2016     Last PSA was: Lab Results  Component Value Date   PSA 0.4 06/26/2016   He has a history of testosterone deficiency and is on testosterone replacement, due tomorrow, does every 2 weeks. He states that the testosterone  helps with his energy, libido, muscle mass. Lab Results  Component Value Date   TESTOSTERONE 343 06/26/2016     Current Medications:  Current Outpatient Medications on File Prior to Visit  Medication Sig Dispense Refill  . ALPRAZolam (XANAX) 1 MG tablet TAKE 1/2 TO 1 TABLET BY MOUTH 3 TIMES A DAY AS NEEDED TRY TO CUT BACK ON THE AMOUNT, SHOULD LAST LONGER THAN A MONTH 90 tablet 3  . bisoprolol-hydrochlorothiazide (ZIAC) 10-6.25 MG tablet TAKE 1 TABLET DAILY FOR    BLOOD PRESSURE 90 tablet 1  . cholecalciferol (VITAMIN D) 1000 UNITS tablet Take 1,000 Units by mouth daily. Take 5000 IU daily.    . enalapril (VASOTEC) 20 MG tablet Take 1 tablet (20 mg total) by mouth daily.  90 tablet 0  . EPIDUO 0.1-2.5 % gel     . omeprazole (PRILOSEC) 40 MG capsule TAKE 1 CAPSULE DAILY FOR   ACID REFLUX 90 capsule 3  . rosuvastatin (CRESTOR) 20 MG tablet Take 1 tablet (20 mg total) by mouth daily. 90 tablet 1  . testosterone cypionate (DEPOTESTOSTERONE CYPIONATE) 200 MG/ML injection Inject 2 mLs (400 mg total) into the muscle every 14 (fourteen) days. 30 mL 1  . TRINTELLIX 20 MG TABS TAKE 1 TABLET DAILY 90 tablet 1   No current facility-administered medications on file prior to visit.    Allergies:  Allergies  Allergen Reactions  . Citalopram Other (See Comments)    anxiety  . Pravastatin   . Wellbutrin [Bupropion]    Health Maintenance:  Immunization History  Administered Date(s) Administered  . Influenza Inj Mdck Quad With Preservative 11/08/2016  . Influenza Whole 12/19/2011  . Influenza, Seasonal, Injecte, Preservative Fre 02/02/2015  . PPD Test 01/09/2013, 04/06/2015  . Td 02/21/2004  . Tdap 04/06/2015   Tetanus: 2017 Pneumovax: N/A Prevnar 13: N/A Flu vaccine: 2016 Zostavax: N/A HPV: will start today  DEXA: Colonoscopy: N/A EGD: Eye Exam: Dentist:  Patient Care Team: Lucky Cowboy, MD as PCP - General (Internal Medicine) Lucky Cowboy, MD (Internal Medicine)  Medical History:  has Essential hypertension; Hyperlipidemia; Testosterone Deficiency; Vitamin D deficiency; Depression with anxiety; Obesity (BMI 29.06); Prediabetes; Medication management; and Esophageal reflux on their problem list. Surgical History:  He  has no past surgical history on file. Family History:  His family history includes Cancer (age of onset: 39) in his father; Depression in his brother and mother; Fibromyalgia in his mother; Hypertension in his brother and mother. Social History:   reports that he has never smoked. He has never used smokeless tobacco. He reports that he drinks about 6.0 oz of alcohol per week. He reports that he does not use drugs.  Review of  Systems:  Review of Systems  Constitutional: Negative.   HENT: Negative.   Eyes: Negative.   Respiratory: Negative.   Cardiovascular: Negative.   Gastrointestinal: Negative.   Genitourinary: Negative.   Musculoskeletal: Negative.   Skin: Positive for rash (flushing on face with wind/alcohol).    Physical Exam: Estimated body mass index is 28.45 kg/m as calculated from the following:   Height as of this encounter: 6' 4.5" (1.943 m).   Weight as of this encounter: 236 lb 12.8 oz (107.4 kg). BP 122/70   Pulse 82   Temp 98.1 F (36.7 C)   Resp 16   Ht 6' 4.5" (1.943 m)   Wt 236 lb 12.8 oz (107.4 kg)   SpO2 97%   BMI 28.45 kg/m  General Appearance: Well nourished, in no apparent distress.  Eyes: PERRLA, EOMs, conjunctiva no swelling or erythema, normal fundi and vessels.  Sinuses: No Frontal/maxillary tenderness  ENT/Mouth: Ext aud canals clear, normal light reflex with TMs without erythema, bulging. Good dentition. No erythema, swelling, or exudate on post pharynx. Tonsils not swollen or erythematous. Hearing normal.  Neck: Supple, thyroid normal. No bruits  Respiratory: Respiratory effort normal, BS equal bilaterally without rales, rhonchi, wheezing or stridor.  Cardio: RRR without murmurs, rubs or gallops. Brisk peripheral pulses without edema.  Chest: symmetric, with normal excursions and percussion.  Abdomen: Soft, nontender, no guarding, rebound, hernias, masses, or organomegaly.  Lymphatics: Non tender without lymphadenopathy.  Genitourinary: normal, no blood, non tender prostate Musculoskeletal: Full ROM all peripheral extremities,5/5 strength, and normal gait.  Skin: Warm, dry without rashes, lesions, ecchymosis. Neuro: Cranial nerves intact, reflexes equal bilaterally. Normal muscle tone, no cerebellar symptoms. Sensation intact.  Psych: Awake and oriented X 3, normal affect, Insight and Judgment appropriate.   EKG: defer AORTA SCAN: defer  Quentin Mulling 3:24  PM [facility]

## 2017-06-27 ENCOUNTER — Ambulatory Visit: Payer: 59 | Admitting: Physician Assistant

## 2017-06-27 ENCOUNTER — Encounter: Payer: Self-pay | Admitting: Physician Assistant

## 2017-06-27 VITALS — BP 122/70 | HR 82 | Temp 98.1°F | Resp 16 | Ht 76.5 in | Wt 236.8 lb

## 2017-06-27 DIAGNOSIS — R945 Abnormal results of liver function studies: Secondary | ICD-10-CM

## 2017-06-27 DIAGNOSIS — E782 Mixed hyperlipidemia: Secondary | ICD-10-CM

## 2017-06-27 DIAGNOSIS — Z23 Encounter for immunization: Secondary | ICD-10-CM

## 2017-06-27 DIAGNOSIS — E559 Vitamin D deficiency, unspecified: Secondary | ICD-10-CM

## 2017-06-27 DIAGNOSIS — I10 Essential (primary) hypertension: Secondary | ICD-10-CM

## 2017-06-27 DIAGNOSIS — Z0000 Encounter for general adult medical examination without abnormal findings: Secondary | ICD-10-CM

## 2017-06-27 DIAGNOSIS — N529 Male erectile dysfunction, unspecified: Secondary | ICD-10-CM

## 2017-06-27 DIAGNOSIS — F418 Other specified anxiety disorders: Secondary | ICD-10-CM

## 2017-06-27 DIAGNOSIS — Z6828 Body mass index (BMI) 28.0-28.9, adult: Secondary | ICD-10-CM

## 2017-06-27 DIAGNOSIS — Z79899 Other long term (current) drug therapy: Secondary | ICD-10-CM

## 2017-06-27 DIAGNOSIS — K219 Gastro-esophageal reflux disease without esophagitis: Secondary | ICD-10-CM

## 2017-06-27 DIAGNOSIS — G4730 Sleep apnea, unspecified: Secondary | ICD-10-CM

## 2017-06-27 DIAGNOSIS — R7303 Prediabetes: Secondary | ICD-10-CM

## 2017-06-27 DIAGNOSIS — R7989 Other specified abnormal findings of blood chemistry: Secondary | ICD-10-CM

## 2017-06-27 NOTE — Patient Instructions (Addendum)
If you get blood in your stool we will send you to GI We are going to get a sleep study on you  Benefiber or Citracel is good for constipation/diarrhea/irritable bowel syndrome, it helps with weight loss and can help lower your bad cholesterol. Please do 1 TBSP in the morning in water, coffee, or tea. It can take up to a month before you can see a difference with your bowel movements. It is cheapest from [employer], [employer], [employer].   Check out  Mini habits for weight loss book  2 apps for tracking food is myfitness pal  loseit OR can take picture of your food      About Hemorrhoids  Hemorrhoids are swollen veins in the lower rectum and anus.  Also called piles, hemorrhoids are a common problem.  Hemorrhoids may be internal (inside the rectum) or external (around the anus).  Internal Hemorrhoids  Internal hemorrhoids are often painless, but they rarely cause bleeding.  The internal veins may stretch and fall down (prolapse) through the anus to the outside of the body.  The veins may then become irritated and painful.  External Hemorrhoids  External hemorrhoids can be easily seen or felt around the anal opening.  They are under the skin around the anus.  When the swollen veins are scratched or broken by straining, rubbing or wiping they sometimes bleed.  How Hemorrhoids Occur  Veins in the rectum and around the anus tend to swell under pressure.  Hemorrhoids can result from increased pressure in the veins of your anus or rectum.  Some sources of pressure are:   Straining to have a bowel movement because of constipation  Waiting too long to have a bowel movement  Coughing and sneezing often  Sitting for extended periods of time, including on the toilet  Diarrhea  Obesity  Trauma or injury to the anus  Some liver diseases  Stress  Family history of hemorrhoids  Pregnancy  Pregnant women should try to avoid becoming constipated, because they are more likely to have  hemorrhoids during pregnancy.  In the last trimester of pregnancy, the enlarged uterus may press on blood vessels and causes hemorrhoids.  In addition, the strain of childbirth sometimes causes hemorrhoids after the birth.  Symptoms of Hemorrhoids  Some symptoms of hemorrhoids include:  Swelling and/or a tender lump around the anus  Itching, mild burning and bleeding around the anus  Painful bowel movements with or without constipation  Bright red blood covering the stool, on toilet paper or in the toilet bowel.   Symptoms usually go away within a few days.  Always talk to your doctor about any bleeding to make sure it is not from some other causes.  Diagnosing and Treating Hemorrhoids  Diagnosis is made by an examination by your healthcare provider.  Special test can be performed by your doctor.    Most cases of hemorrhoids can be treated with:  High-fiber diet: Eat more high-fiber foods, which help prevent constipation.  Ask for more detailed fiber information on types and sources of fiber from your healthcare provider.  Fluids: Drink plenty of water.  This helps soften bowel movements so they are easier to pass.  Sitz baths and cold packs: Sitting in lukewarm water two or three times a day for 15 minutes cleases the anal area and may relieve discomfort.  If the water is too hot, swelling around the anus will get worse.  Placing a cloth-covered ice pack on the anus for ten minutes four  times a day can also help reduce selling.  Gently pushing a prolapsed hemorrhoid back inside after the bath or ice pack can be helpful.  Medications: For mild discomfort, your healthcare provider may suggest over-the-counter pain medication or prescribe a cream or ointment for topical use.  The cream may contain witch hazel, zinc oxide or petroleum jelly.  Medicated suppositories are also a treatment option.  Always consult your doctor before applying medications or creams.  Procedures and surgeries:  There are also a number of procedures and surgeries to shrink or remove hemorrhoids in more serious cases.  Talk to your physician about these options.  You can often prevent hemorrhoids or keep them from becoming worse by maintaining a healthy lifestyle.  Eat a fiber-rich diet of fruits, vegetables and whole grains.  Also, drink plenty of water and exercise regularly.   2007, [employer] Doc.30

## 2017-06-28 ENCOUNTER — Encounter: Payer: Self-pay | Admitting: Physician Assistant

## 2017-06-28 LAB — COMPLETE METABOLIC PANEL WITH GFR
AG RATIO: 1.6 (calc) (ref 1.0–2.5)
ALBUMIN MSPROF: 4.6 g/dL (ref 3.6–5.1)
ALT: 47 U/L — AB (ref 9–46)
AST: 44 U/L — ABNORMAL HIGH (ref 10–40)
Alkaline phosphatase (APISO): 47 U/L (ref 40–115)
BILIRUBIN TOTAL: 0.8 mg/dL (ref 0.2–1.2)
BUN: 16 mg/dL (ref 7–25)
CALCIUM: 9.6 mg/dL (ref 8.6–10.3)
CO2: 27 mmol/L (ref 20–32)
Chloride: 98 mmol/L (ref 98–110)
Creat: 1.2 mg/dL (ref 0.60–1.35)
GFR, EST AFRICAN AMERICAN: 87 mL/min/{1.73_m2} (ref >=60)
GFR, EST NON AFRICAN AMERICAN: 75 mL/min/{1.73_m2} (ref >=60)
GLUCOSE: 85 mg/dL (ref 65–99)
Globulin: 2.8 g/dL (calc) (ref 1.9–3.7)
Potassium: 4.3 mmol/L (ref 3.5–5.3)
Sodium: 137 mmol/L (ref 135–146)
TOTAL PROTEIN: 7.4 g/dL (ref 6.1–8.1)

## 2017-06-28 LAB — CBC WITH DIFFERENTIAL/PLATELET
BASOS PCT: 0.4 %
Basophils Absolute: 22 cells/uL (ref 0–200)
EOS ABS: 38 {cells}/uL (ref 15–500)
Eosinophils Relative: 0.7 %
HCT: 44.8 % (ref 38.5–50.0)
HEMOGLOBIN: 15.8 g/dL (ref 13.2–17.1)
Lymphs Abs: 1307 cells/uL (ref 850–3900)
MCH: 33.2 pg — AB (ref 27.0–33.0)
MCHC: 35.3 g/dL (ref 32.0–36.0)
MCV: 94.1 fL (ref 80.0–100.0)
MPV: 10.9 fL (ref 7.5–12.5)
Monocytes Relative: 8.4 %
NEUTROS ABS: 3580 {cells}/uL (ref 1500–7800)
Neutrophils Relative %: 66.3 %
PLATELETS: 189 10*3/uL (ref 140–400)
RBC: 4.76 10*6/uL (ref 4.20–5.80)
RDW: 12 % (ref 11.0–15.0)
TOTAL LYMPHOCYTE: 24.2 %
WBC: 5.4 10*3/uL (ref 3.8–10.8)
WBCMIX: 454 {cells}/uL (ref 200–950)

## 2017-06-28 LAB — MICROALBUMIN / CREATININE URINE RATIO
Creatinine, Urine: 31 mg/dL (ref 20–320)
MICROALB/CREAT RATIO: 6 ug/mg{creat} (ref <30)
Microalb, Ur: 0.2 mg/dL

## 2017-06-28 LAB — URINALYSIS, ROUTINE W REFLEX MICROSCOPIC
BILIRUBIN URINE: NEGATIVE
GLUCOSE, UA: NEGATIVE
HGB URINE DIPSTICK: NEGATIVE
KETONES UR: NEGATIVE
Leukocytes, UA: NEGATIVE
Nitrite: NEGATIVE
PH: 6.5 (ref 5.0–8.0)
PROTEIN: NEGATIVE
Specific Gravity, Urine: 1.005 (ref 1.001–1.03)

## 2017-06-28 LAB — LIPID PANEL
CHOL/HDL RATIO: 3.6 (calc) (ref <5.0)
Cholesterol: 197 mg/dL (ref <200)
HDL: 54 mg/dL (ref >40)
LDL CHOLESTEROL (CALC): 117 mg/dL — AB
NON-HDL CHOLESTEROL (CALC): 143 mg/dL — AB (ref <130)
TRIGLYCERIDES: 150 mg/dL — AB (ref <150)

## 2017-06-28 LAB — TESTOSTERONE: Testosterone: 1576 ng/dL — ABNORMAL HIGH (ref 250–827)

## 2017-06-28 LAB — MAGNESIUM: MAGNESIUM: 2.2 mg/dL (ref 1.5–2.5)

## 2017-06-28 LAB — TSH: TSH: 1.64 mIU/L (ref 0.40–4.50)

## 2017-06-28 NOTE — Addendum Note (Signed)
Addended by: Quentin Mulling R on: 06/28/2017 07:07 AM   Modules accepted: Orders

## 2017-07-03 ENCOUNTER — Other Ambulatory Visit: Payer: Self-pay | Admitting: Internal Medicine

## 2017-08-06 ENCOUNTER — Other Ambulatory Visit: Payer: Self-pay | Admitting: Internal Medicine

## 2017-08-28 ENCOUNTER — Ambulatory Visit: Payer: Self-pay

## 2017-09-04 ENCOUNTER — Ambulatory Visit: Payer: 59 | Admitting: Neurology

## 2017-09-04 ENCOUNTER — Encounter: Payer: Self-pay | Admitting: Neurology

## 2017-09-04 VITALS — BP 130/88 | HR 72 | Ht 76.0 in | Wt 236.0 lb

## 2017-09-04 DIAGNOSIS — F32A Depression, unspecified: Secondary | ICD-10-CM

## 2017-09-04 DIAGNOSIS — R351 Nocturia: Secondary | ICD-10-CM

## 2017-09-04 DIAGNOSIS — R5383 Other fatigue: Secondary | ICD-10-CM

## 2017-09-04 DIAGNOSIS — G4710 Hypersomnia, unspecified: Secondary | ICD-10-CM | POA: Diagnosis not present

## 2017-09-04 DIAGNOSIS — F4321 Adjustment disorder with depressed mood: Secondary | ICD-10-CM

## 2017-09-04 DIAGNOSIS — G4730 Sleep apnea, unspecified: Secondary | ICD-10-CM | POA: Diagnosis not present

## 2017-09-04 DIAGNOSIS — F329 Major depressive disorder, single episode, unspecified: Secondary | ICD-10-CM | POA: Diagnosis not present

## 2017-09-04 NOTE — Patient Instructions (Signed)

## 2017-09-04 NOTE — Progress Notes (Signed)
SLEEP MEDICINE CLINIC   Provider:  Melvyn Novas, M.D.   Primary Care Physician:  Lucky Cowboy, MD   Referring Provider: Lucky Cowboy, MD    Chief Complaint  Patient presents with  . New Patient (Initial Visit)    pt alone, rm 11. pt states that he has not had a sleep study completed. pt's wife states he stops breathing. wakes up during the night covered in sweat. pt states that he snores. never feels well rested.    HPI:  Ian Zavala is a 42 y.o. male , seen here as in a referral from Dr. Oneta Rack for a sleep evaluation.   Chief complaint according to patient : " I have sleep apnea, based on my wife's observation of breathing and snoring, pausing in my breathing , waking diaphoretic .  I don't feel rested or restored.The patient was diagnosed with HTN at age 63, while slender. He was identified during an [employer] admission physical.   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   Sleep habits are as follows: 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 after work and once he returns home by about 6:30 PM, currently he uses his treadmill at home for 30 to 45 minutes before dinner, he will watch TV at night, his bedtime will be around 10 to 10:30 PM. Most usually he is asleep promptly, he shares the marital bedroom with his wife, she has noticed him to snore.  His children have also mentioned that he snores. He dreams a lot, vividly. He recalls that during shift work times he barely got sleep in daytime. Took Xanax ever since - 0.5 mg at night . Patient likes his bedroom cool, quiet and dark.  He uses light blocking curtains, a sound machine for the background and a box fan.He works up 4-5 times each night, often wakes to use the bathroom.  The patient often wakes up spontaneously between 5:45 and 6 AM, when he has to rise to go to work. He feels  neither refreshed no restored in the morning.  He has not been for long time. When off work he will someday nap for about one hour. He feels more refreshed after these naps.    Sleep medical history and family sleep history: father passed at age 90 due to lung and kidney cancer. Parents were divorced for 12 years.  Mother alive with HTN, morbidly obese. Arthritis, mobility. Back pain, depression, fibromyalgia. Father remarried.    Social history:married , shift worker, 2 children.  he drinks one gallon of water a day-  He liked to drink 4-5 beers after work , since 05-Mar-2019after his father died. He stopped last month and now snored much less. Smoked cigars socially- none in 5 years- caffeine : drinks 2 cups of coffee / espresso and several during a work day. Last at 4 Pm. Seldomly sodas, drinks iced tea unsweetened when eating out for dinner, 3 times a week. .   Review of Systems: Out of a complete 14 system review, the patient complains of only the following symptoms, and all other reviewed systems are negative.  Snoring. Weigh gain , nocturia, fatigue, sleepiness. Witnessed apnea.   Epworth score 7 , Fatigue severity score 43  , depression score N/a    Social History   Socioeconomic History  .  Marital status: Married    Spouse name: Not on file  . Number of children: Not on file  . Years of education: Not on file  . Highest education level: Not on file  Occupational History  . Not on file  Social Needs  . Financial resource strain: Not on file  . Food insecurity:    Worry: Not on file    Inability: Not on file  . Transportation needs:    Medical: Not on file    Non-medical: Not on file  Tobacco Use  . Smoking status: Never Smoker  . Smokeless tobacco: Never Used  Substance and Sexual Activity  . Alcohol use: Yes    Types: 12 drink(s) per week  . Drug use: No  . Sexual activity: Yes  Lifestyle  . Physical activity:    Days per week: Not on file    Minutes per session:  Not on file  . Stress: Not on file  Relationships  . Social connections:    Talks on phone: Not on file    Gets together: Not on file    Attends religious service: Not on file    Active member of club or organization: Not on file    Attends meetings of clubs or organizations: Not on file    Relationship status: Not on file  . Intimate partner violence:    Fear of current or ex partner: Not on file    Emotionally abused: Not on file    Physically abused: Not on file    Forced sexual activity: Not on file  Other Topics Concern  . Not on file  Social History Narrative  . Not on file    Family History  Problem Relation Age of Onset  . Hypertension Mother   . Depression Mother   . Fibromyalgia Mother   . Depression Brother   . Hypertension Brother   . Cancer Father [phone]       urethral/lung cancer  . Depression Sister     Past Medical History:  Diagnosis Date  . Anxiety   . Depression   . GERD (gastroesophageal reflux disease)   . Hyperlipidemia   . Hypertension   . Hypogonadism male   . Obesity   . Vitamin D deficiency     No past surgical history on file.  Current Outpatient Medications  Medication Sig Dispense Refill  . ALPRAZolam (XANAX) 1 MG tablet TAKE 1/2 TO 1 TABLET BY MOUTH 3 TIMES A DAY AS NEEDED TRY TO CUT BACK ON THE AMOUNT, SHOULD LAST LONGER THAN A MONTH 90 tablet 3  . bisoprolol-hydrochlorothiazide (ZIAC) 10-6.25 MG tablet TAKE 1 TABLET DAILY FOR    BLOOD PRESSURE 90 tablet 1  . cholecalciferol (VITAMIN D) 1000 UNITS tablet Take 1,000 Units by mouth daily. Take 5000 IU daily.    . enalapril (VASOTEC) 20 MG tablet Take 1 tablet (20 mg total) by mouth daily. 90 tablet 0  . EPIDUO 0.1-2.5 % gel     . omeprazole (PRILOSEC) 40 MG capsule TAKE 1 CAPSULE DAILY FOR   ACID REFLUX 90 capsule 3  . rosuvastatin (CRESTOR) 20 MG tablet TAKE 1 TABLET DAILY 90 tablet 1  . testosterone cypionate (DEPOTESTOSTERONE CYPIONATE) 200 MG/ML injection Inject 2 mLs (400 mg total)  into the muscle every 14 (fourteen) days. 30 mL 1  . TRINTELLIX 20 MG TABS tablet TAKE 1 TABLET DAILY 90 tablet 0   No current facility-administered medications for this visit.     Allergies as of  09/04/2017 - Review Complete 09/04/2017  Allergen Reaction Noted  . Citalopram Other (See Comments) 01/08/2013  . Pravastatin  01/08/2013  . Wellbutrin [bupropion]  01/08/2013    Vitals: There were no vitals taken for this visit. Last Weight:  Wt Readings from Last 1 Encounters:  06/27/17 236 lb 12.8 oz (107.4 kg)   BJY:NWGNF is no height or weight on file to calculate BMI.      Last Height:   Ht Readings from Last 1 Encounters:  06/27/17 6' 4.5" (1.943 m)    Physical exam:  General: The patient is awake, alert and appears not in acute distress. The patient is well groomed. Head: Normocephalic, atraumatic. Neck is supple. Mallampati 4,   neck circumference:18". Nasal airflow congested , TMJ is not evident . Retrognathia is seen. The patient has facial hair.  Cardiovascular:  Regular rate and rhythm , without  murmurs or carotid bruit, and without distended neck veins. Respiratory: Lungs are clear to auscultation. Skin:  Without evidence of edema, or rash Trunk: BMI is 29. The patient's posture is erect.   Neurologic exam : The patient is awake and alert, oriented to place and time.   Memory subjective described as intact.  Attention span & concentration ability appears normal.  Speech is fluent,  without dysarthria, dysphonia or aphasia.  Mood and affect are appropriate.  Cranial nerves: Pupils are equal and briskly reactive to light.  Funduscopic exam without evidence of pallor or edema.  Extraocular movements  in vertical and horizontal planes intact and without nystagmus. Visual fields by finger perimetry are intact. Hearing to finger rub intact.  Facial sensation intact to fine touch. Facial motor strength is symmetric and tongue and uvula move midline. Shoulder shrug was  symmetrical.   Motor exam:   Normal tone, muscle bulk and symmetric strength in all extremities. Sensory:  Fine touch, pinprick and vibration were tested in all extremities. Proprioception tested in the upper extremities was normal. Coordination: Rapid alternating movements in the fingers/hands was normal. Finger-to-nose maneuver  normal without evidence of ataxia, dysmetria or tremor. Gait and station: Patient walks without assistive device .Tandem gait is unfragmented. Turns with 3 Steps. Romberg testing is negative. Deep tendon reflexes: in the  upper and lower extremities are symmetric and intact. Babinski maneuver response is downgoing.  I reviewed recent labs with Quentin Mulling.   Assessment:  After physical and neurologic examination, review of laboratory studies,  Personal review of imaging studies, reports of other /same  Imaging studies, results of polysomnography and / or neurophysiology testing and pre-existing records as far as provided in visit., my assessment is :  1) Mr. Pharo has been witnessed to snore and has had witnessed pauses in his nocturnal breathing.  He also describes other signs and symptoms that are frequently correlated with sleep apnea such as nocturia, frequent nocturnal arousals, fragmented sleep, low quality sleep that is no longer refreshing and restorative.  His snoring has been loudest when in supine and he has learned to sleep on his side, he sleeps with 2 pillows one for head support one between the knees.  2) the patient's BMI is under 30, but weight has been waxing and waning and with the recent loss of his father he had also for a limited time higher intake of calories and less exercise.  For the last month he has made a lot of changes, has significantly less beer intake and is already noticing that he is dropping weight again.  3) hypertension since age  18, originally diagnosed during  the [employer] medical exam.  His mother suffers from hypertension  since teenage and this may well be a medical condition throughout the maternal line.  4) Hypercholesterolemia and hypertriglyceridemia while on Crestor   The patient was advised of the nature of the diagnosed disorder , the treatment options and the  risks for general health and wellness arising from not treating the condition.   I spent more than of face to face time with the patient.  Greater than 50% of time was spent in counseling and coordination of care. We have discussed the diagnosis and differential and I answered the patient's questions.    Plan:  Treatment plan and additional workup :  Fatigue can be related to sleep apnea, will obtain a SPLIT night , but Monia Pouch will only permit HST.  Will follow after sleep test for treatment recommendations.    Melvyn Novas, MD 09/04/2017, 1:01 PM  Certified in Neurology by ABPN Certified in Sleep Medicine by [facility] [address], [address] [place], [place] [address]

## 2017-09-05 ENCOUNTER — Other Ambulatory Visit: Payer: Self-pay | Admitting: Physician Assistant

## 2017-09-10 ENCOUNTER — Other Ambulatory Visit: Payer: 59

## 2017-09-10 DIAGNOSIS — R945 Abnormal results of liver function studies: Principal | ICD-10-CM

## 2017-09-10 DIAGNOSIS — R7989 Other specified abnormal findings of blood chemistry: Secondary | ICD-10-CM

## 2017-09-10 LAB — HEPATIC FUNCTION PANEL
AG RATIO: 2.4 (calc) (ref 1.0–2.5)
ALBUMIN MSPROF: 4.8 g/dL (ref 3.6–5.1)
ALT: 47 U/L — AB (ref 9–46)
AST: 44 U/L — ABNORMAL HIGH (ref 10–40)
Alkaline phosphatase (APISO): 39 U/L — ABNORMAL LOW (ref 40–115)
BILIRUBIN TOTAL: 0.7 mg/dL (ref 0.2–1.2)
Bilirubin, Direct: 0.2 mg/dL (ref 0.0–0.2)
Globulin: 2 g/dL (calc) (ref 1.9–3.7)
Indirect Bilirubin: 0.5 mg/dL (calc) (ref 0.2–1.2)
Total Protein: 6.8 g/dL (ref 6.1–8.1)

## 2017-09-13 ENCOUNTER — Other Ambulatory Visit: Payer: Self-pay | Admitting: Internal Medicine

## 2017-09-13 DIAGNOSIS — R945 Abnormal results of liver function studies: Secondary | ICD-10-CM

## 2017-09-13 DIAGNOSIS — R7989 Other specified abnormal findings of blood chemistry: Secondary | ICD-10-CM

## 2017-09-19 ENCOUNTER — Other Ambulatory Visit: Payer: Self-pay | Admitting: Internal Medicine

## 2017-09-19 ENCOUNTER — Ambulatory Visit (INDEPENDENT_AMBULATORY_CARE_PROVIDER_SITE_OTHER): Payer: 59 | Admitting: Neurology

## 2017-09-19 DIAGNOSIS — R61 Generalized hyperhidrosis: Secondary | ICD-10-CM

## 2017-09-19 DIAGNOSIS — R5383 Other fatigue: Secondary | ICD-10-CM

## 2017-09-19 DIAGNOSIS — F329 Major depressive disorder, single episode, unspecified: Secondary | ICD-10-CM

## 2017-09-19 DIAGNOSIS — G4710 Hypersomnia, unspecified: Secondary | ICD-10-CM | POA: Diagnosis not present

## 2017-09-19 DIAGNOSIS — G4730 Sleep apnea, unspecified: Secondary | ICD-10-CM

## 2017-09-19 DIAGNOSIS — R351 Nocturia: Secondary | ICD-10-CM

## 2017-09-19 DIAGNOSIS — F4321 Adjustment disorder with depressed mood: Secondary | ICD-10-CM

## 2017-09-19 DIAGNOSIS — F518 Other sleep disorders not due to a substance or known physiological condition: Secondary | ICD-10-CM

## 2017-09-19 DIAGNOSIS — R002 Palpitations: Secondary | ICD-10-CM

## 2017-09-25 NOTE — Addendum Note (Signed)
Addended by: Melvyn NovasHMEIER, Eleen Litz on: 09/25/2017 06:41 PM   Modules accepted: Orders

## 2017-09-25 NOTE — Procedures (Signed)
NAME:    Ian Zavala                                                  DOB: [birth year] MEDICAL RECORD NO: [id number]                                          DOS:  09/19/2017 REFERRING PHYSICIAN: Lucky CowboyWilliam McKeown, MD STUDY PERFORMED: Home Sleep Study on watch pat  HISTORY: Ian Zavala is a 42 year old Caucasian male patient presenting with this chief complaint: " I have sleep apnea, based on my wife's observation of breathing and snoring, pausing in my breathing, and I am waking up in sweat . I don't feel rested nor restored." He reports vivid dreams, falls asleep easily, and finds naps in daytime refreshing. The patient was diagnosed with HTN at age 42, while slender at the time and physically active and identified during an [employer] admission physical exam. 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  Epworth Sleepiness score endorsed at 7/24 points, Fatigue severity score at 43/63 points. The BMI is 28.7.  STUDY RESULTS:  Total Recording Time: 8 hours 23 minutes, valid test time 7 h and 34 min.  Total Apnea/Hypopnea Index (AHI):  25.5 /h; RDI:  26.2 /h Average Oxygen Saturation: 93 %; Lowest Oxygen Saturation: 73 %  Total Time in Oxygen Saturation Below 89 %: 32.0 minutes.  Average Heart Rate:  68 bpm (between 44 and 133 bpm). 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 Total AHI was 25.5/h, REM AHI estimated at 36.4/h.  2) Prolonged total hypoxemia time of 32 minutes related to supine sleep position.   3) There was potentially a REM proportion of 41.1% for the HST sleep time- very unusual.  4) Tachy- Bradycardia noted.  RECOMMENDATION: Moderate- Severe Sleep Apnea. This patient is to be evaluated in an attended sleep study for CPAP / BiPAP titration.  REM latency data will help to determine the need to evaluate further for narcolepsy.  Important is the verification of sleep architecture and heart rate / rhythm analysis.        I certify that I have reviewed the  raw data recording prior to the issuance of this report in accordance with the standards of the American Academy of Sleep Medicine (AASM). Melvyn Novasarmen Arvis Zwahlen, M.D.     09-25-2017       Medical Director of [facility], accredited by the AASM. Diplomat of the ABPN and ABSM.

## 2017-09-27 ENCOUNTER — Telehealth: Payer: Self-pay | Admitting: Neurology

## 2017-09-27 NOTE — Telephone Encounter (Signed)
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 Total  AHI was 25.5/h, REM AHI estimated at 36.4/h.  2) Prolonged total hypoxemia time of 32 minutes related to supine  sleep position.  3) There was potentially a REM proportion of 41.1% for the HST sleep time- very unusual.  4) Tachy- Bradycardia noted.  RECOMMENDATION: Moderate- Severe Sleep Apnea. This patient is to  be evaluated in an attended sleep study for CPAP / BiPAP  titration. REM latency data will help to determine the need to evaluate  further for narcolepsy.  Important is the verification of sleep architecture and heart  rate / rhythm analysis.

## 2017-09-27 NOTE — Telephone Encounter (Signed)
I called pt. I advised pt that Dr. Dohmeier reviewed their sleep study results and found that moderate to severe sleep apnea and recommends that pt be treated with a cpap. Dr. Dohmeier recommends that pt return for a repeat sleep study in order to properly titrate the cpap and ensure a good mask fit. Pt is agreeable to returning for a titration study. I advised pt that our sleep lab will file with pt's insurance and call pt to schedule the sleep study when we hear back from the pt's insurance regarding coverage of this sleep study. Pt verbalized understanding of results. Pt had no questions at this time but was encouraged to call back if questions arise.   

## 2017-10-01 ENCOUNTER — Telehealth: Payer: Self-pay

## 2017-10-01 DIAGNOSIS — G4730 Sleep apnea, unspecified: Principal | ICD-10-CM

## 2017-10-01 DIAGNOSIS — G4710 Hypersomnia, unspecified: Secondary | ICD-10-CM

## 2017-10-01 NOTE — Telephone Encounter (Signed)
Called the pt and informed him of insurance denying the sleep study. Informed him that we will start him on a auto CPAP per Dr Dohmeier's recommendation. Order placed and will send to [facility].  I reviewed PAP compliance expectations with the pt. Pt is agreeable to starting a CPAP. I advised pt that an order will be sent to a DME, [facility], and [facility] will call the pt within about one week after they file with the pt's insurance. [facility] will show the pt how to use the machine, fit for masks, and troubleshoot the CPAP if needed. A follow up appt was made for insurance purposes with Ian PennyMegan Millikan, NP on Nov 19,2019 at 2:30 pm. Pt verbalized understanding to arrive 15 minutes early and bring their CPAP. A letter with all of this information in it will be mailed to the pt as a reminder. I verified with the pt that the address we have on file is correct. Pt verbalized understanding of results. Pt had no questions at this time but was encouraged to call back if questions arise.

## 2017-10-01 NOTE — Telephone Encounter (Signed)
Insurance has denied in lab CPAP titration study request.  Due to centrals only being 10%, insurance is recommending auto PAP.  If we need to bring back after PAP set-up, we could possibly at that time.

## 2017-10-01 NOTE — Addendum Note (Signed)
Addended by: Melvyn NovasHMEIER, Sylvan Sookdeo on: 10/01/2017 01:22 PM   Modules accepted: Orders

## 2017-10-08 ENCOUNTER — Other Ambulatory Visit: Payer: Self-pay | Admitting: Internal Medicine

## 2017-10-08 ENCOUNTER — Other Ambulatory Visit: Payer: 59

## 2017-10-08 DIAGNOSIS — R7989 Other specified abnormal findings of blood chemistry: Secondary | ICD-10-CM

## 2017-10-08 DIAGNOSIS — R945 Abnormal results of liver function studies: Principal | ICD-10-CM

## 2017-10-09 LAB — HEPATIC FUNCTION PANEL
AG RATIO: 2.2 (calc) (ref 1.0–2.5)
ALKALINE PHOSPHATASE (APISO): 42 U/L (ref 40–115)
ALT: 45 U/L (ref 9–46)
AST: 46 U/L — ABNORMAL HIGH (ref 10–40)
Albumin: 4.9 g/dL (ref 3.6–5.1)
Bilirubin, Direct: 0.3 mg/dL — ABNORMAL HIGH (ref 0.0–0.2)
GLOBULIN: 2.2 g/dL (ref 1.9–3.7)
Indirect Bilirubin: 0.8 mg/dL (calc) (ref 0.2–1.2)
TOTAL PROTEIN: 7.1 g/dL (ref 6.1–8.1)
Total Bilirubin: 1.1 mg/dL (ref 0.2–1.2)

## 2017-10-26 ENCOUNTER — Other Ambulatory Visit: Payer: Self-pay | Admitting: Internal Medicine

## 2017-10-26 DIAGNOSIS — E291 Testicular hypofunction: Secondary | ICD-10-CM

## 2017-10-26 MED ORDER — TESTOSTERONE CYPIONATE 200 MG/ML IM SOLN: 400.0000 mg | INTRAMUSCULAR | 1 refills | Status: DC

## 2017-11-14 ENCOUNTER — Ambulatory Visit: Payer: Self-pay | Admitting: Physician Assistant

## 2017-11-26 ENCOUNTER — Other Ambulatory Visit: Payer: Self-pay | Admitting: Internal Medicine

## 2017-11-29 ENCOUNTER — Encounter: Payer: Self-pay | Admitting: Physician Assistant

## 2017-11-29 ENCOUNTER — Ambulatory Visit: Payer: 59 | Admitting: Physician Assistant

## 2017-11-29 VITALS — BP 122/82 | HR 67 | Temp 98.2°F | Ht 76.0 in | Wt 235.4 lb

## 2017-11-29 DIAGNOSIS — I10 Essential (primary) hypertension: Secondary | ICD-10-CM

## 2017-11-29 DIAGNOSIS — Z79899 Other long term (current) drug therapy: Secondary | ICD-10-CM | POA: Diagnosis not present

## 2017-11-29 DIAGNOSIS — R7303 Prediabetes: Secondary | ICD-10-CM | POA: Diagnosis not present

## 2017-11-29 DIAGNOSIS — E782 Mixed hyperlipidemia: Secondary | ICD-10-CM | POA: Diagnosis not present

## 2017-11-29 DIAGNOSIS — G4730 Sleep apnea, unspecified: Secondary | ICD-10-CM | POA: Insufficient documentation

## 2017-11-29 NOTE — Progress Notes (Signed)
Assessment and Plan:   Hypertension -Continue medication, monitor blood pressure at home. Continue DASH diet.  Reminder to go to the ER if any CP, SOB, nausea, dizziness, severe HA, changes vision/speech, left arm numbness and tingling and jaw pain.  Cholesterol -Continue diet and exercise. Check cholesterol.   Prediabetes  -Continue diet and exercise. Check A1C  Vitamin D Def - check level and continue medications.   Anxiety-  continue medications, continue zoloft and xanax, stress management techniques discussed, increase water, good sleep hygiene discussed, increase exercise, and increase veggies.   Obesity with co morbidities-  long discussion about weight loss, diet, and exercise   Hypogonadism-  continue replacement therapy, check testosterone levels as needed.   Continue diet and meds as discussed. Further disposition pending results of labs. Over 30 minutes of exam, counseling, chart review, and critical decision making was performed Future Appointments  Date Time Provider Department Center  01/01/2018  3:00 PM [facility] NURSE [facility] None  01/08/2018  2:30 PM Butch Penny, NP [facility] None  07/02/2018  3:00 PM Quentin Mulling, PA-C [facility] None     HPI 42 y.o. male  presents for 3 month follow up on hypertension, cholesterol, prediabetes, and vitamin D deficiency.   His blood pressure has been controlled at home, today their BP is BP: 122/82  He does workout, has been walking on treadmill. He denies chest pain, shortness of breath, dizziness. He has sleep apnea, has been wearing CPAP x 1 month and feels better.   He is not on cholesterol medication, WAS STOPPED DUE TO LIVER ENZYMES. Marland Kitchen His cholesterol is not at goal. The cholesterol last visit was:   Lab Results  Component Value Date   CHOL 197 06/27/2017   HDL 54 06/27/2017   LDLCALC 117 (H) 06/27/2017   TRIG 150 (H) 06/27/2017   CHOLHDL 3.6 06/27/2017   Last O7F in the office was:  Lab Results   Component Value Date   HGBA1C 4.8 06/26/2016   Patient is on Vitamin D supplement.   Lab Results  Component Value Date   VD25OH 40 06/26/2016     BMI is Body mass index is 28.65 kg/m., he is working on diet and exercise. Wt Readings from Last 3 Encounters:  11/29/17 235 lb 6.4 oz (106.8 kg)  09/04/17 236 lb (107 kg)  06/27/17 236 lb 12.8 oz (107.4 kg)   He has a history of testosterone deficiency and is on testosterone replacement, last injection 1 week ago. He states that the testosterone helps with his energy, libido, muscle mass. Lab Results  Component Value Date   TESTOSTERONE 1,576 (H) 06/27/2017   He has been on zoloft, tried effexor but did not do well with side effects, he is feeling better but still having depression, he is on trintellix   Current Medications:  Current Outpatient Medications on File Prior to Visit  Medication Sig  . ALPRAZolam (XANAX) 1 MG tablet TAKE 1/2 TO 1 TABLET BY MOUTH 3 TIMES A DAY AS NEEDED TRY TO CUT BACK ON THE AMOUNT, SHOULD LAST LONGER THAN A MONTH  . bisoprolol-hydrochlorothiazide (ZIAC) 10-6.25 MG tablet TAKE 1 TABLET DAILY FOR    BLOOD PRESSURE  . cholecalciferol (VITAMIN D) 1000 UNITS tablet Take 1,000 Units by mouth daily. Take 5000 IU daily.  . enalapril (VASOTEC) 20 MG tablet TAKE 1 TABLET DAILY  . EPIDUO 0.1-2.5 % gel   . omeprazole (PRILOSEC) 40 MG capsule TAKE 1 CAPSULE DAILY FOR   ACID REFLUX  . rosuvastatin (CRESTOR)  20 MG tablet TAKE 1 TABLET DAILY  . testosterone cypionate (DEPOTESTOSTERONE CYPIONATE) 200 MG/ML injection Inject 2 mLs (400 mg total) into the muscle every 14 (fourteen) days.  . TRINTELLIX 20 MG TABS tablet TAKE 1 TABLET DAILY   No current facility-administered medications on file prior to visit.     Medical History:  Past Medical History:  Diagnosis Date  . Anxiety   . Depression   . GERD (gastroesophageal reflux disease)   . Hyperlipidemia   . Hypertension   . Hypogonadism male   . Obesity   .  Vitamin D deficiency    Allergies:  Allergies  Allergen Reactions  . Citalopram Other (See Comments)    anxiety  . Pravastatin   . Wellbutrin [Bupropion]      Review of Systems:  Review of Systems  Constitutional: Negative.   HENT: Negative.   Eyes: Negative.   Respiratory: Negative.   Cardiovascular: Negative.   Gastrointestinal: Negative.   Genitourinary: Negative.   Musculoskeletal: Negative.   Skin: Negative.   Neurological: Negative.   Endo/Heme/Allergies: Negative.   Psychiatric/Behavioral: Negative.     Family history- Review and unchanged Social history- Review and unchanged Physical Exam: BP 122/82   Pulse 67   Temp 98.2 F (36.8 C)   Ht 6\' 4"  (1.93 m)   Wt 235 lb 6.4 oz (106.8 kg)   SpO2 98%   BMI 28.65 kg/m  Wt Readings from Last 3 Encounters:  11/29/17 235 lb 6.4 oz (106.8 kg)  09/04/17 236 lb (107 kg)  06/27/17 236 lb 12.8 oz (107.4 kg)   General Appearance: Well nourished, in no apparent distress. Eyes: PERRLA, EOMs, conjunctiva no swelling or erythema Sinuses: No Frontal/maxillary tenderness ENT/Mouth: Ext aud canals clear, TMs without erythema, bulging. No erythema, swelling, or exudate on post pharynx.  Tonsils not swollen or erythematous. Hearing normal.  Neck: Supple, thyroid normal.  Respiratory: Respiratory effort normal, BS equal bilaterally without rales, rhonchi, wheezing or stridor.  Cardio: RRR with no MRGs. Brisk peripheral pulses without edema.  Abdomen: Soft, + BS,  Non tender, no guarding, rebound, hernias, masses. Lymphatics: Non tender without lymphadenopathy.  Musculoskeletal: Full ROM, 5/5 strength, Normal gait Skin: Warm, dry without rashes, lesions, ecchymosis.  Neuro: Cranial nerves intact. Normal muscle tone, no cerebellar symptoms. Psych: Awake and oriented X 3, normal affect, Insight and Judgment appropriate.    Quentin Mulling, PA-C 3:48 PM [facility]

## 2017-11-30 LAB — CBC WITH DIFFERENTIAL/PLATELET
BASOS ABS: 27 {cells}/uL (ref 0–200)
BASOS PCT: 0.4 %
EOS PCT: 0.7 %
Eosinophils Absolute: 47 cells/uL (ref 15–500)
HCT: 44.3 % (ref 38.5–50.0)
Hemoglobin: 15.4 g/dL (ref 13.2–17.1)
LYMPHS ABS: 1554 {cells}/uL (ref 850–3900)
MCH: 33.1 pg — ABNORMAL HIGH (ref 27.0–33.0)
MCHC: 34.8 g/dL (ref 32.0–36.0)
MCV: 95.3 fL (ref 80.0–100.0)
MONOS PCT: 6.8 %
MPV: 11.9 fL (ref 7.5–12.5)
NEUTROS ABS: 4616 {cells}/uL (ref 1500–7800)
Neutrophils Relative %: 68.9 %
Platelets: 204 10*3/uL (ref 140–400)
RBC: 4.65 10*6/uL (ref 4.20–5.80)
RDW: 11.8 % (ref 11.0–15.0)
Total Lymphocyte: 23.2 %
WBC mixed population: 456 cells/uL (ref 200–950)
WBC: 6.7 10*3/uL (ref 3.8–10.8)

## 2017-11-30 LAB — COMPLETE METABOLIC PANEL WITH GFR
AG Ratio: 2.1 (calc) (ref 1.0–2.5)
ALBUMIN MSPROF: 4.6 g/dL (ref 3.6–5.1)
ALT: 28 U/L (ref 9–46)
AST: 29 U/L (ref 10–40)
Alkaline phosphatase (APISO): 41 U/L (ref 40–115)
BUN: 13 mg/dL (ref 7–25)
CALCIUM: 9.6 mg/dL (ref 8.6–10.3)
CO2: 29 mmol/L (ref 20–32)
CREATININE: 1.2 mg/dL (ref 0.60–1.35)
Chloride: 101 mmol/L (ref 98–110)
GFR, EST NON AFRICAN AMERICAN: 75 mL/min/{1.73_m2} (ref >=60)
GFR, Est African American: 87 mL/min/{1.73_m2} (ref >=60)
GLUCOSE: 76 mg/dL (ref 65–99)
Globulin: 2.2 g/dL (calc) (ref 1.9–3.7)
Potassium: 4 mmol/L (ref 3.5–5.3)
SODIUM: 138 mmol/L (ref 135–146)
Total Bilirubin: 0.8 mg/dL (ref 0.2–1.2)
Total Protein: 6.8 g/dL (ref 6.1–8.1)

## 2017-11-30 LAB — TSH: TSH: 1.52 mIU/L (ref 0.40–4.50)

## 2017-11-30 LAB — LIPID PANEL
CHOL/HDL RATIO: 7 (calc) — AB (ref <5.0)
CHOLESTEROL: 223 mg/dL — AB (ref <200)
HDL: 32 mg/dL — ABNORMAL LOW (ref >40)
LDL CHOLESTEROL (CALC): 165 mg/dL — AB
Non-HDL Cholesterol (Calc): 191 mg/dL (calc) — ABNORMAL HIGH (ref <130)
TRIGLYCERIDES: 127 mg/dL (ref <150)

## 2017-11-30 LAB — HEMOGLOBIN A1C
Hgb A1c MFr Bld: 4.9 % of total Hgb (ref <5.7)
MEAN PLASMA GLUCOSE: 94 (calc)
eAG (mmol/L): 5.2 (calc)

## 2017-11-30 LAB — MAGNESIUM: Magnesium: 2.2 mg/dL (ref 1.5–2.5)

## 2017-12-16 ENCOUNTER — Other Ambulatory Visit: Payer: Self-pay | Admitting: Internal Medicine

## 2018-01-01 ENCOUNTER — Ambulatory Visit: Payer: Self-pay

## 2018-01-06 ENCOUNTER — Encounter: Payer: Self-pay | Admitting: Neurology

## 2018-01-08 ENCOUNTER — Encounter: Payer: Self-pay | Admitting: Adult Health

## 2018-01-08 ENCOUNTER — Ambulatory Visit: Payer: 59 | Admitting: Adult Health

## 2018-01-08 VITALS — BP 135/82 | HR 67 | Ht 76.0 in | Wt 240.8 lb

## 2018-01-08 DIAGNOSIS — Z9989 Dependence on other enabling machines and devices: Secondary | ICD-10-CM | POA: Diagnosis not present

## 2018-01-08 DIAGNOSIS — G4733 Obstructive sleep apnea (adult) (pediatric): Secondary | ICD-10-CM

## 2018-01-08 NOTE — Patient Instructions (Signed)

## 2018-01-08 NOTE — Progress Notes (Signed)
PATIENT: Ian Zavala DOB: 11-30-1975  REASON FOR VISIT: follow up HISTORY FROM: patient  HISTORY OF PRESENT ILLNESS: Today 01/08/18:  Mr. Hoeffner is a 42 year old male with a history of obstructive sleep apnea on CPAP.  He returns today for follow-up.  His CPAP download indicates machine 30 out of 30 days for compliance of 100%.  He uses machine greater than 4 hours each night.  On average he uses his machine 8 hours and 15 minutes.  His residual AHI is 3.3 on 5-15 cmH2O with EPR 3.  His leak in the 95th percentile is 25.5 L/min.  He states that sometimes during the night he will take his mask off but not from the machine off to have a drink of water.  He is unsure if this is affecting his leak time.  Overall he notices the benefit with using the CPAP.  He states that he does not feel the mask leaking.  His Epworth sleepiness score is 8 he returns today for evaluation.  HISTORY (Copied from Dr.Dohmeier's note) 09/04/17: Ian Zavala is a 41 y.o. male , seen here as in a referral from Dr. Oneta Rack for a sleep evaluation.   Chief complaint according to patient : " I have sleep apnea, based on my wife's observation of breathing and snoring, pausing in my breathing , waking diaphoretic .  I don't feel rested or restored.The patient was diagnosed with HTN at age 24, while slender. He was identified during an [employer] admission physical.   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   Sleep habits are as follows: Mr. 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 after work and once he returns home by about 6:30 PM, currently he uses his treadmill at home for 30 to 45 minutes before dinner, he will watch TV at night, his bedtime will be around 10 to 10:30 PM. Most usually he is asleep promptly, he shares the marital bedroom with his wife, she has noticed him to  snore.  His children have also mentioned that he snores. He dreams a lot, vividly. He recalls that during shift work times he barely got sleep in daytime. Took Xanax ever since - 0.5 mg at night . Patient likes his bedroom cool, quiet and dark.  He uses light blocking curtains, a sound machine for the background and a box fan.He works up 4-5 times each night, often wakes to use the bathroom.  The patient often wakes up spontaneously between 5:45 and 6 AM, when he has to rise to go to work. He feels neither refreshed no restored in the morning.  He has not been for long time. When off work he will someday nap for about one hour. He feels more refreshed after these naps.    Sleep medical history and family sleep history: father passed at age 18 due to lung and kidney cancer. Parents were divorced for 12 years.  Mother alive with HTN, morbidly obese. Arthritis, mobility. Back pain, depression, fibromyalgia. Father remarried.    REVIEW OF SYSTEMS: Out of a complete 14 system review of symptoms, the patient complains only of the following symptoms, and all other reviewed systems are negative.  Apnea  ALLERGIES: Allergies  Allergen Reactions  . Citalopram Other (See Comments)    anxiety  . Pravastatin   . Wellbutrin [  Bupropion]     HOME MEDICATIONS: Outpatient Medications Prior to Visit  Medication Sig Dispense Refill  . ALPRAZolam (XANAX) 1 MG tablet TAKE 1/2 TO 1 TABLET BY MOUTH 3 TIMES A DAY AS NEEDED TRY TO CUT BACK ON THE AMOUNT, SHOULD LAST LONGER THAN A MONTH 90 tablet 3  . bisoprolol-hydrochlorothiazide (ZIAC) 10-6.25 MG tablet TAKE 1 TABLET DAILY FOR    BLOOD PRESSURE 90 tablet 1  . cholecalciferol (VITAMIN D) 1000 UNITS tablet Take 1,000 Units by mouth daily. Take 5000 IU daily.    . enalapril (VASOTEC) 20 MG tablet TAKE 1 TABLET DAILY 90 tablet 0  . EPIDUO 0.1-2.5 % gel     . omeprazole (PRILOSEC) 40 MG capsule TAKE 1 CAPSULE DAILY FOR   ACID REFLUX 90 capsule 3  . rosuvastatin  (CRESTOR) 20 MG tablet TAKE 1 TABLET DAILY 90 tablet 1  . testosterone cypionate (DEPOTESTOSTERONE CYPIONATE) 200 MG/ML injection Inject 2 mLs (400 mg total) into the muscle every 14 (fourteen) days. 14 mL 1  . TRINTELLIX 20 MG TABS tablet TAKE 1 TABLET DAILY 90 tablet 0   No facility-administered medications prior to visit.     PAST MEDICAL HISTORY: Past Medical History:  Diagnosis Date  . Anxiety   . Depression   . GERD (gastroesophageal reflux disease)   . Hyperlipidemia   . Hypertension   . Hypogonadism male   . Obesity   . OSA on CPAP 2019  . Vitamin D deficiency     PAST SURGICAL HISTORY: No past surgical history on file.  FAMILY HISTORY: Family History  Problem Relation Age of Onset  . Hypertension Mother   . Depression Mother   . Fibromyalgia Mother   . Depression Brother   . Hypertension Brother   . Cancer Father 2070       urethral/lung cancer  . Depression Sister     SOCIAL HISTORY: Social History   Socioeconomic History  . Marital status: Married    Spouse name: Not on file  . Number of children: Not on file  . Years of education: Not on file  . Highest education level: Not on file  Occupational History  . Not on file  Social Needs  . Financial resource strain: Not on file  . Food insecurity:    Worry: Not on file    Inability: Not on file  . Transportation needs:    Medical: Not on file    Non-medical: Not on file  Tobacco Use  . Smoking status: Never Smoker  . Smokeless tobacco: Never Used  Substance and Sexual Activity  . Alcohol use: Yes    Types: 12 drink(s) per week  . Drug use: No  . Sexual activity: Yes  Lifestyle  . Physical activity:    Days per week: Not on file    Minutes per session: Not on file  . Stress: Not on file  Relationships  . Social connections:    Talks on phone: Not on file    Gets together: Not on file    Attends religious service: Not on file    Active member of club or organization: Not on file    Attends  meetings of clubs or organizations: Not on file    Relationship status: Not on file  . Intimate partner violence:    Fear of current or ex partner: Not on file    Emotionally abused: Not on file    Physically abused: Not on file    Forced sexual activity:  Not on file  Other Topics Concern  . Not on file  Social History Narrative  . Not on file      PHYSICAL EXAM  Vitals:   01/08/18 1416  BP: 135/82  Pulse: 67  Weight: 240 lb 12.8 oz (109.2 kg)  Height: 6\' 4"  (1.93 m)   Body mass index is 29.31 kg/m.  Generalized: Well developed, in no acute distress   Neurological examination  Mentation: Alert oriented to time, place, history taking. Follows all commands speech and language fluent Cranial nerve II-XII: Pupils were equal round reactive to light. Extraocular movements were full, visual field were full on confrontational test. Facial sensation and strength were normal. Uvula tongue midline. Head turning and shoulder shrug  were normal and symmetric.  Neck circumference 18 inches, Mallampati 4+ Motor: The motor testing reveals 5 over 5 strength of all 4 extremities. Good symmetric motor tone is noted throughout.  Sensory: Sensory testing is intact to soft touch on all 4 extremities. No evidence of extinction is noted.  Gait and station: Gait is normal.    DIAGNOSTIC DATA (LABS, IMAGING, TESTING) - I reviewed patient records, labs, notes, testing and imaging myself where available.  Lab Results  Component Value Date   WBC 6.7 11/29/2017   HGB 15.4 11/29/2017   HCT 44.3 11/29/2017   MCV 95.3 11/29/2017   PLT 204 11/29/2017      Component Value Date/Time   NA 138 11/29/2017 1626   K 4.0 11/29/2017 1626   CL 101 11/29/2017 1626   CO2 29 11/29/2017 1626   GLUCOSE 76 11/29/2017 1626   BUN 13 11/29/2017 1626   CREATININE 1.20 11/29/2017 1626   CALCIUM 9.6 11/29/2017 1626   PROT 6.8 11/29/2017 1626   ALBUMIN 4.5 06/26/2016 1627   AST 29 11/29/2017 1626   ALT 28  11/29/2017 1626   ALKPHOS 46 06/26/2016 1627   BILITOT 0.8 11/29/2017 1626   GFRNONAA 75 11/29/2017 1626   GFRAA 87 11/29/2017 1626   Lab Results  Component Value Date   CHOL 223 (H) 11/29/2017   HDL 32 (L) 11/29/2017   LDLCALC 165 (H) 11/29/2017   TRIG 127 11/29/2017   CHOLHDL 7.0 (H) 11/29/2017   Lab Results  Component Value Date   HGBA1C 4.9 11/29/2017   Lab Results  Component Value Date   VITAMINB12 638 06/26/2016   Lab Results  Component Value Date   TSH 1.52 11/29/2017      ASSESSMENT AND PLAN 42 y.o. year old male  has a past medical history of Anxiety, Depression, GERD (gastroesophageal reflux disease), Hyperlipidemia, Hypertension, Hypogonadism male, Obesity, OSA on CPAP (2019), and Vitamin D deficiency. here with:  1.  Obstructive sleep apnea on CPAP  The patient CPAP download shows excellent compliance and good treatment of his apnea.  He is encouraged to continue using the CPAP nightly and greater than 4 hours each night.  He is advised that if his symptoms worsen or he develops new symptoms he should let us know.  He will follow-up in 6 months or sooner if needed.   I spent 15 minutes with the patient. 50% of this time was spent reviewing CPAP download   Butch Penny, MSN, NP-C 01/08/2018, 2:30 PM [facility] [address], [address] [place], [place] [address] [phone]

## 2018-02-14 MED ORDER — ALPRAZOLAM 1 MG PO TABS: ORAL_TABLET | ORAL | 3 refills | Status: DC

## 2018-03-16 ENCOUNTER — Other Ambulatory Visit: Payer: Self-pay | Admitting: Internal Medicine

## 2018-04-02 ENCOUNTER — Ambulatory Visit: Payer: Self-pay | Admitting: Physician Assistant

## 2018-04-02 ENCOUNTER — Ambulatory Visit: Payer: Self-pay | Admitting: Adult Health

## 2018-04-12 DIAGNOSIS — E291 Testicular hypofunction: Secondary | ICD-10-CM

## 2018-04-14 MED ORDER — TESTOSTERONE CYPIONATE 200 MG/ML IM SOLN: 400.0000 mg | INTRAMUSCULAR | 1 refills | Status: DC

## 2018-04-16 ENCOUNTER — Encounter: Payer: Self-pay | Admitting: Adult Health

## 2018-04-16 ENCOUNTER — Ambulatory Visit: Payer: 59 | Admitting: Adult Health

## 2018-04-16 VITALS — BP 124/84 | HR 84 | Temp 97.3°F | Ht 76.0 in | Wt 235.0 lb

## 2018-04-16 DIAGNOSIS — K219 Gastro-esophageal reflux disease without esophagitis: Secondary | ICD-10-CM

## 2018-04-16 DIAGNOSIS — I10 Essential (primary) hypertension: Secondary | ICD-10-CM

## 2018-04-16 DIAGNOSIS — G4730 Sleep apnea, unspecified: Secondary | ICD-10-CM

## 2018-04-16 DIAGNOSIS — E559 Vitamin D deficiency, unspecified: Secondary | ICD-10-CM

## 2018-04-16 DIAGNOSIS — E663 Overweight: Secondary | ICD-10-CM

## 2018-04-16 DIAGNOSIS — E782 Mixed hyperlipidemia: Secondary | ICD-10-CM

## 2018-04-16 DIAGNOSIS — R7309 Other abnormal glucose: Secondary | ICD-10-CM

## 2018-04-16 DIAGNOSIS — N529 Male erectile dysfunction, unspecified: Secondary | ICD-10-CM | POA: Diagnosis not present

## 2018-04-16 DIAGNOSIS — Z79899 Other long term (current) drug therapy: Secondary | ICD-10-CM

## 2018-04-16 DIAGNOSIS — F418 Other specified anxiety disorders: Secondary | ICD-10-CM

## 2018-04-16 DIAGNOSIS — F4321 Adjustment disorder with depressed mood: Secondary | ICD-10-CM

## 2018-04-16 NOTE — Patient Instructions (Addendum)
Goals    . Blood Pressure < 130/80    . Exercise 150 min/wk Moderate Activity    . LDL CALC < 100       Ideally, xanax should be used < 5 days - recommend cutting down very gradually, cut down from 1/2 to 1/4 tabs slowly, then gradually stop using on the weekends or just when you really feel you need it, etc  Try to only buy single portion of splurge items   Or... consider an app for tracking/accountability    Google mindful eating and here are some tips and tricks below.   Rate your hunger before you eat on a scale of 1-10, try to eat closer to a 6 or higher. And if you are at below that, why are you eating? Slow down and listen to your body.          Counseling services  Here are some numbers below you can try but I suggest calling your insurance and finding out who is in your network and THEN calling those people or looking them up on google.   I'm a big fan of Cognitive Behavioral Therapy, look this up on You tube or check with the therapist you see if they are certified.  This form of therapy helps to teach you skills to better handle with current situation that are causing anxiety or depression.   [facility] [place], NP [phone] [address]  [phone] Fax [phone]  [facility] Hours: Monday-Thursday 830-8pm  Friday 830AM-7PM Address: [address] [phone]  [facility].  Address: [address] [place], [place] [address] [phone]  [facility] [phone] office [email or web address] [address], [address] [place], [place], [place] [address]  Dr. Vilinda Flake, Ph.D. [address] [place] [address] Phone: [phone]  Catharine Dowda, [place] [phone] [address] [place] [address]   Franchot Erichsen, MA, clinical psychologist  Cognitive-Behavior Therapy; Mood Disorders; Anxiety  Disorders; adult and child ADHD; Family Therapy; Stress Management; personal growth, and Marital Therapy.    Carlus Pavlov Ph.D., clinical psychologist Cognitive-Behavior Therapy; Mood Disorders; Anxiety Disorders; Stress     Management  [facility] [address], [place], [place] [address] [phone]   [facility] Sheran Luz, psychotherapist [address] [place], [place] [address] [phone]  Miguel Aschoff Ph.D., clinical psychologist [phone] office [address] [place], [place] [address] Cognitive Behavior Therapy, Depression, Bipolar, Anxiety, Grief and Loss       Cognitive Behavioral Therapy Cognitive behavioral therapy (CBT) is a short-term, goal-oriented type of talk therapy. CBT can help you:  Identify patterns of thinking, feeling, and behaving that are causing you problems.  Decide how you want to think, feel, and respond to life events.  Set goals to change the beliefs and thoughts that cause you to act in ways that are not helpful for you.  Follow up on the changes that you make. What are the different types of CBT? The different types of CBT include:  Dialectical behavioral therapy (DBT). This approach is often used in group therapy, and it aids a person in managing behavior by focusing on: ? Things that cause problems to start (triggers). ? Methods of self-calming. ? Re-evaluating thinking processes.  Mindfulness-based cognitive therapy. This approach involves focusing your attention, meditating, and developing awareness of the present moment (mindfulness).  Rational emotive behavior therapy. This approach uses rational  thought to reframe your thinking so it is less judgmental. Your therapist may directly challenge your thought processes.  Stress inoculation training. This approach involves planning ahead for stressful situations by practicing new thoughts and behaviors. This planning can help you avoid going back to old  actions.  Acceptance and commitment therapy (ACT). This approach focuses on accepting yourself as you are and practicing mindfulness. It helps you understand what you would like to change and how you can set goals in that direction. What conditions is CBT used to treat? CBT may help to treat:  Mental health conditions, including: ? Depression. ? Anxiety. ? Bipolar disorder. ? Eating disorders. ? Post-traumatic stress disorder (PTSD). ? Obsessive-compulsive disorder (OCD).  Insomnia and other sleep disorders.  Pain.  Stress.  Coping with loss or grief.  Coping with a difficult medical diagnosis or illness.  Relationship problems.  Emotional distress or shock (trauma). How can CBT help me? CBT may:  Give you a chance to share your thoughts, feelings, problems, and fears in a safe space.  Help you focus on specific problems.  Give you homework that helps you put theory into practice. Homework may include keeping a journal or doing thinking exercises.  Help you become aware of your patterns of thinking, feeling, and behaving, and how those three patterns affect each other.  Change your thoughts so that you can change your behaviors.  Help you chose how you want to view the world.  Teach you planned coping skills and offer better ways to deal with stress and difficult situations. To make the most of CBT, make sure you:  Find a licensed therapist whom you trust.  Take an active part in your therapy and do the homework that you are given.  Are honest about your problems.  Avoid skipping your therapy sessions. Summary  Cognitive behavioral therapy (CBT) is a short-term, goal-oriented type of talk therapy.  CBT can help you become aware of your patterns and the relationships among your thoughts, feelings, and behavior.  CBT may help mental health conditions and other problems. This information is not intended to replace advice given to you by your health care  provider. Make sure you discuss any questions you have with your health care provider. Document Released: 06/20/2016 Document Revised: 06/20/2016 Document Reviewed: 06/20/2016 Elsevier Interactive Patient Education  2019 [employer].

## 2018-04-16 NOTE — Progress Notes (Signed)
Assessment and Plan:    Hypertension Continue medication, monitor blood pressure at home. Continue DASH diet.  Reminder to go to the ER if any CP, SOB, nausea, dizziness, severe HA, changes vision/speech, left arm numbness and tingling and jaw pain.  Cholesterol Continue medications: newly back on rosuvastatin 20 mg every other day  Continue low cholesterol diet and exercise.  Check lipid panel.   Other abnormal glucose Recent A1Cs at goal Discussed diet/exercise, weight management  Defer A1C; check CMP  Vitamin D Def Continue supplementation Check vitamin D level  Anxiety Currently off of trintellix after self-tapering, my impression is not true medication intolerance, he is considering restarting after discussion today, may do well staying on med during winter months and tapering off during summer Disucssed CBT; he is interested, information provided Discussed xanax use, ideally to limit to <5 days per week, he will try tapering stress management techniques discussed, increase water, good sleep hygiene discussed, increase exercise, and increase veggies.   Overweight Long discussion about weight loss, diet, and exercise Recommended diet heavy in fruits and veggies and low in animal meats, cheeses, and dairy products, appropriate calorie intake Patient will work on portion control of splurge items, limit snacks in home, tracking via app Discussed appropriate weight for height  Follow up at next visit   Hypogonadism  continue replacement therapy, check testosterone levels as needed.    GERD Well managed on current medications; discussed risks with PPI; he declines to attempt taper at this time; monitor vit D, calcium, magnesium closely  Discussed diet, avoiding triggers and other lifestyle changes  Continue diet and meds as discussed. Further disposition pending results of labs. Over 30 minutes of exam, counseling, chart review, and critical decision making was  performed Future Appointments  Date Time Provider Department Center  04/16/2018  4:15 PM Judd Gaudier, NP [facility] None  07/02/2018  3:00 PM Quentin Mulling, PA-C [facility] None  08/27/2018  2:00 PM Butch Penny, NP [facility] None     HPI 43 y.o. male  presents for 3 month follow up on hypertension, cholesterol, prediabetes, and vitamin D deficiency.   He has been on zoloft, celexa, wellburin, effexor but did not do well with side effects, he is feeling better but still having some depression and anxiety (has fam hx and personal history 10+ years), he was on trintellix for 12-18 months then self-tapered off due to perceived "agitation" which he denies noting early in course of therapy. He is also taking xanax in the evening, takes 1/2 tab in the morning as needed. Overall he feels he is doing better than previous, but after extended discussion today is considering restarting trintellix. He has not tried CBT.   BMI is Body mass index is 28.61 kg/m., he has been working on diet and exercise, runs 2-3 days a week. Rarely eats fast food, but reports he struggles with portion control.  He reports he typically has a greek yogurt for breakfast, typically eats out for lunch, tries to choose healthy (mithos, chicken kebab with salad), eats dinner at home and may go overboard.   Wt Readings from Last 3 Encounters:  04/16/18 235 lb (106.6 kg)  01/08/18 240 lb 12.8 oz (109.2 kg)  11/29/17 235 lb 6.4 oz (106.8 kg)    His blood pressure has been controlled at home, today their BP is BP: 124/84  He does workout, has been walking on treadmill. He denies chest pain, shortness of breath, dizziness.  He has sleep apnea, has been wearing CPAP  since 10/2017 and feels much better    He is on cholesterol medication, WAS STOPPED DUE TO LIVER ENZYMES, has restarted and currently taking rosuvastatin 20 mg twice weekly. His cholesterol is not at goal. The cholesterol last visit was:   Lab Results  Component  Value Date   CHOL 223 (H) 11/29/2017   HDL 32 (L) 11/29/2017   LDLCALC 165 (H) 11/29/2017   TRIG 127 11/29/2017   CHOLHDL 7.0 (H) 11/29/2017   Last A1C in the office was:  Lab Results  Component Value Date   HGBA1C 4.9 11/29/2017   Patient is on Vitamin D supplement.   Lab Results  Component Value Date   VD25OH 40 06/26/2016   He has a history of testosterone deficiency and is on testosterone replacement, last injection 5 days ago. He states that the testosterone helps with his energy, libido, muscle mass. Lab Results  Component Value Date   TESTOSTERONE 1,576 (H) 06/27/2017     Current Medications:  Current Outpatient Medications on File Prior to Visit  Medication Sig  . ALPRAZolam (XANAX) 1 MG tablet TAKE 1/2 TO 1 TABLET BY MOUTH 3 TIMES A DAY AS NEEDED TRY TO CUT BACK ON THE AMOUNT, SHOULD LAST LONGER THAN A MONTH  . bisoprolol-hydrochlorothiazide (ZIAC) 10-6.25 MG tablet TAKE 1 TABLET DAILY FOR    BLOOD PRESSURE  . cholecalciferol (VITAMIN D) 1000 UNITS tablet Take 1,000 Units by mouth daily. Take 5000 IU daily.  . enalapril (VASOTEC) 20 MG tablet TAKE 1 TABLET DAILY  . EPIDUO 0.1-2.5 % gel   . omeprazole (PRILOSEC) 40 MG capsule TAKE 1 CAPSULE DAILY FOR   ACID REFLUX  . rosuvastatin (CRESTOR) 20 MG tablet TAKE 1 TABLET DAILY  . testosterone cypionate (DEPOTESTOSTERONE CYPIONATE) 200 MG/ML injection Inject 2 mLs (400 mg total) into the muscle every 14 (fourteen) days.  . TRINTELLIX 20 MG TABS tablet TAKE 1 TABLET DAILY   No current facility-administered medications on file prior to visit.     Medical History:  Past Medical History:  Diagnosis Date  . Anxiety   . Depression   . GERD (gastroesophageal reflux disease)   . Hyperlipidemia   . Hypertension   . Hypogonadism male   . Obesity   . OSA on CPAP 2019  . Vitamin D deficiency    Allergies:  Allergies  Allergen Reactions  . Citalopram Other (See Comments)    anxiety  . Pravastatin   . Wellbutrin  [Bupropion]      Review of Systems:  Review of Systems  Constitutional: Negative.   HENT: Negative.   Eyes: Negative.   Respiratory: Negative.   Cardiovascular: Negative.   Gastrointestinal: Negative.   Genitourinary: Negative.   Musculoskeletal: Negative.   Skin: Negative.   Neurological: Negative.   Endo/Heme/Allergies: Negative.   Psychiatric/Behavioral: Negative.     Family history- Review and unchanged Social history- Review and unchanged Physical Exam: BP 124/84   Pulse 84   Temp (!) 97.3 F (36.3 C)   Ht 6\' 4"  (1.93 m)   Wt 235 lb (106.6 kg)   SpO2 99%   BMI 28.61 kg/m  Wt Readings from Last 3 Encounters:  04/16/18 235 lb (106.6 kg)  01/08/18 240 lb 12.8 oz (109.2 kg)  11/29/17 235 lb 6.4 oz (106.8 kg)   General Appearance: Well nourished, in no apparent distress. Eyes: PERRLA, EOMs, conjunctiva no swelling or erythema Sinuses: No Frontal/maxillary tenderness ENT/Mouth: Ext aud canals clear, TMs without erythema, bulging. No erythema, swelling, or exudate on  post pharynx.  Tonsils not swollen or erythematous. Hearing normal.  Neck: Supple, thyroid normal.  Respiratory: Respiratory effort normal, BS equal bilaterally without rales, rhonchi, wheezing or stridor.  Cardio: RRR with no MRGs. Brisk peripheral pulses without edema.  Abdomen: Soft, + BS,  Non tender, no guarding, rebound, hernias, masses. Lymphatics: Non tender without lymphadenopathy.  Musculoskeletal: Full ROM, 5/5 strength, Normal gait Skin: Warm, dry without rashes, lesions, ecchymosis.  Neuro: Cranial nerves intact. Normal muscle tone, no cerebellar symptoms. Psych: Awake and oriented X 3, normal affect, Insight and Judgment appropriate.    Dan Maker, NP 4:09 PM [facility]

## 2018-04-17 ENCOUNTER — Other Ambulatory Visit: Payer: Self-pay | Admitting: Adult Health

## 2018-04-17 LAB — CBC WITH DIFFERENTIAL/PLATELET
ABSOLUTE MONOCYTES: 540 {cells}/uL (ref 200–950)
BASOS PCT: 0.3 %
Basophils Absolute: 23 cells/uL (ref 0–200)
EOS ABS: 53 {cells}/uL (ref 15–500)
Eosinophils Relative: 0.7 %
HCT: 45.6 % (ref 38.5–50.0)
Hemoglobin: 15.8 g/dL (ref 13.2–17.1)
Lymphs Abs: 1839 cells/uL (ref 850–3900)
MCH: 32.9 pg (ref 27.0–33.0)
MCHC: 34.6 g/dL (ref 32.0–36.0)
MCV: 95 fL (ref 80.0–100.0)
MONOS PCT: 7.1 %
MPV: 11.5 fL (ref 7.5–12.5)
NEUTROS PCT: 67.7 %
Neutro Abs: 5145 cells/uL (ref 1500–7800)
Platelets: 196 10*3/uL (ref 140–400)
RBC: 4.8 10*6/uL (ref 4.20–5.80)
RDW: 11.6 % (ref 11.0–15.0)
TOTAL LYMPHOCYTE: 24.2 %
WBC: 7.6 10*3/uL (ref 3.8–10.8)

## 2018-04-17 LAB — COMPLETE METABOLIC PANEL WITH GFR
AG Ratio: 2 (calc) (ref 1.0–2.5)
ALBUMIN MSPROF: 4.8 g/dL (ref 3.6–5.1)
ALT: 25 U/L (ref 9–46)
AST: 25 U/L (ref 10–40)
Alkaline phosphatase (APISO): 41 U/L (ref 36–130)
BUN: 18 mg/dL (ref 7–25)
CALCIUM: 10.1 mg/dL (ref 8.6–10.3)
CO2: 27 mmol/L (ref 20–32)
CREATININE: 1.17 mg/dL (ref 0.60–1.35)
Chloride: 101 mmol/L (ref 98–110)
GFR, EST NON AFRICAN AMERICAN: 76 mL/min/{1.73_m2} (ref >=60)
GFR, Est African American: 89 mL/min/{1.73_m2} (ref >=60)
GLOBULIN: 2.4 g/dL (ref 1.9–3.7)
GLUCOSE: 92 mg/dL (ref 65–99)
Potassium: 4.1 mmol/L (ref 3.5–5.3)
Sodium: 139 mmol/L (ref 135–146)
Total Bilirubin: 0.9 mg/dL (ref 0.2–1.2)
Total Protein: 7.2 g/dL (ref 6.1–8.1)

## 2018-04-17 LAB — LIPID PANEL
CHOL/HDL RATIO: 4.3 (calc) (ref <5.0)
CHOLESTEROL: 206 mg/dL — AB (ref <200)
HDL: 48 mg/dL (ref >=40)
LDL CHOLESTEROL (CALC): 119 mg/dL — AB
Non-HDL Cholesterol (Calc): 158 mg/dL (calc) — ABNORMAL HIGH (ref <130)
TRIGLYCERIDES: 274 mg/dL — AB (ref <150)

## 2018-04-17 LAB — TSH: TSH: 1.29 mIU/L (ref 0.40–4.50)

## 2018-04-17 LAB — MAGNESIUM: Magnesium: 2.1 mg/dL (ref 1.5–2.5)

## 2018-04-17 LAB — VITAMIN D 25 HYDROXY (VIT D DEFICIENCY, FRACTURES): Vit D, 25-Hydroxy: 41 ng/mL (ref 30–100)

## 2018-04-17 MED ORDER — ROSUVASTATIN CALCIUM 20 MG PO TABS: ORAL_TABLET | ORAL | 1 refills | Status: DC

## 2018-05-15 ENCOUNTER — Other Ambulatory Visit: Payer: Self-pay | Admitting: Physician Assistant

## 2018-06-03 ENCOUNTER — Other Ambulatory Visit: Payer: Self-pay | Admitting: Internal Medicine

## 2018-07-02 ENCOUNTER — Encounter: Payer: Self-pay | Admitting: Adult Health Nurse Practitioner

## 2018-07-02 ENCOUNTER — Encounter: Payer: Self-pay | Admitting: Physician Assistant

## 2018-08-24 ENCOUNTER — Encounter: Payer: Self-pay | Admitting: Adult Health

## 2018-08-26 ENCOUNTER — Telehealth: Payer: Self-pay | Admitting: Adult Health

## 2018-08-26 NOTE — Telephone Encounter (Signed)
I called patient regarding his 7/7 appt with NP Megan. I offered patient a virtual visit through mychart, since he has an account. Patient accepted a virtual visit and I have sent patient the instructions via mychart. Patient verbalized understanding.  Pt understands that although there may be some limitations with this type of visit, we will take all precautions to reduce any security or privacy concerns.  Pt understands that this will be treated like an in office visit and we will file with pt's insurance, and there may be a patient responsible charge related to this service.

## 2018-08-26 NOTE — Telephone Encounter (Signed)
Noted  

## 2018-08-27 ENCOUNTER — Telehealth (INDEPENDENT_AMBULATORY_CARE_PROVIDER_SITE_OTHER): Payer: 59 | Admitting: Adult Health

## 2018-08-27 DIAGNOSIS — G4733 Obstructive sleep apnea (adult) (pediatric): Secondary | ICD-10-CM | POA: Diagnosis not present

## 2018-08-27 DIAGNOSIS — Z9989 Dependence on other enabling machines and devices: Secondary | ICD-10-CM

## 2018-08-27 NOTE — Progress Notes (Signed)
PATIENT: Ian Zavala DOB: August 23, 1975  REASON FOR VISIT: follow up HISTORY FROM: patient  Virtual Visit via Video Note  I connected with Ian Zavala on 08/27/18 at  2:00 PM EDT by a video enabled telemedicine application located remotely at [facility] and verified that I am speaking with the correct person using two identifiers who was located at their own home.   I discussed the limitations of evaluation and management by telemedicine and the availability of in person appointments. The patient expressed understanding and agreed to proceed.   PATIENT: Ian Zavala DOB: [birth year]  REASON FOR VISIT: follow up HISTORY FROM: patient  HISTORY OF PRESENT ILLNESS: Today 08/27/18:  Ian Zavala is a 43 year old male with a history of obstructive sleep apnea on CPAP.  He returns today for follow-up.  His CPAP download indicates that he uses machine nightly for compliance of 100%.  He uses machine greater than 4 hours 28 days for compliance of 93%.  On average he uses his machine 7 hours and 45 minutes.  His residual AHI is 5.3 on 5 to 15 cm of water with EPR of 3.  His leak in the 95th percentile is 58.5 L/min.  He states that the straps to his mask are worn out.  Reports that he has order new supplies and is currently waiting on it.  He joins me today for virtual visit  HISTORY 01/08/18:  Ian Zavala is a 43 year old male with a history of obstructive sleep apnea on CPAP.  He returns today for follow-up.  His CPAP download indicates machine 30 out of 30 days for compliance of 100%.  He uses machine greater than 4 hours each night.  On average he uses his machine 8 hours and 15 minutes.  His residual AHI is 3.3 on 5-15 cmH2O with EPR 3.  His leak in the 95th percentile is 25.5 L/min.  He states that sometimes during the night he will take his mask off but not from the machine off to have a drink of water.  He is unsure if this is affecting his leak  time.  Overall he notices the benefit with using the CPAP.  He states that he does not feel the mask leaking.  His Epworth sleepiness score is 8 he returns today for evaluation.  REVIEW OF SYSTEMS: Out of a complete 14 system review of symptoms, the patient complains only of the following symptoms, and all other reviewed systems are negative.  See HPI  ALLERGIES: Allergies  Allergen Reactions  . Citalopram Other (See Comments)    anxiety  . Pravastatin   . Wellbutrin [Bupropion]     HOME MEDICATIONS: Outpatient Medications Prior to Visit  Medication Sig Dispense Refill  . ALPRAZolam (XANAX) 1 MG tablet TAKE 1/2 TO 1 TABLET BY MOUTH 3 TIMES A DAY AS NEEDED TRY TO CUT BACK ON THE AMOUNT, SHOULD LAST LONGER THAN A MONTH 90 tablet 3  . bisoprolol-hydrochlorothiazide (ZIAC) 10-6.25 MG tablet TAKE 1 TABLET DAILY FOR    BLOOD PRESSURE 90 tablet 1  . cholecalciferol (VITAMIN D) 1000 UNITS tablet Take 1,000 Units by mouth daily. Take 5000 IU daily.    . enalapril (VASOTEC) 20 MG tablet TAKE 1 TABLET DAILY 90 tablet 0  . EPIDUO 0.1-2.5 % gel     . Magnesium 400 MG CAPS Take 1 capsule by mouth daily.    . Omega-3 Fatty Acids (FISH OIL) 1000 MG CAPS Take 2,000 mg by mouth daily.    Marland Kitchen  omeprazole (PRILOSEC) 40 MG capsule TAKE 1 CAPSULE DAILY FOR   ACID REFLUX 90 capsule 3  . rosuvastatin (CRESTOR) 20 MG tablet Take 1 tab in the evening 3 days a week for cholesterol. 90 tablet 1  . testosterone cypionate (DEPOTESTOSTERONE CYPIONATE) 200 MG/ML injection Inject 2 mLs (400 mg total) into the muscle every 14 (fourteen) days. 14 mL 1  . TRINTELLIX 20 MG TABS tablet TAKE 1 TABLET DAILY 90 tablet 0   No facility-administered medications prior to visit.     PAST MEDICAL HISTORY: Past Medical History:  Diagnosis Date  . Anxiety   . Depression   . GERD (gastroesophageal reflux disease)   . Hyperlipidemia   . Hypertension   . Hypogonadism male   . Obesity   . OSA on CPAP 2019  . Vitamin D  deficiency     PAST SURGICAL HISTORY: No past surgical history on file.  FAMILY HISTORY: Family History  Problem Relation Age of Onset  . Hypertension Mother   . Depression Mother   . Fibromyalgia Mother   . Depression Brother   . Hypertension Brother   . Cancer Father [id number]       urethral/lung cancer  . Depression Sister     SOCIAL HISTORY: Social History   Socioeconomic History  . Marital status: Married    Spouse name: Not on file  . Number of children: Not on file  . Years of education: Not on file  . Highest education level: Not on file  Occupational History  . Not on file  Social Needs  . Financial resource strain: Not on file  . Food insecurity    Worry: Not on file    Inability: Not on file  . Transportation needs    Medical: Not on file    Non-medical: Not on file  Tobacco Use  . Smoking status: Never Smoker  . Smokeless tobacco: Never Used  Substance and Sexual Activity  . Alcohol use: Yes    Types: 12 drink(s) per week  . Drug use: No  . Sexual activity: Yes  Lifestyle  . Physical activity    Days per week: Not on file    Minutes per session: Not on file  . Stress: Not on file  Relationships  . Social Musicianconnections    Talks on phone: Not on file    Gets together: Not on file    Attends religious service: Not on file    Active member of club or organization: Not on file    Attends meetings of clubs or organizations: Not on file    Relationship status: Not on file  . Intimate partner violence    Fear of current or ex partner: Not on file    Emotionally abused: Not on file    Physically abused: Not on file    Forced sexual activity: Not on file  Other Topics Concern  . Not on file  Social History Narrative  . Not on file      PHYSICAL EXAM Generalized: Well developed, in no acute distress   Neurological examination  Mentation: Alert oriented to time, place, history taking. Follows all commands speech and language fluent Cranial nerve  II-XII:Extraocular movements were full. Facial symmetry noted. Head turning and shoulder shrug  were normal and symmetric. Motor: Good strength throughout subjectively per patient Sensory: Sensory testing is intact to soft touch on all 4 extremities subjectively per patient Coordination: Cerebellar testing reveals good finger-nose-finger  Reflexes: UTA  DIAGNOSTIC DATA (LABS, IMAGING,  TESTING) - I reviewed patient records, labs, notes, testing and imaging myself where available.  Lab Results  Component Value Date   WBC 7.6 04/16/2018   HGB 15.8 04/16/2018   HCT 45.6 04/16/2018   MCV 95.0 04/16/2018   PLT 196 04/16/2018      Component Value Date/Time   NA 139 04/16/2018 1707   K 4.1 04/16/2018 1707   CL 101 04/16/2018 1707   CO2 27 04/16/2018 1707   GLUCOSE 92 04/16/2018 1707   BUN 18 04/16/2018 1707   CREATININE 1.17 04/16/2018 1707   CALCIUM 10.1 04/16/2018 1707   PROT 7.2 04/16/2018 1707   ALBUMIN 4.5 06/26/2016 1627   AST 25 04/16/2018 1707   ALT 25 04/16/2018 1707   ALKPHOS 46 06/26/2016 1627   BILITOT 0.9 04/16/2018 1707   GFRNONAA 76 04/16/2018 1707   GFRAA 89 04/16/2018 1707   Lab Results  Component Value Date   CHOL 206 (H) 04/16/2018   HDL 48 04/16/2018   LDLCALC 119 (H) 04/16/2018   TRIG 274 (H) 04/16/2018   CHOLHDL 4.3 04/16/2018   Lab Results  Component Value Date   HGBA1C 4.9 11/29/2017   Lab Results  Component Value Date   VITAMINB12 638 06/26/2016   Lab Results  Component Value Date   TSH 1.29 04/16/2018      ASSESSMENT AND PLAN 43 y.o. year old male  has a past medical history of Anxiety, Depression, GERD (gastroesophageal reflux disease), Hyperlipidemia, Hypertension, Hypogonadism male, Obesity, OSA on CPAP (2019), and Vitamin D deficiency. here with:  1.  Obstructive sleep apnea on CPAP  The patient CPAP download shows excellent compliance however his residual AHI is slightly elevated and the patient has a high leak.  Hopefully with  new supplies this resolves his leak.  I will get another download in 30 days.  If there is no improvement in his leak we will order a mask refitting.  Patient voiced understanding.  He will follow-up in 6 months with another virtual visit.   I spent 15 minutes with the patient this time was spent reviewing his CPAP download   Butch PennyMegan Malon Siddall, MSN, NP-C 08/27/2018, 2:20 PM [facility] [address], [address] [place], [place] [address] [phone]

## 2018-08-28 ENCOUNTER — Other Ambulatory Visit: Payer: Self-pay | Admitting: Adult Health

## 2018-08-28 ENCOUNTER — Other Ambulatory Visit: Payer: Self-pay | Admitting: Internal Medicine

## 2018-09-03 ENCOUNTER — Encounter: Payer: Self-pay | Admitting: Adult Health Nurse Practitioner

## 2018-09-09 ENCOUNTER — Other Ambulatory Visit: Payer: Self-pay | Admitting: Physician Assistant

## 2018-09-09 ENCOUNTER — Ambulatory Visit: Payer: 59 | Admitting: Physician Assistant

## 2018-09-09 ENCOUNTER — Other Ambulatory Visit: Payer: Self-pay

## 2018-09-09 ENCOUNTER — Encounter: Payer: Self-pay | Admitting: Physician Assistant

## 2018-09-09 VITALS — BP 158/102 | HR 71 | Temp 98.1°F | Ht 76.0 in | Wt 239.0 lb

## 2018-09-09 DIAGNOSIS — R7309 Other abnormal glucose: Secondary | ICD-10-CM

## 2018-09-09 DIAGNOSIS — I10 Essential (primary) hypertension: Secondary | ICD-10-CM | POA: Diagnosis not present

## 2018-09-09 DIAGNOSIS — Z79899 Other long term (current) drug therapy: Secondary | ICD-10-CM

## 2018-09-09 DIAGNOSIS — E559 Vitamin D deficiency, unspecified: Secondary | ICD-10-CM

## 2018-09-09 DIAGNOSIS — K219 Gastro-esophageal reflux disease without esophagitis: Secondary | ICD-10-CM

## 2018-09-09 DIAGNOSIS — F418 Other specified anxiety disorders: Secondary | ICD-10-CM

## 2018-09-09 DIAGNOSIS — Z136 Encounter for screening for cardiovascular disorders: Secondary | ICD-10-CM

## 2018-09-09 DIAGNOSIS — E663 Overweight: Secondary | ICD-10-CM

## 2018-09-09 DIAGNOSIS — Z0001 Encounter for general adult medical examination with abnormal findings: Secondary | ICD-10-CM

## 2018-09-09 DIAGNOSIS — G4730 Sleep apnea, unspecified: Secondary | ICD-10-CM

## 2018-09-09 DIAGNOSIS — E782 Mixed hyperlipidemia: Secondary | ICD-10-CM

## 2018-09-09 DIAGNOSIS — Z130 Encounter for screening for diseases of the blood and blood-forming organs and certain disorders involving the immune mechanism: Secondary | ICD-10-CM

## 2018-09-09 DIAGNOSIS — Z0000 Encounter for general adult medical examination without abnormal findings: Secondary | ICD-10-CM

## 2018-09-09 DIAGNOSIS — E291 Testicular hypofunction: Secondary | ICD-10-CM

## 2018-09-09 DIAGNOSIS — N529 Male erectile dysfunction, unspecified: Secondary | ICD-10-CM

## 2018-09-09 MED ORDER — ENALAPRIL MALEATE 20 MG PO TABS: 40.0000 mg | ORAL_TABLET | Freq: Every day | ORAL | 0 refills | Status: DC

## 2018-09-09 MED ORDER — ALPRAZOLAM 1 MG PO TABS: ORAL_TABLET | ORAL | 0 refills | Status: DC

## 2018-09-09 MED ORDER — NALTREXONE HCL 50 MG PO TABS: 50.0000 mg | ORAL_TABLET | Freq: Every day | ORAL | 0 refills | Status: DC

## 2018-09-09 MED ORDER — TESTOSTERONE CYPIONATE 200 MG/ML IM SOLN: 400.0000 mg | INTRAMUSCULAR | 1 refills | Status: DC

## 2018-09-09 NOTE — Progress Notes (Signed)
Complete Physical  Assessment and Plan:   Encounter for general adult medical examination with abnormal findings 1 YEAR  Medication management -     Magnesium  Essential hypertension -     CBC with Differential/Platelet -     COMPLETE METABOLIC PANEL WITH GFR -     TSH -     Urinalysis, Routine w reflex microscopic -     Microalbumin / creatinine urine ratio -     EKG 12-Lead -     enalapril (VASOTEC) 20 MG tablet; Take 2 tablets (40 mg total) by mouth daily. - continue medications, DASH diet, exercise and monitor at home. Call if greater than 130/80.  INCREASE ENALAPRIL TO 40MG  A DAY, WORK ON WEIGHT LOSS/EXERCISE  Hyperlipidemia -     Lipid panel check lipids decrease fatty foods increase activity.   Testosterone Deficiency Hypogonadism- continue replacement therapy, check testosterone levels as needed.   Other abnormal glucose -     Hemoglobin A1c Discussed disease progression and risks Discussed diet/exercise, weight management and risk modification  Overweight (BMI 25.0-29.9) -     naltrexone (DEPADE) 50 MG tablet; Take 1 tablet (50 mg total) by mouth daily. - follow up 3 months for progress monitoring - increase veggies, decrease carbs - long discussion about weight loss, diet, and exercise  Vitamin D deficiency -     VITAMIN D 25 Hydroxy (Vit-D Deficiency, Fractures)  Depression with anxiety -     ALPRAZolam (XANAX) 1 MG tablet; TAKE 1/2 TO 1 TABLET BY MOUTH 3 TIMES A DAY AS NEEDED TRY TO CUT BACK ON THE AMOUNT, SHOULD LAST LONGER THAN A MONTH -     naltrexone (DEPADE) 50 MG tablet; Take 1 tablet (50 mg total) by mouth daily. - try to help cut back on drinking - had 1 episode of palpitations, EKG normal, no other symptoms- discussed if happens again with drinking, OSA want to get holter to rule out arrythmia  Gastroesophageal reflux disease, esophagitis presence not specified Continue PPI/H2 blocker, diet discussed  Sleep apnea, unspecified type Continue  CPAP  Screening, anemia, deficiency, iron -     Cancel: Iron,Total/Total Iron Binding Cap  Hypogonadism in male -     testosterone cypionate (DEPOTESTOSTERONE CYPIONATE) 200 MG/ML injection; Inject 2 mLs (400 mg total) into the muscle every 14 (fourteen) days.    Discussed med's effects and SE's. Screening labs and tests as requested with regular follow-up as recommended. Over 40 minutes of exam, counseling, chart review and critical decision making was performed Future Appointments  Date Time Provider Department Center  10/07/2018  3:30 PM Quentin MullingCollier, Lovel Suazo, PA-C [facility] None  03/11/2019  2:30 PM Butch PennyMillikan, Megan, NP [facility] None  09/15/2019  2:00 PM Quentin Mullingollier, Kaydee Magel, PA-C [facility] None    HPI Patient presents for a complete physical.   His blood pressure has been controlled at home, today their BP is BP: (!) 158/102 He does workout. He denies chest pain, shortness of breath.   He was at work the other morning and about 6 AM he started to feel his heart racing, felt mentally foggy, anxious, lasted x 10 mins, took xanax and that helped it. Has not happened again. No CP, SOB with exertion.   He is working two days a week. States his diet has been bad and mainly working at night. He has not been exercising with gym closed. He has started back on trintellix due to stress/pandemic. He is on CPAP and states it is helping with his energy. He  will take 1/2 xanax at night and occ during the day.  He admits to drinking more beer, 3-6 beers (IPAs) a night.   BMI is Body mass index is 28.45 kg/m., he is working on diet and exercise.  His waist is 42.5 inches, goal is less than 40, weight goal 220.  Wt Readings from Last 3 Encounters:  09/09/18 239 lb (108.4 kg)  04/16/18 235 lb (106.6 kg)  01/08/18 240 lb 12.8 oz (109.2 kg)   He is on cholesterol medication, crestor and denies myalgias. His cholesterol is not at goal. The cholesterol last visit was:   Lab Results  Component Value Date    CHOL 206 (H) 04/16/2018   HDL 48 04/16/2018   LDLCALC 119 (H) 04/16/2018   TRIG 274 (H) 04/16/2018   CHOLHDL 4.3 04/16/2018    Last A1C in the office was:  Lab Results  Component Value Date   HGBA1C 4.9 11/29/2017   Patient is on Vitamin D supplement.   Lab Results  Component Value Date   VD25OH 41 04/16/2018     Last PSA was: Lab Results  Component Value Date   PSA 0.4 06/26/2016   He has a history of testosterone deficiency and is on testosterone replacement, due tomorrow, does every 2 weeks. He states that the testosterone helps with his energy, libido, muscle mass. Lab Results  Component Value Date   TESTOSTERONE 1,576 (H) 06/27/2017     Current Medications:  Current Outpatient Medications on File Prior to Visit  Medication Sig Dispense Refill  . bisoprolol-hydrochlorothiazide (ZIAC) 10-6.25 MG tablet TAKE 1 TABLET DAILY FOR    BLOOD PRESSURE 90 tablet 1  . cholecalciferol (VITAMIN D) 1000 UNITS tablet Take 1,000 Units by mouth daily. Take 5000 IU daily.    . Magnesium 400 MG CAPS Take 1 capsule by mouth daily.    . Omega-3 Fatty Acids (FISH OIL) 1000 MG CAPS Take 2,000 mg by mouth daily.    Marland Kitchen. omeprazole (PRILOSEC) 40 MG capsule TAKE 1 CAPSULE DAILY FOR   ACID REFLUX 90 capsule 1  . rosuvastatin (CRESTOR) 20 MG tablet Take 1 tab in the evening 3 days a week for cholesterol. 90 tablet 1  . TRINTELLIX 20 MG TABS tablet TAKE 1 TABLET DAILY 90 tablet 0  . EPIDUO 0.1-2.5 % gel      No current facility-administered medications on file prior to visit.    Allergies:  Allergies  Allergen Reactions  . Citalopram Other (See Comments)    anxiety  . Pravastatin   . Wellbutrin [Bupropion]    Health Maintenance:  Immunization History  Administered Date(s) Administered  . HPV 9-valent 06/27/2017  . Influenza Inj Mdck Quad With Preservative 11/08/2016  . Influenza Whole 12/19/2011  . Influenza, Seasonal, Injecte, Preservative Fre 02/02/2015  . PPD Test 01/09/2013,  04/06/2015  . Td 02/21/2004  . Tdap 04/06/2015   Tetanus: 2017 Pneumovax: N/A Prevnar 13: N/A Flu vaccine: 2016 Zostavax: N/A HPV: will start today  DEXA: Colonoscopy: N/A EGD: Eye Exam: Dentist:  Patient Care Team: Lucky CowboyMcKeown, William, MD as PCP - General (Internal Medicine) Lucky CowboyMcKeown, William, MD (Internal Medicine)  Medical History:  has Essential hypertension; Hyperlipidemia; Testosterone Deficiency; Vitamin D deficiency; Depression with anxiety; Overweight (BMI 25.0-29.9); Other abnormal glucose; Medication management; Esophageal reflux; and Sleep apnea on their problem list. Surgical History:  He  has no past surgical history on file. Family History:  His family history includes Cancer (age of onset: 1770) in his father; Depression in his brother,  mother, and sister; Fibromyalgia in his mother; Hypertension in his brother and mother. Social History:   reports that he has never smoked. He has never used smokeless tobacco. He reports current alcohol use. He reports that he does not use drugs.  Review of Systems:  Review of Systems  Constitutional: Negative.   HENT: Negative.   Eyes: Negative.   Respiratory: Negative.   Cardiovascular: Negative.   Gastrointestinal: Negative.   Genitourinary: Negative.   Musculoskeletal: Negative.   Skin: Positive for rash (flushing on face with wind/alcohol).    Physical Exam: Estimated body mass index is 29.09 kg/m as calculated from the following:   Height as of this encounter: 6\' 4"  (1.93 m).   Weight as of this encounter: 239 lb (108.4 kg). BP (!) 158/102   Pulse 71   Temp 98.1 F (36.7 C)   Ht 6\' 4"  (1.93 m)   Wt 239 lb (108.4 kg)   SpO2 98%   BMI 29.09 kg/m  General Appearance: Well nourished, in no apparent distress.  Eyes: PERRLA, EOMs, conjunctiva no swelling or erythema, normal fundi and vessels.  Sinuses: No Frontal/maxillary tenderness  ENT/Mouth: Ext aud canals clear, normal light reflex with TMs without erythema,  bulging. Good dentition. No erythema, swelling, or exudate on post pharynx. Tonsils not swollen or erythematous. Hearing normal.  Neck: Supple, thyroid normal. No bruits  Respiratory: Respiratory effort normal, BS equal bilaterally without rales, rhonchi, wheezing or stridor.  Cardio: RRR without murmurs, rubs or gallops. Brisk peripheral pulses without edema.  Chest: symmetric, with normal excursions and percussion.  Abdomen: Soft, nontender, no guarding, rebound, hernias, masses, or organomegaly.  Lymphatics: Non tender without lymphadenopathy.  Genitourinary: normal, no blood, non tender prostate Musculoskeletal: Full ROM all peripheral extremities,5/5 strength, and normal gait.  Skin: Warm, dry without rashes, lesions, ecchymosis. Neuro: Cranial nerves intact, reflexes equal bilaterally. Normal muscle tone, no cerebellar symptoms. Sensation intact.  Psych: Awake and oriented X 3, normal affect, Insight and Judgment appropriate.   EKG: defer AORTA SCAN: defer  Vicie Mutters 4:45 PM [facility]

## 2018-09-09 NOTE — Patient Instructions (Addendum)
Naltrexone take 1/2 pill at night with dinner, can go up to a whole pill after 1 week  You CAN NOT use opioids 7-10 days before treatment with this or during treatment OR if will cause withdrawal symptoms and CAN hurt you   Naltrexone can help block craving for opioids and alcohol, I still suggest that you go to NA or AA and seek counseling but this is an aid to help you.   Start to work out again.   HYPERTENSION INFORMATION  Take 2 of the enalapril and monitor your blood pressure  Monitor your blood pressure at home, please keep a record and bring that in with you to your next office visit.   Go to the ER if any CP, SOB, nausea, dizziness, severe HA, changes vision/speech  Testing/Procedures: HOW TO TAKE YOUR BLOOD PRESSURE:  Rest 5 minutes before taking your blood pressure.  Don't smoke or drink caffeinated beverages for at least 30 minutes before.  Take your blood pressure before (not after) you eat.  Sit comfortably with your back supported and both feet on the floor (don't cross your legs).  Elevate your arm to heart level on a table or a desk.  Use the proper sized cuff. It should fit smoothly and snugly around your bare upper arm. There should be enough room to slip a fingertip under the cuff. The bottom edge of the cuff should be 1 inch above the crease of the elbow.  Due to a recent study, SPRINT, we have changed our goal for the systolic or top blood pressure number. Ideally we want your top number at 120.  [facility], 5000 people were randomized to a goal BP of 120 and 5000 people were randomized to a goal BP of less than 140. The patients with the goal BP at 120 had LESS DEMENTIA, LESS HEART ATTACKS, AND LESS STROKES, AS WELL AS OVERALL DECREASED MORTALITY OR DEATH RATE.   There was another study that showed taking your blood pressure medications at night decrease cardiovascular events.  However if you are on a fluid pill, please take this in the morning.    If you are willing, our goal BP is the top number of 120.  Your most recent BP: BP: (!) 158/102   Take your medications faithfully as instructed. Maintain a healthy weight. Get at least 150 minutes of aerobic exercise per week. Minimize salt intake. Minimize alcohol intake  DASH Eating Plan DASH stands for "Dietary Approaches to Stop Hypertension." The DASH eating plan is a healthy eating plan that has been shown to reduce high blood pressure (hypertension). Additional health benefits may include reducing the risk of type 2 diabetes mellitus, heart disease, and stroke. The DASH eating plan may also help with weight loss. WHAT DO I NEED TO KNOW ABOUT THE DASH EATING PLAN? For the DASH eating plan, you will follow these general guidelines:  Choose foods with a percent daily value for sodium of less than 5% (as listed on the food label).  Use salt-free seasonings or herbs instead of table salt or sea salt.  Check with your health care provider or pharmacist before using salt substitutes.  Eat lower-sodium products, often labeled as "lower sodium" or "no salt added."  Eat fresh foods.  Eat more vegetables, fruits, and low-fat dairy products.  Choose whole grains. Look for the word "whole" as the first word in the ingredient list.  Choose fish and skinless chicken or [place] more often than red meat. Limit  fish, poultry, and meat to 6 oz (170 g) each day.  Limit sweets, desserts, sugars, and sugary drinks.  Choose heart-healthy fats.  Limit cheese to 1 oz (28 g) per day.  Eat more home-cooked food and less restaurant, buffet, and fast food.  Limit fried foods.  Cook foods using methods other than frying.  Limit canned vegetables. If you do use them, rinse them well to decrease the sodium.  When eating at a restaurant, ask that your food be prepared with less salt, or no salt if possible. WHAT FOODS CAN I EAT? Seek help from a dietitian for individual calorie  needs. Grains Whole grain or whole wheat bread. Brown rice. Whole grain or whole wheat pasta. Quinoa, bulgur, and whole grain cereals. Low-sodium cereals. Corn or whole wheat flour tortillas. Whole grain cornbread. Whole grain crackers. Low-sodium crackers. Vegetables Fresh or frozen vegetables (raw, steamed, roasted, or grilled). Low-sodium or reduced-sodium tomato and vegetable juices. Low-sodium or reduced-sodium tomato sauce and paste. Low-sodium or reduced-sodium canned vegetables.  Fruits All fresh, canned (in natural juice), or frozen fruits. Meat and Other Protein Products Ground beef (85% or leaner), grass-fed beef, or beef trimmed of fat. Skinless chicken or [place]. Ground chicken or [place]. Pork trimmed of fat. All fish and seafood. Eggs. Dried beans, peas, or lentils. Unsalted nuts and seeds. Unsalted canned beans. Dairy Low-fat dairy products, such as skim or 1% milk, 2% or reduced-fat cheeses, low-fat ricotta or cottage cheese, or plain low-fat yogurt. Low-sodium or reduced-sodium cheeses. Fats and Oils Tub margarines without trans fats. Light or reduced-fat mayonnaise and salad dressings (reduced sodium). Avocado. Safflower, olive, or canola oils. Natural peanut or almond butter. Other Unsalted popcorn and pretzels. The items listed above may not be a complete list of recommended foods or beverages. Contact your dietitian for more options. WHAT FOODS ARE NOT RECOMMENDED? Grains White bread. White pasta. White rice. Refined cornbread. Bagels and croissants. Crackers that contain trans fat. Vegetables Creamed or fried vegetables. Vegetables in a cheese sauce. Regular canned vegetables. Regular canned tomato sauce and paste. Regular tomato and vegetable juices. Fruits Dried fruits. Canned fruit in light or heavy syrup. Fruit juice. Meat and Other Protein Products Fatty cuts of meat. Ribs, chicken wings, bacon, sausage, bologna, salami, chitterlings, fatback, hot dogs, bratwurst,  and packaged luncheon meats. Salted nuts and seeds. Canned beans with salt. Dairy Whole or 2% milk, cream, half-and-half, and cream cheese. Whole-fat or sweetened yogurt. Full-fat cheeses or blue cheese. Nondairy creamers and whipped toppings. Processed cheese, cheese spreads, or cheese curds. Condiments Onion and garlic salt, seasoned salt, table salt, and sea salt. Canned and packaged gravies. Worcestershire sauce. Tartar sauce. Barbecue sauce. Teriyaki sauce. Soy sauce, including reduced sodium. Steak sauce. Fish sauce. Oyster sauce. Cocktail sauce. Horseradish. Ketchup and mustard. Meat flavorings and tenderizers. Bouillon cubes. Hot sauce. Tabasco sauce. Marinades. Taco seasonings. Relishes. Fats and Oils Butter, stick margarine, lard, shortening, ghee, and bacon fat. Coconut, palm kernel, or palm oils. Regular salad dressings. Other Pickles and olives. Salted popcorn and pretzels. The items listed above may not be a complete list of foods and beverages to avoid. Contact your dietitian for more information. WHERE CAN I FIND MORE INFORMATION? National Heart, Lung, and Blood Institute: travelstabloid.com Document Released: 01/26/2011 Document Revised: 06/23/2013 Document Reviewed: 12/11/2012 [facility] Patient Information 2015 [place], [place]. This information is not intended to replace advice given to you by your health care provider. Make sure you discuss any questions you have with your health care provider.  Palpitations Palpitations are feelings that your heartbeat is irregular or is faster than normal. It may feel like your heart is fluttering or skipping a beat. Palpitations are usually not a serious problem. They may be caused by many things, including smoking, caffeine, alcohol, stress, and certain medicines or drugs. Most causes of palpitations are not serious. However, some palpitations can be a sign of a serious problem. You may need further tests to  rule out serious medical problems. Follow these instructions at home:     Pay attention to any changes in your condition. Take these actions to help manage your symptoms: Eating and drinking  Avoid foods and drinks that may cause palpitations. These may include: ? Caffeinated coffee, tea, soft drinks, diet pills, and energy drinks. ? Chocolate. ? Alcohol. Lifestyle  Take steps to reduce your stress and anxiety. Things that can help you relax include: ? Yoga. ? Mind-body activities, such as deep breathing, meditation, or using words and images to create positive thoughts (guided imagery). ? Physical activity, such as swimming, jogging, or walking. Tell your health care provider if your palpitations increase with activity. If you have chest pain or shortness of breath with activity, do not continue the activity until you are seen by your health care provider. ? Biofeedback. This is a method that helps you learn to use your mind to control things in your body, such as your heartbeat.  Do not use drugs, including cocaine or ecstasy. Do not use marijuana.  Get plenty of rest and sleep. Keep a regular bed time. General instructions  Take over-the-counter and prescription medicines only as told by your health care provider.  Do not use any products that contain nicotine or tobacco, such as cigarettes and e-cigarettes. If you need help quitting, ask your health care provider.  Keep all follow-up visits as told by your health care provider. This is important. These may include visits for further testing if palpitations do not go away or get worse. Contact a health care provider if you:  Continue to have a fast or irregular heartbeat after 24 hours.  Notice that your palpitations occur more often. Get help right away if you:  Have chest pain or shortness of breath.  Have a severe headache.  Feel dizzy or you faint. Summary  Palpitations are feelings that your heartbeat is irregular  or is faster than normal. It may feel like your heart is fluttering or skipping a beat.  Palpitations may be caused by many things, including smoking, caffeine, alcohol, stress, certain medicines, and drugs.  Although most causes of palpitations are not serious, some causes can be a sign of a serious medical problem.  Get help right away if you faint or have chest pain, shortness of breath, a severe headache, or dizziness. This information is not intended to replace advice given to you by your health care provider. Make sure you discuss any questions you have with your health care provider. Document Released: 02/04/2000 Document Revised: 03/21/2017 Document Reviewed: 03/21/2017 Elsevier Patient Education  2020 [employer]

## 2018-09-10 LAB — URINALYSIS, ROUTINE W REFLEX MICROSCOPIC
Bilirubin Urine: NEGATIVE
Glucose, UA: NEGATIVE
Hgb urine dipstick: NEGATIVE
Ketones, ur: NEGATIVE
Leukocytes,Ua: NEGATIVE
Nitrite: NEGATIVE
Protein, ur: NEGATIVE
Specific Gravity, Urine: 1.006 (ref 1.001–1.03)
pH: 7 (ref 5.0–8.0)

## 2018-09-10 LAB — CBC WITH DIFFERENTIAL/PLATELET
Absolute Monocytes: 275 cells/uL (ref 200–950)
Basophils Absolute: 10 cells/uL (ref 0–200)
Basophils Relative: 0.3 %
Eosinophils Absolute: 31 cells/uL (ref 15–500)
Eosinophils Relative: 0.9 %
HCT: 46.7 % (ref 38.5–50.0)
Hemoglobin: 15.9 g/dL (ref 13.2–17.1)
Lymphs Abs: 989 cells/uL (ref 850–3900)
MCH: 33.7 pg — ABNORMAL HIGH (ref 27.0–33.0)
MCHC: 34 g/dL (ref 32.0–36.0)
MCV: 98.9 fL (ref 80.0–100.0)
MPV: 10.3 fL (ref 7.5–12.5)
Monocytes Relative: 8.1 %
Neutro Abs: 2094 cells/uL (ref 1500–7800)
Neutrophils Relative %: 61.6 %
Platelets: 195 10*3/uL (ref 140–400)
RBC: 4.72 10*6/uL (ref 4.20–5.80)
RDW: 12.4 % (ref 11.0–15.0)
Total Lymphocyte: 29.1 %
WBC: 3.4 10*3/uL — ABNORMAL LOW (ref 3.8–10.8)

## 2018-09-10 LAB — HEMOGLOBIN A1C
Hgb A1c MFr Bld: 4.9 % of total Hgb (ref <5.7)
Mean Plasma Glucose: 94 (calc)
eAG (mmol/L): 5.2 (calc)

## 2018-09-10 LAB — COMPLETE METABOLIC PANEL WITH GFR
AG Ratio: 1.6 (calc) (ref 1.0–2.5)
ALT: 60 U/L — ABNORMAL HIGH (ref 9–46)
AST: 61 U/L — ABNORMAL HIGH (ref 10–40)
Albumin: 4.4 g/dL (ref 3.6–5.1)
Alkaline phosphatase (APISO): 43 U/L (ref 36–130)
BUN: 14 mg/dL (ref 7–25)
CO2: 27 mmol/L (ref 20–32)
Calcium: 9.4 mg/dL (ref 8.6–10.3)
Chloride: 100 mmol/L (ref 98–110)
Creat: 1.02 mg/dL (ref 0.60–1.35)
GFR, Est African American: 105 mL/min/{1.73_m2} (ref >=60)
GFR, Est Non African American: 90 mL/min/{1.73_m2} (ref >=60)
Globulin: 2.8 g/dL (calc) (ref 1.9–3.7)
Glucose, Bld: 86 mg/dL (ref 65–99)
Potassium: 4.2 mmol/L (ref 3.5–5.3)
Sodium: 134 mmol/L — ABNORMAL LOW (ref 135–146)
Total Bilirubin: 0.9 mg/dL (ref 0.2–1.2)
Total Protein: 7.2 g/dL (ref 6.1–8.1)

## 2018-09-10 LAB — LIPID PANEL
Cholesterol: 216 mg/dL — ABNORMAL HIGH (ref <200)
HDL: 51 mg/dL (ref >=40)
LDL Cholesterol (Calc): 124 mg/dL (calc) — ABNORMAL HIGH
Non-HDL Cholesterol (Calc): 165 mg/dL (calc) — ABNORMAL HIGH (ref <130)
Total CHOL/HDL Ratio: 4.2 (calc) (ref <5.0)
Triglycerides: 264 mg/dL — ABNORMAL HIGH (ref <150)

## 2018-09-10 LAB — TSH: TSH: 1.37 mIU/L (ref 0.40–4.50)

## 2018-09-10 LAB — MICROALBUMIN / CREATININE URINE RATIO
Creatinine, Urine: 44 mg/dL (ref 20–320)
Microalb Creat Ratio: 9 mcg/mg creat (ref <30)
Microalb, Ur: 0.4 mg/dL

## 2018-09-10 LAB — MAGNESIUM: Magnesium: 2.1 mg/dL (ref 1.5–2.5)

## 2018-09-10 LAB — VITAMIN D 25 HYDROXY (VIT D DEFICIENCY, FRACTURES): Vit D, 25-Hydroxy: 51 ng/mL (ref 30–100)

## 2018-10-02 NOTE — Progress Notes (Signed)
Assessment and Plan:   Essential hypertension Continue enalapril 20mg  BID - continue medications, DASH diet, exercise and monitor at home. Call if greater than 130/80.   Depression with anxiety Monitor  Overweight (BMI 25.0-29.9) - long discussion about weight loss, diet, and exercise -recommended diet heavy in fruits and veggies and low in animal meats, cheeses, and dairy products  Hyperlipidemia -     Lipid panel Has increase to 4 days a week of the crestor  Elevated LFTs -     COMPLETE METABOLIC PANEL WITH GFR - has stopped drinking and lost weight will recehck   Future Appointments  Date Time Provider [place]  03/11/2019  2:30 PM Ward Givens, NP [facility] None  09/15/2019  2:00 PM Vicie Mutters, PA-C [facility] None      HPI 43 y.o.male presents for 1 MONTH FOLLOW UP.   Last visit, patient had increased drinking, had an episode of palpitations with anxiety x 10 mins, given xanax and told to monitor, he states that he is doing well. No more palpitations.  He has some OCD tendencies, will stress out if things are not the way he wants it.  He was given naltrexone but states he never took it, he has not drank for 1 month,  His enalapril was increased last visit due to elevated BP. He has been checking his BP at home and states it has been 125/77, 138/80.  At work he has worked 1st shift, 2nd shift and 3rd shift, so now doing swing shifts at work due to reduced staff.  He is on his CPAP and states he is feeling better.   BP Readings from Last 3 Encounters:  10/07/18 (!) 142/90  09/09/18 (!) 158/102  04/16/18 124/84   BMI is Body mass index is 28.34 kg/m., he is working on diet and exercise. Wt Readings from Last 3 Encounters:  10/07/18 232 lb 12.8 oz (105.6 kg)  09/09/18 239 lb (108.4 kg)  04/16/18 235 lb (106.6 kg)     Past Medical History:  Diagnosis Date  . Anxiety   . Depression   . GERD (gastroesophageal reflux disease)   . Hyperlipidemia    . Hypertension   . Hypogonadism male   . Obesity   . OSA on CPAP 2019  . Vitamin D deficiency      Allergies  Allergen Reactions  . Citalopram Other (See Comments)    anxiety  . Pravastatin   . Wellbutrin [Bupropion]     Current Outpatient Medications on File Prior to Visit  Medication Sig  . ALPRAZolam (XANAX) 1 MG tablet TAKE 1/2 TO 1 TABLET BY MOUTH 3 TIMES A DAY AS NEEDED TRY TO CUT BACK ON THE AMOUNT, SHOULD LAST LONGER THAN A MONTH  . bisoprolol-hydrochlorothiazide (ZIAC) 10-6.25 MG tablet TAKE 1 TABLET DAILY FOR    BLOOD PRESSURE  . cholecalciferol (VITAMIN D) 1000 UNITS tablet Take 1,000 Units by mouth daily. Take 5000 IU daily.  . enalapril (VASOTEC) 20 MG tablet Take 2 tablets (40 mg total) by mouth daily.  Marland Kitchen EPIDUO 0.1-2.5 % gel   . Magnesium 400 MG CAPS Take 1 capsule by mouth daily.  . naltrexone (DEPADE) 50 MG tablet Take 1 tablet (50 mg total) by mouth daily.  . Omega-3 Fatty Acids (FISH OIL) 1000 MG CAPS Take 2,000 mg by mouth daily.  Marland Kitchen omeprazole (PRILOSEC) 40 MG capsule TAKE 1 CAPSULE DAILY FOR   ACID REFLUX  . rosuvastatin (CRESTOR) 20 MG tablet Take 1 tab in the evening  3 days a week for cholesterol.  . testosterone cypionate (DEPOTESTOSTERONE CYPIONATE) 200 MG/ML injection Inject 2 mLs (400 mg total) into the muscle every 14 (fourteen) days.  . TRINTELLIX 20 MG TABS tablet TAKE 1 TABLET DAILY   No current facility-administered medications on file prior to visit.     ROS: all negative except above.   Physical Exam: Filed Weights   10/07/18 1540  Weight: 232 lb 12.8 oz (105.6 kg)   BP (!) 142/90   Pulse 81   Temp (!) 97.3 F (36.3 C)   Ht 6\' 4"  (1.93 m)   Wt 232 lb 12.8 oz (105.6 kg)   SpO2 99%   BMI 28.34 kg/m  General Appearance: Well nourished, in no apparent distress. Eyes: PERRLA, EOMs, conjunctiva no swelling or erythema Sinuses: No Frontal/maxillary tenderness ENT/Mouth: Ext aud canals clear, TMs without erythema, bulging. No erythema,  swelling, or exudate on post pharynx.  Tonsils not swollen or erythematous. Hearing normal.  Neck: Supple, thyroid normal.  Respiratory: Respiratory effort normal, BS equal bilaterally without rales, rhonchi, wheezing or stridor.  Cardio: RRR with no MRGs. Brisk peripheral pulses without edema.  Abdomen: Soft, + BS.  Non tender, no guarding, rebound, hernias, masses. Lymphatics: Non tender without lymphadenopathy.  Musculoskeletal: Full ROM, 5/5 strength, normal gait.  Skin: Warm, dry without rashes, lesions, ecchymosis.  Neuro: Cranial nerves intact. Normal muscle tone, no cerebellar symptoms. Sensation intact.  Psych: Awake and oriented X 3, normal affect, Insight and Judgment appropriate.     Quentin MullingAmanda Brogen Duell, PA-C 3:56 PM [facility]

## 2018-10-07 ENCOUNTER — Other Ambulatory Visit: Payer: Self-pay

## 2018-10-07 ENCOUNTER — Ambulatory Visit (INDEPENDENT_AMBULATORY_CARE_PROVIDER_SITE_OTHER): Payer: 59 | Admitting: Physician Assistant

## 2018-10-07 ENCOUNTER — Encounter: Payer: Self-pay | Admitting: Physician Assistant

## 2018-10-07 VITALS — BP 142/90 | HR 81 | Temp 97.3°F | Ht 76.0 in | Wt 232.8 lb

## 2018-10-07 DIAGNOSIS — F418 Other specified anxiety disorders: Secondary | ICD-10-CM

## 2018-10-07 DIAGNOSIS — E663 Overweight: Secondary | ICD-10-CM | POA: Diagnosis not present

## 2018-10-07 DIAGNOSIS — I10 Essential (primary) hypertension: Secondary | ICD-10-CM | POA: Diagnosis not present

## 2018-10-07 DIAGNOSIS — R945 Abnormal results of liver function studies: Secondary | ICD-10-CM

## 2018-10-07 DIAGNOSIS — E782 Mixed hyperlipidemia: Secondary | ICD-10-CM

## 2018-10-07 DIAGNOSIS — R7989 Other specified abnormal findings of blood chemistry: Secondary | ICD-10-CM

## 2018-10-07 NOTE — Patient Instructions (Addendum)
HYPERTENSION INFORMATION  Monitor your blood pressure at home, please keep a record and bring that in with you to your next office visit.   Go to the ER if any CP, SOB, nausea, dizziness, severe HA, changes vision/speech  Testing/Procedures: HOW TO TAKE YOUR BLOOD PRESSURE:  Rest 5 minutes before taking your blood pressure.  Don't smoke or drink caffeinated beverages for at least 30 minutes before.  Take your blood pressure before (not after) you eat.  Sit comfortably with your back supported and both feet on the floor (don't cross your legs).  Elevate your arm to heart level on a table or a desk.  Use the proper sized cuff. It should fit smoothly and snugly around your bare upper arm. There should be enough room to slip a fingertip under the cuff. The bottom edge of the cuff should be 1 inch above the crease of the elbow.  Due to a recent study, SPRINT, we have changed our goal for the systolic or top blood pressure number. Ideally we want your top number at 120.  In the [facility] Trial, 5000 people were randomized to a goal BP of 120 and 5000 people were randomized to a goal BP of less than 140. The patients with the goal BP at 120 had LESS DEMENTIA, LESS HEART ATTACKS, AND LESS STROKES, AS WELL AS OVERALL DECREASED MORTALITY OR DEATH RATE.   There was another study that showed taking your blood pressure medications at night decrease cardiovascular events.  However if you are on a fluid pill, please take this in the morning.   If you are willing, our goal BP is the top number of 120.  Your most recent BP: BP: (!) 142/90   Take your medications faithfully as instructed. Maintain a healthy weight. Get at least 150 minutes of aerobic exercise per week. Minimize salt intake. Minimize alcohol intake  DASH Eating Plan DASH stands for "Dietary Approaches to Stop Hypertension." The DASH eating plan is a healthy eating plan that has been shown to reduce high blood pressure (hypertension).  Additional health benefits may include reducing the risk of type 2 diabetes mellitus, heart disease, and stroke. The DASH eating plan may also help with weight loss. WHAT DO I NEED TO KNOW ABOUT THE DASH EATING PLAN? For the DASH eating plan, you will follow these general guidelines:  Choose foods with a percent daily value for sodium of less than 5% (as listed on the food label).  Use salt-free seasonings or herbs instead of table salt or sea salt.  Check with your health care provider or pharmacist before using salt substitutes.  Eat lower-sodium products, often labeled as "lower sodium" or "no salt added."  Eat fresh foods.  Eat more vegetables, fruits, and low-fat dairy products.  Choose whole grains. Look for the word "whole" as the first word in the ingredient list.  Choose fish and skinless chicken or [place] more often than red meat. Limit fish, poultry, and meat to 6 oz (170 g) each day.  Limit sweets, desserts, sugars, and sugary drinks.  Choose heart-healthy fats.  Limit cheese to 1 oz (28 g) per day.  Eat more home-cooked food and less restaurant, buffet, and fast food.  Limit fried foods.  Cook foods using methods other than frying.  Limit canned vegetables. If you do use them, rinse them well to decrease the sodium.  When eating at a restaurant, ask that your food be prepared with less salt, or no salt if possible. WHAT FOODS  CAN I EAT? Seek help from a dietitian for individual calorie needs. Grains Whole grain or whole wheat bread. Brown rice. Whole grain or whole wheat pasta. Quinoa, bulgur, and whole grain cereals. Low-sodium cereals. Corn or whole wheat flour tortillas. Whole grain cornbread. Whole grain crackers. Low-sodium crackers. Vegetables Fresh or frozen vegetables (raw, steamed, roasted, or grilled). Low-sodium or reduced-sodium tomato and vegetable juices. Low-sodium or reduced-sodium tomato sauce and paste. Low-sodium or reduced-sodium canned  vegetables.  Fruits All fresh, canned (in natural juice), or frozen fruits. Meat and Other Protein Products Ground beef (85% or leaner), grass-fed beef, or beef trimmed of fat. Skinless chicken or [place]. Ground chicken or [place]. Pork trimmed of fat. All fish and seafood. Eggs. Dried beans, peas, or lentils. Unsalted nuts and seeds. Unsalted canned beans. Dairy Low-fat dairy products, such as skim or 1% milk, 2% or reduced-fat cheeses, low-fat ricotta or cottage cheese, or plain low-fat yogurt. Low-sodium or reduced-sodium cheeses. Fats and Oils Tub margarines without trans fats. Light or reduced-fat mayonnaise and salad dressings (reduced sodium). Avocado. Safflower, olive, or canola oils. Natural peanut or almond butter. Other Unsalted popcorn and pretzels. The items listed above may not be a complete list of recommended foods or beverages. Contact your dietitian for more options. WHAT FOODS ARE NOT RECOMMENDED? Grains White bread. White pasta. White rice. Refined cornbread. Bagels and croissants. Crackers that contain trans fat. Vegetables Creamed or fried vegetables. Vegetables in a cheese sauce. Regular canned vegetables. Regular canned tomato sauce and paste. Regular tomato and vegetable juices. Fruits Dried fruits. Canned fruit in light or heavy syrup. Fruit juice. Meat and Other Protein Products Fatty cuts of meat. Ribs, chicken wings, bacon, sausage, bologna, salami, chitterlings, fatback, hot dogs, bratwurst, and packaged luncheon meats. Salted nuts and seeds. Canned beans with salt. Dairy Whole or 2% milk, cream, half-and-half, and cream cheese. Whole-fat or sweetened yogurt. Full-fat cheeses or blue cheese. Nondairy creamers and whipped toppings. Processed cheese, cheese spreads, or cheese curds. Condiments Onion and garlic salt, seasoned salt, table salt, and sea salt. Canned and packaged gravies. Worcestershire sauce. Tartar sauce. Barbecue sauce. Teriyaki sauce. Soy sauce,  including reduced sodium. Steak sauce. Fish sauce. Oyster sauce. Cocktail sauce. Horseradish. Ketchup and mustard. Meat flavorings and tenderizers. Bouillon cubes. Hot sauce. Tabasco sauce. Marinades. Taco seasonings. Relishes. Fats and Oils Butter, stick margarine, lard, shortening, ghee, and bacon fat. Coconut, palm kernel, or palm oils. Regular salad dressings. Other Pickles and olives. Salted popcorn and pretzels. The items listed above may not be a complete list of foods and beverages to avoid. Contact your dietitian for more information. WHERE CAN I FIND MORE INFORMATION? National Heart, Lung, and Blood Institute: travelstabloid.com Document Released: 01/26/2011 Document Revised: 06/23/2013 Document Reviewed: 12/11/2012 [facility] Patient Information 2015 [place], [place]. This information is not intended to replace advice given to you by your health care provider. Make sure you discuss any questions you have with your health care provider.      When it comes to diets, agreement about the perfect plan isn't easy to find, even among the experts. Experts at the [employer] developed an idea known as the Healthy Eating Plate. Just imagine a plate divided into logical, healthy portions.  The emphasis is on diet quality:  Load up on vegetables and fruits - one-half of your plate: Aim for color and variety, and remember that potatoes don't count.  Go for whole grains - one-quarter of your plate: Whole wheat, barley, wheat berries, quinoa, oats, brown  rice, and foods made with them. If you want pasta, go with whole wheat pasta.  Protein power - one-quarter of your plate: Fish, chicken, beans, and nuts are all healthy, versatile protein sources. Limit red meat.  The diet, however, does go beyond the plate, offering a few other suggestions.  Use healthy plant oils, such as olive, canola, soy, corn, sunflower and peanut. Check the labels, and  avoid partially hydrogenated oil, which have unhealthy trans fats.  If you're thirsty, drink water. Coffee and tea are good in moderation, but skip sugary drinks and limit milk and dairy products to one or two daily servings.  The type of carbohydrate in the diet is more important than the amount. Some sources of carbohydrates, such as vegetables, fruits, whole grains, and beans-are healthier than others.  Finally, stay active.

## 2018-10-08 LAB — COMPLETE METABOLIC PANEL WITH GFR
AG Ratio: 2 (calc) (ref 1.0–2.5)
ALT: 53 U/L — ABNORMAL HIGH (ref 9–46)
AST: 39 U/L (ref 10–40)
Albumin: 4.8 g/dL (ref 3.6–5.1)
Alkaline phosphatase (APISO): 43 U/L (ref 36–130)
BUN: 10 mg/dL (ref 7–25)
CO2: 29 mmol/L (ref 20–32)
Calcium: 10.4 mg/dL — ABNORMAL HIGH (ref 8.6–10.3)
Chloride: 101 mmol/L (ref 98–110)
Creat: 1.21 mg/dL (ref 0.60–1.35)
GFR, Est African American: 85 mL/min/{1.73_m2} (ref >=60)
GFR, Est Non African American: 73 mL/min/{1.73_m2} (ref >=60)
Globulin: 2.4 g/dL (calc) (ref 1.9–3.7)
Glucose, Bld: 89 mg/dL (ref 65–99)
Potassium: 3.9 mmol/L (ref 3.5–5.3)
Sodium: 140 mmol/L (ref 135–146)
Total Bilirubin: 0.9 mg/dL (ref 0.2–1.2)
Total Protein: 7.2 g/dL (ref 6.1–8.1)

## 2018-10-08 LAB — LIPID PANEL
Cholesterol: 186 mg/dL (ref <200)
HDL: 37 mg/dL — ABNORMAL LOW (ref >=40)
LDL Cholesterol (Calc): 123 mg/dL (calc) — ABNORMAL HIGH
Non-HDL Cholesterol (Calc): 149 mg/dL (calc) — ABNORMAL HIGH (ref <130)
Total CHOL/HDL Ratio: 5 (calc) — ABNORMAL HIGH (ref <5.0)
Triglycerides: 149 mg/dL (ref <150)

## 2018-11-14 ENCOUNTER — Other Ambulatory Visit: Payer: Self-pay | Admitting: Internal Medicine

## 2018-11-18 ENCOUNTER — Other Ambulatory Visit: Payer: Self-pay | Admitting: Physician Assistant

## 2018-11-18 DIAGNOSIS — F418 Other specified anxiety disorders: Secondary | ICD-10-CM

## 2018-11-18 DIAGNOSIS — I10 Essential (primary) hypertension: Secondary | ICD-10-CM

## 2019-02-05 NOTE — Progress Notes (Deleted)
Assessment and Plan:   Essential hypertension Continue enalapril 20mg  BID - continue medications, DASH diet, exercise and monitor at home. Call if greater than 130/80.   Depression with anxiety Monitor  Overweight (BMI 25.0-29.9) - long discussion about weight loss, diet, and exercise -recommended diet heavy in fruits and veggies and low in animal meats, cheeses, and dairy products  Hyperlipidemia -     Lipid panel Has increase to 4 days a week of the crestor  Elevated LFTs -     COMPLETE METABOLIC PANEL WITH GFR - has stopped drinking and lost weight will recehck   Future Appointments  Date Time Provider [place]  02/06/2019  3:30 PM Vicie Mutters, PA-C [facility] None  03/11/2019  2:30 PM Ward Givens, NP [facility] None  09/15/2019  2:00 PM Vicie Mutters, PA-C [facility] None      HPI 43 y.o.male presents for 1 MONTH FOLLOW UP.   Last visit, patient had increased drinking, had an episode of palpitations with anxiety x 10 mins, given xanax and told to monitor, he states that he is doing well. No more palpitations.  He has some OCD tendencies, will stress out if things are not the way he wants it.  He was given naltrexone but states he never took it, he has not drank for 1 month,  His enalapril was increased last visit due to elevated BP. He has been checking his BP at home and states it has been 125/77, 138/80.  At work he has worked 1st shift, 2nd shift and 3rd shift, so now doing swing shifts at work due to reduced staff.  He is on his CPAP and states he is feeling better.   BP Readings from Last 3 Encounters:  10/07/18 (!) 142/90  09/09/18 (!) 158/102  04/16/18 124/84   BMI is There is no height or weight on file to calculate BMI., he is working on diet and exercise. Wt Readings from Last 3 Encounters:  10/07/18 232 lb 12.8 oz (105.6 kg)  09/09/18 239 lb (108.4 kg)  04/16/18 235 lb (106.6 kg)     Past Medical History:  Diagnosis Date  .  Anxiety   . Depression   . GERD (gastroesophageal reflux disease)   . Hyperlipidemia   . Hypertension   . Hypogonadism male   . Obesity   . OSA on CPAP 2019  . Vitamin D deficiency      Allergies  Allergen Reactions  . Citalopram Other (See Comments)    anxiety  . Pravastatin   . Wellbutrin [Bupropion]     Current Outpatient Medications on File Prior to Visit  Medication Sig  . ALPRAZolam (XANAX) 1 MG tablet TAKE 1/2 TO 1 TABLET 3 TIMES A DAY AS NEEDED TRY TO CUT BACK ON THE AMOUNT, SHOULD LAST LONGER THAN A MONTH  . bisoprolol-hydrochlorothiazide (ZIAC) 10-6.25 MG tablet TAKE 1 TABLET DAILY FOR    BLOOD PRESSURE  . cholecalciferol (VITAMIN D) 1000 UNITS tablet Take 1,000 Units by mouth daily. Take 5000 IU daily.  . enalapril (VASOTEC) 20 MG tablet TAKE 2 TABLETS(40MG  TOTAL) DAILY  . EPIDUO 0.1-2.5 % gel   . Magnesium 400 MG CAPS Take 1 capsule by mouth daily.  . naltrexone (DEPADE) 50 MG tablet Take 1 tablet (50 mg total) by mouth daily.  . Omega-3 Fatty Acids (FISH OIL) 1000 MG CAPS Take 2,000 mg by mouth daily.  Marland Kitchen omeprazole (PRILOSEC) 40 MG capsule TAKE 1 CAPSULE DAILY FOR   ACID REFLUX  . rosuvastatin (CRESTOR)  20 MG tablet Take 1 tab in the evening 3 days a week for cholesterol.  . testosterone cypionate (DEPOTESTOSTERONE CYPIONATE) 200 MG/ML injection Inject 2 mLs (400 mg total) into the muscle every 14 (fourteen) days.  . TRINTELLIX 20 MG TABS tablet TAKE 1 TABLET DAILY   No current facility-administered medications on file prior to visit.    ROS: all negative except above.   Physical Exam: There were no vitals filed for this visit. There were no vitals taken for this visit. General Appearance: Well nourished, in no apparent distress. Eyes: PERRLA, EOMs, conjunctiva no swelling or erythema Sinuses: No Frontal/maxillary tenderness ENT/Mouth: Ext aud canals clear, TMs without erythema, bulging. No erythema, swelling, or exudate on post pharynx.  Tonsils not swollen  or erythematous. Hearing normal.  Neck: Supple, thyroid normal.  Respiratory: Respiratory effort normal, BS equal bilaterally without rales, rhonchi, wheezing or stridor.  Cardio: RRR with no MRGs. Brisk peripheral pulses without edema.  Abdomen: Soft, + BS.  Non tender, no guarding, rebound, hernias, masses. Lymphatics: Non tender without lymphadenopathy.  Musculoskeletal: Full ROM, 5/5 strength, normal gait.  Skin: Warm, dry without rashes, lesions, ecchymosis.  Neuro: Cranial nerves intact. Normal muscle tone, no cerebellar symptoms. Sensation intact.  Psych: Awake and oriented X 3, normal affect, Insight and Judgment appropriate.     Quentin Mulling, PA-C 11:58 AM [facility]

## 2019-02-06 ENCOUNTER — Ambulatory Visit: Payer: 59 | Admitting: Physician Assistant

## 2019-02-11 ENCOUNTER — Other Ambulatory Visit: Payer: Self-pay | Admitting: Physician Assistant

## 2019-02-11 DIAGNOSIS — F418 Other specified anxiety disorders: Secondary | ICD-10-CM

## 2019-02-11 DIAGNOSIS — E291 Testicular hypofunction: Secondary | ICD-10-CM

## 2019-02-11 MED ORDER — VORTIOXETINE HBR 20 MG PO TABS: 20.0000 mg | ORAL_TABLET | Freq: Every day | ORAL | 0 refills | Status: DC

## 2019-03-01 ENCOUNTER — Other Ambulatory Visit: Payer: Self-pay | Admitting: Adult Health

## 2019-03-01 ENCOUNTER — Other Ambulatory Visit: Payer: Self-pay | Admitting: Physician Assistant

## 2019-03-01 DIAGNOSIS — I10 Essential (primary) hypertension: Secondary | ICD-10-CM

## 2019-03-03 ENCOUNTER — Ambulatory Visit: Payer: 59 | Admitting: Physician Assistant

## 2019-03-10 ENCOUNTER — Encounter: Payer: Self-pay | Admitting: Adult Health

## 2019-03-11 ENCOUNTER — Telehealth (INDEPENDENT_AMBULATORY_CARE_PROVIDER_SITE_OTHER): Payer: Managed Care, Other (non HMO) | Admitting: Adult Health

## 2019-03-11 DIAGNOSIS — Z9989 Dependence on other enabling machines and devices: Secondary | ICD-10-CM

## 2019-03-11 DIAGNOSIS — G4733 Obstructive sleep apnea (adult) (pediatric): Secondary | ICD-10-CM

## 2019-03-11 NOTE — Progress Notes (Signed)
PATIENT: Ian Zavala DOB: 08-28-1975  REASON FOR VISIT: follow up HISTORY FROM: patient  Virtual Visit via Video Note  I connected with Ian Zavala on 03/11/19 at  2:30 PM EST by a video enabled telemedicine application located remotely at [facility] and verified that I am speaking with the correct person using two identifiers who was located at their own home.   I discussed the limitations of evaluation and management by telemedicine and the availability of in person appointments. The patient expressed understanding and agreed to proceed.   PATIENT: Ian Zavala DOB: 06/08/1975  REASON FOR VISIT: follow up HISTORY FROM: patient  HISTORY OF PRESENT ILLNESS: Today 03/11/19:  [name] is a 44 year old male with a history of obstructive sleep apnea on CPAP.  He returns today for follow-up.  His CPAP download indicates that he uses machine nightly for compliance of 100%.  He uses machine greater than 4 hours 29 days for compliance of 97%.  On average he uses his machine 7 hours and 41 minutes.  His residual AHI is 3.6 on 5 to 15 cm of water with EPR of 3.  His leak in the 95th percentile is 43.7 L/min.  He reports that his mask fits well states that his straps typically wear out very quickly.  Unfortunately his insurance will not replace the stress but every 3 months.  He is not interested in a mask refitting.  He returns today for an evaluation.  HISTORY 08/27/18:  Mr. Stumph is a 44 year old male with a history of obstructive sleep apnea on CPAP.  He returns today for follow-up.  His CPAP download indicates that he uses machine nightly for compliance of 100%.  He uses machine greater than 4 hours 28 days for compliance of 93%.  On average he uses his machine 7 hours and 45 minutes.  His residual AHI is 5.3 on 5 to 15 cm of water with EPR of 3.  His leak in the 95th percentile is 58.5 L/min.  He states that the straps to his mask are worn  out.  Reports that he has order new supplies and is currently waiting on it.  He joins me today for virtual visit  REVIEW OF SYSTEMS: Out of a complete 14 system review of symptoms, the patient complains only of the following symptoms, and all other reviewed systems are negative.  See HPI  ALLERGIES: Allergies  Allergen Reactions  . Citalopram Other (See Comments)    anxiety  . Pravastatin   . Wellbutrin [Bupropion]     HOME MEDICATIONS: Outpatient Medications Prior to Visit  Medication Sig Dispense Refill  . ALPRAZolam (XANAX) 1 MG tablet Take 1/2-1 tablet 2 - 3 x /day ONLY if needed for Anxiety Attack &  limit to 5 days /week to avoid addiction 90 tablet 0  . bisoprolol-hydrochlorothiazide (ZIAC) 10-6.25 MG tablet TAKE 1 TABLET DAILY FOR    BLOOD PRESSURE 90 tablet 3  . cholecalciferol (VITAMIN D) 1000 UNITS tablet Take 1,000 Units by mouth daily. Take 5000 IU daily.    . enalapril (VASOTEC) 20 MG tablet Take 2 tablets (40 mg) Daily for BP 180 tablet 0  . EPIDUO 0.1-2.5 % gel     . Magnesium 400 MG CAPS Take 1 capsule by mouth daily.    . naltrexone (DEPADE) 50 MG tablet Take 1 tablet (50 mg total) by mouth daily. 90 tablet 0  . Omega-3 Fatty Acids (FISH OIL) 1000 MG CAPS Take 2,000  mg by mouth daily.    Marland Kitchen omeprazole (PRILOSEC) 40 MG capsule Take 1 capsule Daily for Indigestion & Heartburn 90 capsule 1  . rosuvastatin (CRESTOR) 20 MG tablet Take 1 tab in the evening 3 days a week for cholesterol. 90 tablet 1  . testosterone cypionate (DEPOTESTOSTERONE CYPIONATE) 200 MG/ML injection Inject 2 ml into muscle every 14 days 12 mL 2  . vortioxetine HBr (TRINTELLIX) 20 MG TABS tablet Take 1 tablet (20 mg total) by mouth daily. 90 tablet 0   No facility-administered medications prior to visit.    PAST MEDICAL HISTORY: Past Medical History:  Diagnosis Date  . Anxiety   . Depression   . GERD (gastroesophageal reflux disease)   . Hyperlipidemia   . Hypertension   . Hypogonadism male    . Obesity   . OSA on CPAP 2019  . Vitamin D deficiency     PAST SURGICAL HISTORY: No past surgical history on file.  FAMILY HISTORY: Family History  Problem Relation Age of Onset  . Hypertension Mother   . Depression Mother   . Fibromyalgia Mother   . Depression Brother   . Hypertension Brother   . Cancer Father 36       urethral/lung cancer  . Depression Sister     SOCIAL HISTORY: Social History   Socioeconomic History  . Marital status: Married    Spouse name: Not on file  . Number of children: Not on file  . Years of education: Not on file  . Highest education level: Not on file  Occupational History  . Not on file  Tobacco Use  . Smoking status: Never Smoker  . Smokeless tobacco: Never Used  Substance and Sexual Activity  . Alcohol use: Yes    Types: 12 drink(s) per week  . Drug use: No  . Sexual activity: Yes  Other Topics Concern  . Not on file  Social History Narrative  . Not on file   Social Determinants of Health   Financial Resource Strain:   . Difficulty of Paying Living Expenses: Not on file  Food Insecurity:   . Worried About Programme researcher, broadcasting/film/video in the Last Year: Not on file  . Ran Out of Food in the Last Year: Not on file  Transportation Needs:   . Lack of Transportation (Medical): Not on file  . Lack of Transportation (Non-Medical): Not on file  Physical Activity:   . Days of Exercise per Week: Not on file  . Minutes of Exercise per Session: Not on file  Stress:   . Feeling of Stress : Not on file  Social Connections:   . Frequency of Communication with Friends and Family: Not on file  . Frequency of Social Gatherings with Friends and Family: Not on file  . Attends Religious Services: Not on file  . Active Member of Clubs or Organizations: Not on file  . Attends Banker Meetings: Not on file  . Marital Status: Not on file  Intimate Partner Violence:   . Fear of Current or Ex-Partner: Not on file  . Emotionally Abused:  Not on file  . Physically Abused: Not on file  . Sexually Abused: Not on file      PHYSICAL EXAM Generalized: Well developed, in no acute distress   Neurological examination  Mentation: Alert oriented to time, place, history taking. Follows all commands speech and language fluent Cranial nerve II-XII:Extraocular movements were full. Facial symmetry noted. uvula tongue midline. Head turning and shoulder shrug  were normal and symmetric. Motor: Good strength throughout subjectively per patient Sensory: Sensory testing is intact to soft touch on all 4 extremities subjectively per patient Coordination: Cerebellar testing reveals good finger-nose-finger  Gait and station: Patient is able to stand from a seated position. gait is normal.  Reflexes: UTA  DIAGNOSTIC DATA (LABS, IMAGING, TESTING) - I reviewed patient records, labs, notes, testing and imaging myself where available.  Lab Results  Component Value Date   WBC 3.4 (L) 09/09/2018   HGB 15.9 09/09/2018   HCT 46.7 09/09/2018   MCV 98.9 09/09/2018   PLT 195 09/09/2018      Component Value Date/Time   NA 140 10/07/2018 1642   K 3.9 10/07/2018 1642   CL 101 10/07/2018 1642   CO2 29 10/07/2018 1642   GLUCOSE 89 10/07/2018 1642   BUN 10 10/07/2018 1642   CREATININE 1.21 10/07/2018 1642   CALCIUM 10.4 (H) 10/07/2018 1642   PROT 7.2 10/07/2018 1642   ALBUMIN 4.5 06/26/2016 1627   AST 39 10/07/2018 1642   ALT 53 (H) 10/07/2018 1642   ALKPHOS 46 06/26/2016 1627   BILITOT 0.9 10/07/2018 1642   GFRNONAA 73 10/07/2018 1642   GFRAA 85 10/07/2018 1642   Lab Results  Component Value Date   CHOL 186 10/07/2018   HDL 37 (L) 10/07/2018   LDLCALC 123 (H) 10/07/2018   TRIG 149 10/07/2018   CHOLHDL 5.0 (H) 10/07/2018   Lab Results  Component Value Date   HGBA1C 4.9 09/09/2018   Lab Results  Component Value Date   VITAMINB12 638 06/26/2016   Lab Results  Component Value Date   TSH 1.37 09/09/2018      ASSESSMENT AND  PLAN 44 y.o. year old male  has a past medical history of Anxiety, Depression, GERD (gastroesophageal reflux disease), Hyperlipidemia, Hypertension, Hypogonadism male, Obesity, OSA on CPAP (2019), and Vitamin D deficiency. here with :  1. Obstructive sleep apnea on CPAP  The patient's CPAP download shows excellent compliance and good treatment of his apnea.  He is encouraged to continue using CPAP nightly and greater than 4 hours each night.  He is advised that if his symptoms worsen or he develops new symptoms he should let us know.  He will follow-up in 1 year or sooner if needed    I spent 15 minutes with the patient. 50% of this time was spent reviewing CPAP download   Butch Penny, MSN, NP-C 03/11/2019, 2:58 PM [facility] [address] [place], [place] [address] [phone]

## 2019-03-21 NOTE — Progress Notes (Signed)
Assessment and Plan:  Essential hypertension Continue enalapril 20mg  BID - continue medications, DASH diet, exercise and monitor at home. Call if greater than 130/80.   Depression with anxiety Monitor  Overweight (BMI 25.0-29.9) - long discussion about weight loss, diet, and exercise -recommended diet heavy in fruits and veggies and low in animal meats, cheeses, and dairy products  Hyperlipidemia -     Lipid panel Has increase to 4 days a week of the crestor  Elevated LFTs -     COMPLETE METABOLIC PANEL WITH GFR - has stopped drinking and lost weight will recehck   Future Appointments  Date Time Provider [place]  09/15/2019  2:00 PM Vicie Mutters, PA-C [facility] None  03/10/2020  3:00 PM Ward Givens, NP [facility] None      HPI 44 y.o.male presents for 3 month follow up.   He is back to nights 5 nights a week, is not drink during the week will very rarely have drink during the week.  His enalapril 40 mg before bed and ziac in the morning.  He has been checking his BP at home has been good.  He is on his CPAP and states he is feeling better.  BP Readings from Last 3 Encounters:  03/24/19 120/84  10/07/18 (!) 142/90  09/09/18 (!) 158/102   BMI is Body mass index is 28.24 kg/m., he is working on diet and exercise.  He denies chest pain, shortness of breath, dizziness. Wt Readings from Last 3 Encounters:  03/24/19 232 lb (105.2 kg)  10/07/18 232 lb 12.8 oz (105.6 kg)  09/09/18 239 lb (108.4 kg)    He is on cholesterol medication, crestor 20mg  4 days a week and denies myalgias. His cholesterol is at goal. The cholesterol last visit was:   Lab Results  Component Value Date   CHOL 186 10/07/2018   HDL 37 (L) 10/07/2018   LDLCALC 123 (H) 10/07/2018   TRIG 149 10/07/2018   CHOLHDL 5.0 (H) 10/07/2018     Last A1C in the office was:  Lab Results  Component Value Date   HGBA1C 4.9 09/09/2018   Patient is on Vitamin D supplement.   Lab Results   Component Value Date   VD25OH 51 09/09/2018      Past Medical History:  Diagnosis Date  . Anxiety   . Depression   . GERD (gastroesophageal reflux disease)   . Hyperlipidemia   . Hypertension   . Hypogonadism male   . Obesity   . OSA on CPAP 2019  . Vitamin D deficiency      Allergies  Allergen Reactions  . Citalopram Other (See Comments)    anxiety  . Pravastatin   . Wellbutrin [Bupropion]     Current Outpatient Medications on File Prior to Visit  Medication Sig  . ALPRAZolam (XANAX) 1 MG tablet Take 1/2-1 tablet 2 - 3 x /day ONLY if needed for Anxiety Attack &  limit to 5 days /week to avoid addiction  . bisoprolol-hydrochlorothiazide (ZIAC) 10-6.25 MG tablet TAKE 1 TABLET DAILY FOR    BLOOD PRESSURE  . cholecalciferol (VITAMIN D) 1000 UNITS tablet Take 1,000 Units by mouth daily. Take 5000 IU daily.  . enalapril (VASOTEC) 20 MG tablet Take 2 tablets (40 mg) Daily for BP  . EPIDUO 0.1-2.5 % gel   . Magnesium 400 MG CAPS Take 1 capsule by mouth daily.  . naltrexone (DEPADE) 50 MG tablet Take 1 tablet (50 mg total) by mouth daily.  . Omega-3 Fatty Acids (  FISH OIL) 1000 MG CAPS Take 2,000 mg by mouth daily.  Marland Kitchen omeprazole (PRILOSEC) 40 MG capsule Take 1 capsule Daily for Indigestion & Heartburn  . rosuvastatin (CRESTOR) 20 MG tablet Take 1 tab in the evening 3 days a week for cholesterol.  . testosterone cypionate (DEPOTESTOSTERONE CYPIONATE) 200 MG/ML injection Inject 2 ml into muscle every 14 days  . vortioxetine HBr (TRINTELLIX) 20 MG TABS tablet Take 1 tablet (20 mg total) by mouth daily.   No current facility-administered medications on file prior to visit.    ROS: all negative except above.   Physical Exam: Filed Weights   03/24/19 1538  Weight: 232 lb (105.2 kg)   BP 120/84   Pulse 72   Temp 97.7 F (36.5 C)   Wt 232 lb (105.2 kg)   SpO2 98%   BMI 28.24 kg/m  General Appearance: Well nourished, in no apparent distress. Eyes: PERRLA, EOMs, conjunctiva  no swelling or erythema Sinuses: No Frontal/maxillary tenderness ENT/Mouth: Ext aud canals clear, TMs without erythema, bulging. No erythema, swelling, or exudate on post pharynx.  Tonsils not swollen or erythematous. Hearing normal.  Neck: Supple, thyroid normal.  Respiratory: Respiratory effort normal, BS equal bilaterally without rales, rhonchi, wheezing or stridor.  Cardio: RRR with no MRGs. Brisk peripheral pulses without edema.  Abdomen: Soft, + BS.  Non tender, no guarding, rebound, hernias, masses. Lymphatics: Non tender without lymphadenopathy.  Musculoskeletal: Full ROM, 5/5 strength, normal gait.  Skin: Warm, dry without rashes, lesions, ecchymosis.  Neuro: Cranial nerves intact. Normal muscle tone, no cerebellar symptoms. Sensation intact.  Psych: Awake and oriented X 3, normal affect, Insight and Judgment appropriate.     Quentin Mulling, PA-C 3:45 PM [facility]

## 2019-03-24 ENCOUNTER — Encounter: Payer: Self-pay | Admitting: Physician Assistant

## 2019-03-24 ENCOUNTER — Other Ambulatory Visit: Payer: Self-pay

## 2019-03-24 ENCOUNTER — Ambulatory Visit: Payer: Managed Care, Other (non HMO) | Admitting: Physician Assistant

## 2019-03-24 VITALS — BP 120/84 | HR 72 | Temp 97.7°F | Wt 232.0 lb

## 2019-03-24 DIAGNOSIS — N529 Male erectile dysfunction, unspecified: Secondary | ICD-10-CM | POA: Diagnosis not present

## 2019-03-24 DIAGNOSIS — E782 Mixed hyperlipidemia: Secondary | ICD-10-CM | POA: Diagnosis not present

## 2019-03-24 DIAGNOSIS — Z79899 Other long term (current) drug therapy: Secondary | ICD-10-CM | POA: Diagnosis not present

## 2019-03-24 DIAGNOSIS — R7309 Other abnormal glucose: Secondary | ICD-10-CM

## 2019-03-24 DIAGNOSIS — I10 Essential (primary) hypertension: Secondary | ICD-10-CM

## 2019-03-24 DIAGNOSIS — E559 Vitamin D deficiency, unspecified: Secondary | ICD-10-CM

## 2019-03-24 DIAGNOSIS — F418 Other specified anxiety disorders: Secondary | ICD-10-CM

## 2019-03-24 MED ORDER — TRAZODONE HCL 50 MG PO TABS: ORAL_TABLET | ORAL | 0 refills | Status: DC

## 2019-03-24 NOTE — Patient Instructions (Addendum)
NEW GUIDELINES FOR BENOZOS  New guidelines suggest the benzodiazepines are best short term, with prolonged use they lead to physical and psychological dependence. In addition, evidence suggest that for insomnia the effectiveness wanes in 4 weeks and the risks out weight their benefits. Use of these agents have been associated with dementia, falls, motor vehicle accidents and physical addiction. Decreasing these medication have been proven to show improvements in cognition, alertness, decrease of falls and daytime sedation.   We will start a slow taper, symptoms of withdrawal include, insomnia, anxiety, irritability, sweating and stomach or intestinal symptoms like diarrhea or nausea.   ONLY REFILL IF YOU NEED PLEASE DO NOT KEEP A STOCK PILE  TRAZODONE  Try the trazodone This is not habit forming, you will not build a tolerance to it Please start out at 25mg  or 1/2 pill, can increase to 50mg  at night, and the max of this medication is 150mg  at night.   Side effects can be odd dreams, dry mouth, and drowsiness.   11 Tips to Follow:  1. No caffeine after 3pm: Avoid beverages with caffeine (soda, tea, energy drinks, etc.) especially after 3pm. 2. Don't go to bed hungry: Have your evening meal at least 3 hrs. before going to sleep. It's fine to have a small bedtime snack such as a glass of milk and a few crackers but don't have a big meal. 3. Have a nightly routine before bed: Plan on "winding down" before you go to sleep. Begin relaxing about 1 hour before you go to bed. Try doing a quiet activity such as listening to calming music, reading a book or meditating. 4. Turn off the TV and ALL electronics including video games, tablets, laptops, etc. 1 hour before sleep, and keep them out of the bedroom. 5. Turn off your cell phone and all notifications (new email and text alerts) or even better, leave your phone outside your room while you sleep. Studies have shown that a part of your brain continues  to respond to certain lights and sounds even while you're still asleep. 6. Make your bedroom quiet, dark and cool. If you can't control the noise, try wearing earplugs or using a fan to block out other sounds. 7. Practice relaxation techniques. Try reading a book or meditating or drain your brain by writing a list of what you need to do the next day. 8. Don't nap unless you feel sick: you'll have a better night's sleep. 9. Don't smoke, or quit if you do. Nicotine, alcohol, and marijuana can all keep you awake. Talk to your health care provider if you need help with substance use. 10. Most importantly, wake up at the same time every day (or within 1 hour of your usual wake up time) EVEN on the weekends. A regular wake up time promotes sleep hygiene and prevents sleep problems. 11. Reduce exposure to bright light in the last three hours of the day before going to sleep. Maintaining good sleep hygiene and having good sleep habits lower your risk of developing sleep problems. Getting better sleep can also improve your concentration and alertness. Try the simple steps in this guide. If you still have trouble getting enough rest, make an appointment with your health care provider.

## 2019-03-25 LAB — CBC WITH DIFFERENTIAL/PLATELET
Absolute Monocytes: 289 cells/uL (ref 200–950)
Basophils Absolute: 19 cells/uL (ref 0–200)
Basophils Relative: 0.5 %
Eosinophils Absolute: 38 cells/uL (ref 15–500)
Eosinophils Relative: 1 %
HCT: 46.7 % (ref 38.5–50.0)
Hemoglobin: 16.4 g/dL (ref 13.2–17.1)
Lymphs Abs: 954 cells/uL (ref 850–3900)
MCH: 33.7 pg — ABNORMAL HIGH (ref 27.0–33.0)
MCHC: 35.1 g/dL (ref 32.0–36.0)
MCV: 96.1 fL (ref 80.0–100.0)
MPV: 11.7 fL (ref 7.5–12.5)
Monocytes Relative: 7.6 %
Neutro Abs: 2500 cells/uL (ref 1500–7800)
Neutrophils Relative %: 65.8 %
Platelets: 184 10*3/uL (ref 140–400)
RBC: 4.86 10*6/uL (ref 4.20–5.80)
RDW: 11.7 % (ref 11.0–15.0)
Total Lymphocyte: 25.1 %
WBC: 3.8 10*3/uL (ref 3.8–10.8)

## 2019-03-25 LAB — COMPLETE METABOLIC PANEL WITH GFR
AG Ratio: 1.9 (calc) (ref 1.0–2.5)
ALT: 25 U/L (ref 9–46)
AST: 28 U/L (ref 10–40)
Albumin: 4.8 g/dL (ref 3.6–5.1)
Alkaline phosphatase (APISO): 38 U/L (ref 36–130)
BUN: 12 mg/dL (ref 7–25)
CO2: 28 mmol/L (ref 20–32)
Calcium: 9.7 mg/dL (ref 8.6–10.3)
Chloride: 102 mmol/L (ref 98–110)
Creat: 0.95 mg/dL (ref 0.60–1.35)
GFR, Est African American: 113 mL/min/{1.73_m2} (ref >=60)
GFR, Est Non African American: 98 mL/min/{1.73_m2} (ref >=60)
Globulin: 2.5 g/dL (calc) (ref 1.9–3.7)
Glucose, Bld: 87 mg/dL (ref 65–99)
Potassium: 4.3 mmol/L (ref 3.5–5.3)
Sodium: 138 mmol/L (ref 135–146)
Total Bilirubin: 0.7 mg/dL (ref 0.2–1.2)
Total Protein: 7.3 g/dL (ref 6.1–8.1)

## 2019-03-25 LAB — LIPID PANEL
Cholesterol: 225 mg/dL — ABNORMAL HIGH (ref <200)
HDL: 37 mg/dL — ABNORMAL LOW (ref >=40)
LDL Cholesterol (Calc): 163 mg/dL (calc) — ABNORMAL HIGH
Non-HDL Cholesterol (Calc): 188 mg/dL (calc) — ABNORMAL HIGH (ref <130)
Total CHOL/HDL Ratio: 6.1 (calc) — ABNORMAL HIGH (ref <5.0)
Triglycerides: 128 mg/dL (ref <150)

## 2019-03-25 LAB — MAGNESIUM: Magnesium: 2.4 mg/dL (ref 1.5–2.5)

## 2019-03-25 LAB — HEMOGLOBIN A1C
Hgb A1c MFr Bld: 5.1 % of total Hgb (ref <5.7)
Mean Plasma Glucose: 100 (calc)
eAG (mmol/L): 5.5 (calc)

## 2019-03-25 LAB — TSH: TSH: 1.51 mIU/L (ref 0.40–4.50)

## 2019-03-25 LAB — VITAMIN D 25 HYDROXY (VIT D DEFICIENCY, FRACTURES): Vit D, 25-Hydroxy: 53 ng/mL (ref 30–100)

## 2019-05-14 ENCOUNTER — Ambulatory Visit: Payer: Managed Care, Other (non HMO) | Admitting: Physician Assistant

## 2019-05-14 ENCOUNTER — Encounter: Payer: Self-pay | Admitting: Physician Assistant

## 2019-05-14 ENCOUNTER — Other Ambulatory Visit: Payer: Self-pay

## 2019-05-14 VITALS — BP 130/86 | HR 80 | Temp 97.6°F | Wt 229.0 lb

## 2019-05-14 DIAGNOSIS — Z130 Encounter for screening for diseases of the blood and blood-forming organs and certain disorders involving the immune mechanism: Secondary | ICD-10-CM | POA: Diagnosis not present

## 2019-05-14 DIAGNOSIS — R197 Diarrhea, unspecified: Secondary | ICD-10-CM | POA: Diagnosis not present

## 2019-05-14 DIAGNOSIS — R1013 Epigastric pain: Secondary | ICD-10-CM

## 2019-05-14 DIAGNOSIS — R232 Flushing: Secondary | ICD-10-CM | POA: Diagnosis not present

## 2019-05-14 DIAGNOSIS — F419 Anxiety disorder, unspecified: Secondary | ICD-10-CM

## 2019-05-14 MED ORDER — PROPRANOLOL HCL 10 MG PO TABS: 10.0000 mg | ORAL_TABLET | Freq: Three times a day (TID) | ORAL | 1 refills | Status: DC | PRN

## 2019-05-14 NOTE — Progress Notes (Signed)
Subjective:    Patient ID: Ian Zavala, male    DOB: May 14, 1975, 44 y.o.   MRN: [id number]  HPI 44 y.o. WM with history of HTN, OSA, drinking, chol presents with medication concerns.   He was seen for CPE in July, had elevated BP, depression, increased ETOH use-he was given xanax and naltrexone at that time, has been on trintellix- was doing better but was given trazodone to try to cut back on xanax use.  Last refilled Xanax 1mg  # 90 02/11/19.  Since taking trazodone states he has been having continuous panic attacks, bad anxiety, has not been able to go to work, feels he can not think. He has been having vomiting, diarrhea, went to UC, had negative COVID test. Diarrhea has improved. He has stopped the trazadone 2 days. Since this has been happening he has been taking 2 a day of the xanax.   States mentally he is not in this room, feels disconnected. He has been "self medicating" drinking 6+ beers a night, afraid he will withdrawal if he stops drinking now.   No hallucinations, has had thoughts of self harm but denies any plan of SI/HI.   He is still using his CPAP.   BMI is Body mass index is 27.87 kg/m., he is working on diet and exercise. Has had decreased apptitie and weight loss per wife.  Wt Readings from Last 3 Encounters:  05/14/19 229 lb (103.9 kg)  03/24/19 232 lb (105.2 kg)  10/07/18 232 lb 12.8 oz (105.6 kg)    Blood pressure 130/86, pulse 80, temperature 97.6 F (36.4 C), weight 229 lb (103.9 kg), SpO2 97 %.  Medications  Current Outpatient Medications (Endocrine & Metabolic):  .  testosterone cypionate (DEPOTESTOSTERONE CYPIONATE) 200 MG/ML injection, Inject 2 ml into muscle every 14 days  Current Outpatient Medications (Cardiovascular):  .  bisoprolol-hydrochlorothiazide (ZIAC) 10-6.25 MG tablet, TAKE 1 TABLET DAILY FOR    BLOOD PRESSURE .  enalapril (VASOTEC) 20 MG tablet, Take 2 tablets (40 mg) Daily for BP .  rosuvastatin (CRESTOR) 20 MG tablet, Take 1  tab in the evening 3 days a week for cholesterol. .  propranolol (INDERAL) 10 MG tablet, Take 1 tablet (10 mg total) by mouth 3 (three) times daily as needed (anxiety/palpitations).     Current Outpatient Medications (Other):  09-13-1995  ALPRAZolam (XANAX) 1 MG tablet, Take 1/2-1 tablet 2 - 3 x /day ONLY if needed for Anxiety Attack &  limit to 5 days /week to avoid addiction .  cholecalciferol (VITAMIN D) 1000 UNITS tablet, Take 1,000 Units by mouth daily. Take 5000 IU daily. Marland Kitchen  EPIDUO 0.1-2.5 % gel,  .  Magnesium 400 MG CAPS, Take 1 capsule by mouth daily. .  Omega-3 Fatty Acids (FISH OIL) 1000 MG CAPS, Take 2,000 mg by mouth daily. Marland Kitchen  omeprazole (PRILOSEC) 40 MG capsule, Take 1 capsule Daily for Indigestion & Heartburn .  traZODone (DESYREL) 50 MG tablet, 1/2-1 tablet for sleep .  vortioxetine HBr (TRINTELLIX) 20 MG TABS tablet, Take 1 tablet (20 mg total) by mouth daily.  Problem list He has Essential hypertension; Hyperlipidemia; Testosterone Deficiency; Vitamin D deficiency; Depression with anxiety; Overweight (BMI 25.0-29.9); Other abnormal glucose; Medication management; Esophageal reflux; and Sleep apnea on their problem list.   Review of Systems  Constitutional: Positive for activity change, appetite change, diaphoresis, fatigue and unexpected weight change. Negative for chills and fever.  HENT: Negative.   Respiratory: Negative.   Cardiovascular: Positive for palpitations. Negative for chest  pain and leg swelling.  Gastrointestinal: Positive for diarrhea, nausea and vomiting. Negative for abdominal distention, abdominal pain, anal bleeding, blood in stool, constipation and rectal pain.  Genitourinary: Negative.   Musculoskeletal: Negative.   Skin: Negative.   Neurological: Positive for tremors. Negative for dizziness, seizures, syncope, facial asymmetry, speech difficulty, weakness, light-headedness, numbness and headaches.  Psychiatric/Behavioral: Positive for agitation and sleep  disturbance. Negative for behavioral problems, confusion, decreased concentration, dysphoric mood, hallucinations, self-injury and suicidal ideas. The patient is nervous/anxious. The patient is not hyperactive.        Objective:   Physical Exam Constitutional:      Appearance: He is well-developed.  HENT:     Head: Normocephalic and atraumatic.     Right Ear: External ear normal.     Left Ear: External ear normal.  Eyes:     Conjunctiva/sclera: Conjunctivae normal.     Pupils: Pupils are equal, round, and reactive to light.  Cardiovascular:     Rate and Rhythm: Normal rate and regular rhythm.     Heart sounds: Normal heart sounds.  Pulmonary:     Effort: Pulmonary effort is normal.     Breath sounds: Normal breath sounds.  Abdominal:     General: Bowel sounds are normal.     Palpations: Abdomen is soft. There is no mass.     Tenderness: There is abdominal tenderness. There is no guarding or rebound.  Musculoskeletal:        General: Normal range of motion.     Cervical back: Normal range of motion and neck supple.  Skin:    General: Skin is warm and dry.  Neurological:     Mental Status: He is alert and oriented to person, place, and time.     Cranial Nerves: No cranial nerve deficit.        Assessment & Plan:  Ian Zavala was seen today for anxiety and medication problem.  Diarrhea, unspecified type/flushing ? serotonin syndrome from trazodone and trintellix, has been off trazodone x 2 days, will stay off, avoid medications that can increase serotonin Will rule out carcinoid/phemocytochrotoma though low possibility Check TSH, anemia, CBC, CMET -     CBC with Differential/Platelet -     COMPLETE METABOLIC PANEL WITH GFR -     TSH -     Serotonin serum -     Catecholamines, fractionated, plasma -     Metanephrines, plasma  Screening, anemia, deficiency, iron -     Iron,Total/Total Iron Binding Cap -     Vitamin B12  Anxiety -     CBC with Differential/Platelet -      COMPLETE METABOLIC PANEL WITH GFR -     TSH -     propranolol (INDERAL) 10 MG tablet; Take 1 tablet (10 mg total) by mouth 3 (three) times daily as needed (anxiety/palpitations). - can take xanax 1-2 x a day due to anxiety/fear of withdrawals With history, symptoms, and ETOH use I have STRONGLY encouraged wife and patient to see psych help and counseling No SI/HI, no hallucinations at this time Instructed patient to contact office or on-call physician promptly should condition worsen or any new symptoms appear. IF THE PATIENT HAS ANY SUICIDAL OR HOMICIDAL IDEATIONS, CALL THE OFFICE, DISCUSS WITH A SUPPORT MEMBER, OR GO TO THE ER IMMEDIATELY. Patient was agreeable with this plan.  Epigastric abdominal pain ? Nausea and vomiting from pancreatitis from ETOH use Will check labs Stop ETOH -     Amylase -  Lipase    Future Appointments  Date Time Provider Department Center  06/09/2019  2:30 PM Quentin Mulling, PA-C [facility] None  09/15/2019  2:00 PM Quentin Mulling, PA-C [facility] None  03/10/2020  3:00 PM Butch Penny, NP [facility] None

## 2019-05-14 NOTE — Patient Instructions (Addendum)
Counseling services   I suggest calling your insurance and finding out who is in your network and THEN calling those people or looking them up on google.   I'm a big fan of Cognitive Behavioral Therapy, look this up on You tube or check with the therapist you see if they are certified.  This form of therapy helps to teach you skills to better handle with current situation that are causing anxiety or depression.   There are some great apps too Check out [employer], give thanks app.  Meditations apps are great like headspace.   STOP THE TRAZODONE  CONTINUE THE TRINTELLIX  Can take propanolol AS needed for anxiety/palpitations.   Will check some labs   Alcohol Use Disorder Alcohol use disorder is when your drinking disrupts your daily life. When you have this condition, you drink too much alcohol and you cannot control your drinking. Alcohol use disorder can cause serious problems with your physical health. It can affect your brain, heart, liver, pancreas, immune system, stomach, and intestines. Alcohol use disorder can increase your risk for certain cancers and cause problems with your mental health, such as depression, anxiety, psychosis, delirium, and dementia. People with this disorder risk hurting themselves and others. What are the causes? This condition is caused by drinking too much alcohol over time. It is not caused by drinking too much alcohol only one or two times. Some people with this condition drink alcohol to cope with or escape from negative life events. Others drink to relieve pain or symptoms of mental illness. What increases the risk? You are more likely to develop this condition if:  You have a family history of alcohol use disorder.  Your culture encourages drinking to the point of intoxication, or makes alcohol easy to get.  You had a mood or conduct disorder in childhood.  You have been a victim of abuse.  You are an adolescent and: ? You have poor grades or  difficulties in school. ? Your caregivers do not talk to you about saying no to alcohol, or supervise your activities. ? You are impulsive or you have trouble with self-control. What are the signs or symptoms? Symptoms of this condition include:  Drinkingmore than you want to.  Drinking for longer than you want to.  Trying several times to drink less or to control your drinking.  Spending a lot of time getting alcohol, drinking, or recovering from drinking.  Craving alcohol.  Having problems at work, at school, or at home due to drinking.  Having problems in relationships due to drinking.  Drinking when it is dangerous to drink, such as before driving a car.  Continuing to drink even though you know you might have a physical or mental problem related to drinking.  Needing more and more alcohol to get the same effect you want from the alcohol (building up tolerance).  Having symptoms of withdrawal when you stop drinking. Symptoms of withdrawal include: ? Fatigue. ? Nightmares. ? Trouble sleeping. ? Depression. ? Anxiety. ? Fever. ? Seizures. ? Severe confusion. ? Feeling or seeing things that are not there (hallucinations). ? Tremors. ? Rapid heart rate. ? Rapid breathing. ? High blood pressure.  Drinking to avoid symptoms of withdrawal. How is this diagnosed? This condition is diagnosed with an assessment. Your health care provider may start the assessment by asking three or four questions about your drinking. Your health care provider may perform a physical exam or do lab tests to see if you have physical  problems resulting from alcohol use. She or he may refer you to a mental health professional for evaluation. How is this treated? Some people with alcohol use disorder are able to reduce their alcohol use to low-risk levels. Others need to completely quit drinking alcohol. When necessary, mental health professionals with specialized training in substance use treatment  can help. Your health care provider can help you decide how severe your alcohol use disorder is and what type of treatment you need. The following forms of treatment are available:  Detoxification. Detoxification involves quitting drinking and using prescription medicines within the first week to help lessen withdrawal symptoms. This treatment is important for people who have had withdrawal symptoms before and for heavy drinkers who are likely to have withdrawal symptoms. Alcohol withdrawal can be dangerous, and in severe cases, it can cause death. Detoxification may be provided in a home, community, or primary care setting, or in a hospital or substance use treatment facility.  Counseling. This treatment is also called talk therapy. It is provided by substance use treatment counselors. A counselor can address the reasons you use alcohol and suggest ways to keep you from drinking again or to prevent problem drinking. The goals of talk therapy are to: ? Find healthy activities and ways for you to cope with stress. ? Identify and avoid the things that trigger your alcohol use. ? Help you learn how to handle cravings.  Medicines.Medicines can help treat alcohol use disorder by: ? Decreasing alcohol cravings. ? Decreasing the positive feeling you have when you drink alcohol. ? Causing an uncomfortable physical reaction when you drink alcohol (aversion therapy).  Support groups. Support groups are led by people who have quit drinking. They provide emotional support, advice, and guidance. These forms of treatment are often combined. Some people with this condition benefit from a combination of treatments provided by specialized substance use treatment centers. Follow these instructions at home:  Take over-the-counter and prescription medicines only as told by your health care provider.  Check with your health care provider before starting any new medicines.  Ask friends and family members not to  offer you alcohol.  Avoid situations where alcohol is served, including gatherings where others are drinking alcohol.  Create a plan for what to do when you are tempted to use alcohol.  Find hobbies or activities that you enjoy that do not include alcohol.  Keep all follow-up visits as told by your health care provider. This is important. How is this prevented?  If you drink, limit alcohol intake to no more than 1 drink a day for nonpregnant women and 2 drinks a day for men. One drink equals 12 oz of beer, 5 oz of wine, or 1 oz of hard liquor.  If you have a mental health condition, get treatment and support.  Do not give alcohol to adolescents.  If you are an adolescent: ? Do not drink alcohol. ? Do not be afraid to say no if someone offers you alcohol. Speak up about why you do not want to drink. You can be a positive role model for your friends and set a good example for those around you by not drinking alcohol. ? If your friends drink, spend time with others who do not drink alcohol. Make new friends who do not use alcohol. ? Find healthy ways to manage stress and emotions, such as meditation or deep breathing, exercise, spending time in nature, listening to music, or talking with a trusted friend or family member.  Contact a health care provider if:  You are not able to take your medicines as told.  Your symptoms get worse.  You return to drinking alcohol (relapse) and your symptoms get worse. Get help right away if:  You have thoughts about hurting yourself or others. If you ever feel like you may hurt yourself or others, or have thoughts about taking your own life, get help right away. You can go to your nearest emergency department or call:  Your local emergency services (911 in the [place]  A suicide crisis helpline, such as the [place] at 438-442-1760. This is open 24 hours a day. Summary  Alcohol use disorder is when your drinking  disrupts your daily life. When you have this condition, you drink too much alcohol and you cannot control your drinking.  Treatment may include detoxification, counseling, medicine, and support groups.  Ask friends and family members not to offer you alcohol. Avoid situations where alcohol is served.  Get help right away if you have thoughts about hurting yourself or others. This information is not intended to replace advice given to you by your health care provider. Make sure you discuss any questions you have with your health care provider. Document Revised: 01/19/2017 Document Reviewed: 11/04/2015 Elsevier Patient Education  [place].

## 2019-05-15 ENCOUNTER — Telehealth: Payer: Self-pay

## 2019-05-15 ENCOUNTER — Encounter: Payer: Self-pay | Admitting: Physician Assistant

## 2019-05-15 ENCOUNTER — Other Ambulatory Visit: Payer: Self-pay | Admitting: Physician Assistant

## 2019-05-15 DIAGNOSIS — R7989 Other specified abnormal findings of blood chemistry: Secondary | ICD-10-CM

## 2019-05-15 MED ORDER — BISOPROLOL FUMARATE 10 MG PO TABS: 10.0000 mg | ORAL_TABLET | Freq: Every day | ORAL | 1 refills | Status: DC

## 2019-05-15 NOTE — Telephone Encounter (Signed)
Pt made aware via MyChart & Phone call

## 2019-05-15 NOTE — Telephone Encounter (Signed)
-----   Message from Quentin Mulling, [place] sent at 05/14/2019  3:21 PM EDT ----- Regarding: RE: interaction PER PHARMACY Contact: [phone] Okay for him to take the very low dose of the propranolol short acting 10 mg with the bisoprolol. If he gets tired on the medication do not take it again.  As I mentioned in the visit, they are both beta blockers, the bioprolol is LONG acting and he on it once a day. The propranolol is SHORT acting, very low dose and is only to be take AS needed for severe anxiety. Since they are both beta blockers, they can lower his heart rate and symptoms of that would be dizziness or fatigue. Can lower his BP too but generally his is high. So can monitor BP and HR on the medications but with the propranolol being such a low dose and short acting, I was not anticipating a bad reaction.  Marchelle Folks  ----- Message ----- From: Gregery Na, CMA Sent: 05/14/2019   2:43 PM EDT To: Quentin Mulling, PA-C Subject: interaction PER PHARMACY                       Patient WAS INFORMED that the BISOPROLOL & PROPRANOLOL should not be taken together. Patient's wife wants to make sure he should be taking the two together or should he not.  Please advise.  Please & thank you

## 2019-05-21 NOTE — Progress Notes (Signed)
Assessment and Plan: Essential hypertension At goal, go back to DASH diet/low sodium diet if sodium normal.   Depression with anxiety Continue trintellix.  I still think patient would benefit from CBT therapy, also since the patient had such a strong reactions to addition of serotonin, it was added to his allergy list but also for future adjustments of his medications he would benefit seeing a psychiatrist and or getting a gene test.   Elevated LFTs He is not drinking ETOH Continue weight loss  Hyponatremia -     COMPLETE METABOLIC PANEL WITH GFR Likely due to diarrhea brought on from medications  Other orders -     Discontinue: vortioxetine HBr (TRINTELLIX) 20 MG TABS tablet; Take 1 tablet (20 mg total) by mouth daily.     HPI 44 y.o.male presents for follow up. Patient is on trintellix, added on trazodone for sleep to cut back on xanax use at night however he started to have symptoms of serotonin syndrome. However he was increasing his ETOH use with racing thoughts and had diarrhea which resulted in a severe hyponatremia that he is here for a recheck.  Symptoms did resolve with stopping the trazodone. He was also advised to get CBT therapy for anxiety.   He is feeling better. 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 It was still emphasized to the patient that CBT therapy would be good for his anxiety.  Lab Results  Component Value Date   CREATININE 0.86 05/14/2019   BUN 11 05/14/2019   NA 129 (L) 05/14/2019   K 4.0 05/14/2019   CL 90 (L) 05/14/2019   CO2 24 05/14/2019   BMI is Body mass index is 27.46 kg/m., he is working on diet and exercise. Wt Readings from Last 3 Encounters:  05/22/19 225 lb 9.6 oz (102.3 kg)  05/14/19 229 lb (103.9 kg)  03/24/19 232 lb (105.2 kg)   Blood pressure  132/82, pulse 81, temperature (!) 97.5 F (36.4 C), height 6\' 4"  (1.93 m), weight 225 lb 9.6 oz (102.3 kg), SpO2 99 %.   Patient Active Problem List   Diagnosis Date Noted  . Sleep apnea 11/29/2017  . Esophageal reflux 04/06/2015  . Other abnormal glucose 07/15/2013  . Medication management 07/15/2013  . Overweight (BMI 25.0-29.9)   . Essential hypertension 01/09/2013  . Hyperlipidemia 01/09/2013  . Testosterone Deficiency 01/09/2013  . Vitamin D deficiency 01/09/2013  . Depression with anxiety 01/09/2013     Current Outpatient Medications (Endocrine & Metabolic):  .  testosterone cypionate (DEPOTESTOSTERONE CYPIONATE) 200 MG/ML injection, Inject 2 ml into muscle every 14 days  Current Outpatient Medications (Cardiovascular):  .  bisoprolol (ZEBETA) 10 MG tablet, Take 1 tablet (10 mg total) by mouth daily. .  enalapril (VASOTEC) 20 MG tablet, Take 2 tablets (40 mg) Daily for BP .  propranolol (INDERAL) 10 MG tablet, Take 1 tablet (10 mg total) by mouth 3 (three) times daily as needed (anxiety/palpitations). .  rosuvastatin (CRESTOR) 20 MG tablet, Take 1 tab in the evening 3 days a week for cholesterol.     Current Outpatient Medications (Other):  Marland Kitchen  ALPRAZolam (XANAX) 1 MG tablet, Take 1/2-1 tablet 2 - 3 x /day ONLY if needed for Anxiety Attack &  limit to 5 days /week to avoid addiction .  cholecalciferol (VITAMIN D) 1000 UNITS tablet, Take 1,000 Units by mouth daily. Take 5000 IU daily. Marland Kitchen  EPIDUO 0.1-2.5 % gel,  .  Magnesium 400 MG CAPS, Take 1 capsule by mouth daily. .  Omega-3 Fatty Acids (FISH OIL) 1000 MG CAPS, Take 2,000 mg by mouth daily. Marland Kitchen  omeprazole (PRILOSEC) 40 MG capsule, Take 1 capsule Daily for Indigestion & Heartburn .  traZODone (DESYREL) 50 MG tablet, 1/2-1 tablet for sleep .  vortioxetine HBr (TRINTELLIX) 20 MG TABS tablet, Take 1 tablet (20 mg total) by mouth daily.  Allergies  Allergen Reactions  . Citalopram Other (See Comments)    anxiety  .  Pravastatin   . Wellbutrin [Bupropion]   . Serotonin Reuptake Inhibitors (Ssris) Anxiety    Serotonin syndrome with trintellix and trazodone- avoid combinations that can increase risk    ROS: all negative except above.   Physical Exam: There were no vitals filed for this visit. There were no vitals taken for this visit. General Appearance: Well nourished, in no apparent distress. Eyes: PERRLA, EOMs, conjunctiva no swelling or erythema Sinuses: No Frontal/maxillary tenderness ENT/Mouth: Ext aud canals clear, TMs without erythema, bulging. No erythema, swelling, or exudate on post pharynx.  Tonsils not swollen or erythematous. Hearing normal.  Neck: Supple, thyroid normal.  Respiratory: Respiratory effort normal, BS equal bilaterally without rales, rhonchi, wheezing or stridor.  Cardio: RRR with no MRGs. Brisk peripheral pulses without edema.  Abdomen: Soft, + BS.  Non tender, no guarding, rebound, hernias, masses. Lymphatics: Non tender without lymphadenopathy.  Musculoskeletal: Full ROM, 5/5 strength, normal gait.  Skin: Warm, dry without rashes, lesions, ecchymosis.  Neuro: Cranial nerves intact. Normal muscle tone, no cerebellar symptoms. Sensation intact.  Psych: Awake and oriented X 3, normal affect, Insight and Judgment appropriate.     Quentin Mulling, PA-C 9:29 AM [facility]

## 2019-05-22 ENCOUNTER — Other Ambulatory Visit: Payer: Self-pay | Admitting: Physician Assistant

## 2019-05-22 ENCOUNTER — Ambulatory Visit (INDEPENDENT_AMBULATORY_CARE_PROVIDER_SITE_OTHER): Payer: 59 | Admitting: Physician Assistant

## 2019-05-22 ENCOUNTER — Encounter: Payer: Self-pay | Admitting: Physician Assistant

## 2019-05-22 ENCOUNTER — Other Ambulatory Visit: Payer: Self-pay

## 2019-05-22 VITALS — BP 132/82 | HR 81 | Temp 97.5°F | Ht 76.0 in | Wt 225.6 lb

## 2019-05-22 DIAGNOSIS — F418 Other specified anxiety disorders: Secondary | ICD-10-CM

## 2019-05-22 DIAGNOSIS — R7989 Other specified abnormal findings of blood chemistry: Secondary | ICD-10-CM | POA: Diagnosis not present

## 2019-05-22 DIAGNOSIS — I10 Essential (primary) hypertension: Secondary | ICD-10-CM

## 2019-05-22 DIAGNOSIS — E871 Hypo-osmolality and hyponatremia: Secondary | ICD-10-CM

## 2019-05-22 LAB — COMPLETE METABOLIC PANEL WITH GFR
AG Ratio: 1.8 (calc) (ref 1.0–2.5)
ALT: 34 U/L (ref 9–46)
AST: 31 U/L (ref 10–40)
Albumin: 4.4 g/dL (ref 3.6–5.1)
Alkaline phosphatase (APISO): 39 U/L (ref 36–130)
BUN: 8 mg/dL (ref 7–25)
CO2: 30 mmol/L (ref 20–32)
Calcium: 10 mg/dL (ref 8.6–10.3)
Chloride: 104 mmol/L (ref 98–110)
Creat: 1.1 mg/dL (ref 0.60–1.35)
GFR, Est African American: 95 mL/min/{1.73_m2} (ref >=60)
GFR, Est Non African American: 82 mL/min/{1.73_m2} (ref >=60)
Globulin: 2.5 g/dL (calc) (ref 1.9–3.7)
Glucose, Bld: 95 mg/dL (ref 65–99)
Potassium: 4.5 mmol/L (ref 3.5–5.3)
Sodium: 140 mmol/L (ref 135–146)
Total Bilirubin: 0.7 mg/dL (ref 0.2–1.2)
Total Protein: 6.9 g/dL (ref 6.1–8.1)

## 2019-05-22 MED ORDER — VORTIOXETINE HBR 20 MG PO TABS: 20.0000 mg | ORAL_TABLET | Freq: Every day | ORAL | 0 refills | Status: DC

## 2019-05-22 NOTE — Patient Instructions (Signed)
Cognitive Behavioral Therapy Cognitive behavioral therapy (CBT) is a short-term, goal-oriented type of talk therapy. CBT can help you:  Identify patterns of thinking, feeling, and behaving that are causing you problems.  Decide how you want to think, feel, and respond to life events.  Set goals to change the beliefs and thoughts that cause you to act in ways that are not helpful for you.  Follow up on the changes that you make. What are the different types of CBT? The different types of CBT include:  Dialectical behavioral therapy (DBT). This approach is often used in group therapy, and it aids a person in managing behavior by focusing on: ? Things that cause problems to start (triggers). ? Methods of self-calming. ? Re-evaluating thinking processes.  Mindfulness-based cognitive therapy. This approach involves focusing your attention, meditating, and developing awareness of the present moment (mindfulness).  Rational emotive behavior therapy. This approach uses rational thought to reframe your thinking so it is less judgmental. Your therapist may directly challenge your thought processes.  Stress inoculation training. This approach involves planning ahead for stressful situations by practicing new thoughts and behaviors. This planning can help you avoid going back to old actions.  Acceptance and commitment therapy (ACT). This approach focuses on accepting yourself as you are and practicing mindfulness. It helps you understand what you would like to change and how you can set goals in that direction. What conditions is CBT used to treat? CBT may help to treat:  Mental health conditions, including: ? Depression. ? Anxiety. ? Bipolar disorder. ? Eating disorders. ? Post-traumatic stress disorder (PTSD). ? Obsessive-compulsive disorder (OCD).  Insomnia and other sleep disorders.  Pain.  Stress.  Coping with loss or grief.  Coping with a difficult medical diagnosis or  illness.  Relationship problems.  Emotional distress or shock (trauma). How can CBT help me? CBT may:  Give you a chance to share your thoughts, feelings, problems, and fears in a safe space.  Help you focus on specific problems.  Give you homework that helps you put theory into practice. Homework may include keeping a journal or doing thinking exercises.  Help you become aware of your patterns of thinking, feeling, and behaving, and how those three patterns affect each other.  Change your thoughts so that you can change your behaviors.  Help you chose how you want to view the world.  Teach you planned coping skills and offer better ways to deal with stress and difficult situations. To make the most of CBT, make sure you:  Find a licensed therapist whom you trust.  Take an active part in your therapy and do the homework that you are given.  Are honest about your problems.  Avoid skipping your therapy sessions. Summary  Cognitive behavioral therapy (CBT) is a short-term, goal-oriented type of talk therapy.  CBT can help you become aware of your patterns and the relationships among your thoughts, feelings, and behavior.  CBT may help mental health conditions and other problems. This information is not intended to replace advice given to you by your health care provider. Make sure you discuss any questions you have with your health care provider. Document Revised: 10/30/2018 Document Reviewed: 06/20/2016 Elsevier Patient Education  2020 [employer] Inc.  

## 2019-05-28 ENCOUNTER — Other Ambulatory Visit: Payer: Self-pay | Admitting: Internal Medicine

## 2019-05-28 DIAGNOSIS — I10 Essential (primary) hypertension: Secondary | ICD-10-CM

## 2019-05-29 LAB — LIPASE: Lipase: 65 U/L — ABNORMAL HIGH (ref 7–60)

## 2019-05-29 LAB — IRON, TOTAL/TOTAL IRON BINDING CAP
%SAT: 60 % (calc) — ABNORMAL HIGH (ref 20–48)
Iron: 177 ug/dL (ref 50–180)
TIBC: 297 mcg/dL (calc) (ref 250–425)

## 2019-05-29 LAB — METANEPHRINES, PLASMA
Metanephrine, Free: 32 pg/mL (ref <=57)
Normetanephrine, Free: 114 pg/mL (ref <=148)
Total Metanephrines-Plasma: 146 pg/mL (ref <=205)

## 2019-05-29 LAB — CBC WITH DIFFERENTIAL/PLATELET
Absolute Monocytes: 360 cells/uL (ref 200–950)
Basophils Absolute: 18 cells/uL (ref 0–200)
Basophils Relative: 0.3 %
Eosinophils Absolute: 18 cells/uL (ref 15–500)
Eosinophils Relative: 0.3 %
HCT: 48.1 % (ref 38.5–50.0)
Hemoglobin: 16.2 g/dL (ref 13.2–17.1)
Lymphs Abs: 956 cells/uL (ref 850–3900)
MCH: 32.9 pg (ref 27.0–33.0)
MCHC: 33.7 g/dL (ref 32.0–36.0)
MCV: 97.8 fL (ref 80.0–100.0)
MPV: 10.4 fL (ref 7.5–12.5)
Monocytes Relative: 6.1 %
Neutro Abs: 4549 cells/uL (ref 1500–7800)
Neutrophils Relative %: 77.1 %
Platelets: 206 10*3/uL (ref 140–400)
RBC: 4.92 10*6/uL (ref 4.20–5.80)
RDW: 12.2 % (ref 11.0–15.0)
Total Lymphocyte: 16.2 %
WBC: 5.9 10*3/uL (ref 3.8–10.8)

## 2019-05-29 LAB — COMPLETE METABOLIC PANEL WITH GFR
AG Ratio: 1.8 (calc) (ref 1.0–2.5)
ALT: 49 U/L — ABNORMAL HIGH (ref 9–46)
AST: 43 U/L — ABNORMAL HIGH (ref 10–40)
Albumin: 4.6 g/dL (ref 3.6–5.1)
Alkaline phosphatase (APISO): 46 U/L (ref 36–130)
BUN: 11 mg/dL (ref 7–25)
CO2: 24 mmol/L (ref 20–32)
Calcium: 8.9 mg/dL (ref 8.6–10.3)
Chloride: 90 mmol/L — ABNORMAL LOW (ref 98–110)
Creat: 0.86 mg/dL (ref 0.60–1.35)
GFR, Est African American: 123 mL/min/{1.73_m2} (ref >=60)
GFR, Est Non African American: 106 mL/min/{1.73_m2} (ref >=60)
Globulin: 2.6 g/dL (calc) (ref 1.9–3.7)
Glucose, Bld: 81 mg/dL (ref 65–99)
Potassium: 4 mmol/L (ref 3.5–5.3)
Sodium: 129 mmol/L — ABNORMAL LOW (ref 135–146)
Total Bilirubin: 0.8 mg/dL (ref 0.2–1.2)
Total Protein: 7.2 g/dL (ref 6.1–8.1)

## 2019-05-29 LAB — CATECHOLAMINES, FRACTIONATED, PLASMA
Dopamine: 42 pg/mL — ABNORMAL HIGH
Epinephrine: 83 pg/mL
Norepinephrine: 716 pg/mL
Total Catecholamines: 841 pg/mL

## 2019-05-29 LAB — SEROTONIN SERUM: Serotonin, Serum: 25 ng/mL — ABNORMAL LOW (ref 56–244)

## 2019-05-29 LAB — VITAMIN B12: Vitamin B-12: 439 pg/mL (ref 200–1100)

## 2019-05-29 LAB — TSH: TSH: 1.23 mIU/L (ref 0.40–4.50)

## 2019-05-29 LAB — AMYLASE: Amylase: 82 U/L (ref 21–101)

## 2019-06-05 MED ORDER — ROSUVASTATIN CALCIUM 20 MG PO TABS: ORAL_TABLET | ORAL | 1 refills | Status: DC

## 2019-06-09 ENCOUNTER — Ambulatory Visit: Payer: 59 | Admitting: Physician Assistant

## 2019-07-08 ENCOUNTER — Encounter: Payer: 59 | Admitting: Adult Health Nurse Practitioner

## 2019-08-19 ENCOUNTER — Other Ambulatory Visit: Payer: Self-pay | Admitting: Internal Medicine

## 2019-09-09 ENCOUNTER — Encounter: Payer: 59 | Admitting: Adult Health Nurse Practitioner

## 2019-09-10 ENCOUNTER — Other Ambulatory Visit: Payer: Self-pay | Admitting: Internal Medicine

## 2019-09-10 DIAGNOSIS — F418 Other specified anxiety disorders: Secondary | ICD-10-CM

## 2019-09-10 MED ORDER — ALPRAZOLAM 1 MG PO TABS: ORAL_TABLET | ORAL | 0 refills | Status: DC

## 2019-09-10 NOTE — Progress Notes (Deleted)
Assessment and Plan:  Essential hypertension Continue enalapril 20mg  BID - continue medications, DASH diet, exercise and monitor at home. Call if greater than 130/80.   Depression with anxiety Monitor  Overweight (BMI 25.0-29.9) - long discussion about weight loss, diet, and exercise -recommended diet heavy in fruits and veggies and low in animal meats, cheeses, and dairy products  Hyperlipidemia -     Lipid panel Has increase to 4 days a week of the crestor  Elevated LFTs -     COMPLETE METABOLIC PANEL WITH GFR - has stopped drinking and lost weight will recehck   Future Appointments  Date Time Provider Department Center  09/15/2019  2:00 PM 09/17/2019, PA-C [facility] None  03/10/2020  3:00 PM 03/12/2020, NP [facility] None  09/14/2020  2:00 PM 09/16/2020, PA-C [facility] None      HPI 44 y.o.male presents for 3 month follow up.   His enalapril 40 mg before bed and ziac in the morning.  He has been checking his BP at home has been good.  He is on his CPAP and states he is feeling better.  BP Readings from Last 3 Encounters:  05/22/19 132/82  05/14/19 130/86  03/24/19 120/84   BMI is There is no height or weight on file to calculate BMI., he is working on diet and exercise.  He denies chest pain, shortness of breath, dizziness. Wt Readings from Last 3 Encounters:  05/22/19 225 lb 9.6 oz (102.3 kg)  05/14/19 229 lb (103.9 kg)  03/24/19 232 lb (105.2 kg)    He is on cholesterol medication, crestor 20mg  4 days a week and denies myalgias. His cholesterol is at goal. The cholesterol last visit was:   Lab Results  Component Value Date   CHOL 225 (H) 03/24/2019   HDL 37 (L) 03/24/2019   LDLCALC 163 (H) 03/24/2019   TRIG 128 03/24/2019   CHOLHDL 6.1 (H) 03/24/2019     Last A1C in the office was:  Lab Results  Component Value Date   HGBA1C 5.1 03/24/2019   Patient is on Vitamin D supplement.   Lab Results  Component Value Date   VD25OH 53 03/24/2019       Past Medical History:  Diagnosis Date  . Anxiety   . Depression   . GERD (gastroesophageal reflux disease)   . Hyperlipidemia   . Hypertension   . Hypogonadism male   . Obesity   . OSA on CPAP 2019  . Vitamin D deficiency      Allergies  Allergen Reactions  . Citalopram Other (See Comments)    anxiety  . Pravastatin   . Trazodone And Nefazodone   . Wellbutrin [Bupropion]   . Serotonin Reuptake Inhibitors (Ssris) Anxiety    Serotonin syndrome with trintellix and trazodone- avoid combinations that can increase risk    Current Outpatient Medications on File Prior to Visit  Medication Sig  . ALPRAZolam (XANAX) 1 MG tablet Take 1/2-1 tablet     2 - 3 x /day      ONLY if needed for Anxiety Attack &  limit to 5 days /week to avoid Addiction or Dementia  . bisoprolol (ZEBETA) 10 MG tablet Take 1 tablet (10 mg total) by mouth daily.  . cholecalciferol (VITAMIN D) 1000 UNITS tablet Take 1,000 Units by mouth daily. Take 5000 IU daily.  . enalapril (VASOTEC) 20 MG tablet TAKE 2 TABLETS DAILY FOR   BLOOD PRESSURE  . EPIDUO 0.1-2.5 % gel   . Magnesium 400  MG CAPS Take 1 capsule by mouth daily.  . Omega-3 Fatty Acids (FISH OIL) 1000 MG CAPS Take 2,000 mg by mouth daily.  Marland Kitchen omeprazole (PRILOSEC) 40 MG capsule TAKE 1 CAPSULE DAILY FOR   INDIGESTION AND HEARTBURN  . rosuvastatin (CRESTOR) 20 MG tablet Take 1 tab in the evening 3 days a week for cholesterol.  . testosterone cypionate (DEPOTESTOSTERONE CYPIONATE) 200 MG/ML injection Inject 2 ml into muscle every 14 days  . vortioxetine HBr (TRINTELLIX) 20 MG TABS tablet Take 1 tablet Daily for Mood & Chronic Anxiety   No current facility-administered medications on file prior to visit.    ROS: all negative except above.   Physical Exam: There were no vitals filed for this visit. There were no vitals taken for this visit. General Appearance: Well nourished, in no apparent distress. Eyes: PERRLA, EOMs, conjunctiva no swelling or  erythema Sinuses: No Frontal/maxillary tenderness ENT/Mouth: Ext aud canals clear, TMs without erythema, bulging. No erythema, swelling, or exudate on post pharynx.  Tonsils not swollen or erythematous. Hearing normal.  Neck: Supple, thyroid normal.  Respiratory: Respiratory effort normal, BS equal bilaterally without rales, rhonchi, wheezing or stridor.  Cardio: RRR with no MRGs. Brisk peripheral pulses without edema.  Abdomen: Soft, + BS.  Non tender, no guarding, rebound, hernias, masses. Lymphatics: Non tender without lymphadenopathy.  Musculoskeletal: Full ROM, 5/5 strength, normal gait.  Skin: Warm, dry without rashes, lesions, ecchymosis.  Neuro: Cranial nerves intact. Normal muscle tone, no cerebellar symptoms. Sensation intact.  Psych: Awake and oriented X 3, normal affect, Insight and Judgment appropriate.     Quentin Mulling, PA-C 9:11 PM [facility]

## 2019-09-15 ENCOUNTER — Encounter: Payer: 59 | Admitting: Physician Assistant

## 2019-10-10 ENCOUNTER — Other Ambulatory Visit: Payer: Self-pay

## 2019-10-10 ENCOUNTER — Encounter: Payer: Self-pay | Admitting: Physician Assistant

## 2019-10-10 ENCOUNTER — Ambulatory Visit: Payer: 59 | Admitting: Physician Assistant

## 2019-10-10 VITALS — BP 110/68 | HR 73 | Temp 97.3°F | Resp 14 | Ht 76.0 in | Wt 229.0 lb

## 2019-10-10 DIAGNOSIS — Z79899 Other long term (current) drug therapy: Secondary | ICD-10-CM

## 2019-10-10 DIAGNOSIS — F418 Other specified anxiety disorders: Secondary | ICD-10-CM

## 2019-10-10 DIAGNOSIS — R7309 Other abnormal glucose: Secondary | ICD-10-CM

## 2019-10-10 DIAGNOSIS — E559 Vitamin D deficiency, unspecified: Secondary | ICD-10-CM

## 2019-10-10 DIAGNOSIS — Z0001 Encounter for general adult medical examination with abnormal findings: Secondary | ICD-10-CM

## 2019-10-10 DIAGNOSIS — I10 Essential (primary) hypertension: Secondary | ICD-10-CM | POA: Diagnosis not present

## 2019-10-10 DIAGNOSIS — Z0000 Encounter for general adult medical examination without abnormal findings: Secondary | ICD-10-CM | POA: Diagnosis not present

## 2019-10-10 DIAGNOSIS — E663 Overweight: Secondary | ICD-10-CM

## 2019-10-10 DIAGNOSIS — G4730 Sleep apnea, unspecified: Secondary | ICD-10-CM

## 2019-10-10 DIAGNOSIS — K219 Gastro-esophageal reflux disease without esophagitis: Secondary | ICD-10-CM

## 2019-10-10 DIAGNOSIS — E782 Mixed hyperlipidemia: Secondary | ICD-10-CM

## 2019-10-10 DIAGNOSIS — Z136 Encounter for screening for cardiovascular disorders: Secondary | ICD-10-CM | POA: Diagnosis not present

## 2019-10-10 DIAGNOSIS — N529 Male erectile dysfunction, unspecified: Secondary | ICD-10-CM

## 2019-10-10 NOTE — Progress Notes (Signed)
Complete Physical  Assessment and Plan:   Encounter for general adult medical examination with abnormal findings 1 YEAR  Medication management -     Magnesium  Essential hypertension -     CBC with Differential/Platelet -     COMPLETE METABOLIC PANEL WITH GFR -     TSH -     Urinalysis, Routine w reflex microscopic -     Microalbumin / creatinine urine ratio -     EKG 12-Lead  Hyperlipidemia -     Lipid panel- may need to add on zetia pending numbers check lipids decrease fatty foods increase activity.   Testosterone Deficiency Hypogonadism- continue replacement therapy, check testosterone levels as needed.   Other abnormal glucose -     Hemoglobin A1c Discussed disease progression and risks Discussed diet/exercise, weight management and risk modification  Overweight (BMI 25.0-29.9) - follow up 3 months for progress monitoring - increase veggies, decrease carbs - long discussion about weight loss, diet, and exercise  Vitamin D deficiency -     VITAMIN D 25 Hydroxy (Vit-D Deficiency, Fractures)  Depression with anxiety Continue trintellix Very sensitive to serotonin syndrome- avoid medications that can increase this  Gastroesophageal reflux disease, esophagitis presence not specified Continue PPI/H2 blocker, diet discussed  Sleep apnea, unspecified type Continue CPAP  Discussed med's effects and SE's. Screening labs and tests as requested with regular follow-up as recommended. Over 40 minutes of exam, counseling, chart review and critical decision making was performed Future Appointments  Date Time Provider Department Center  03/10/2020  3:00 PM Butch Penny, NP [facility] None  10/13/2020  9:00 AM Quentin Mulling, PA-C [facility] None    HPI Patient presents for a complete physical.   His blood pressure has been controlled at home, today their BP is   He does workout. He denies chest pain, shortness of breath.   He is working night shifts, will go in at  4 pm and get home at 1-2 AM. He is using his CPAP, will normally go to bed a 3AM and sleep until 11AM. CPAP is helping with his energy.   He is trying to work out in the morning if he has a chance, can only fast walk but will get shin splints. He has started back on trintellix due to stress/pandemic. He is on CPAP and states it is helping with his energy. He will take 1/2 xanax at night and occ during the day.    BMI is Body mass index is 28.45 kg/m., he is working on diet and exercise.  His waist is 42.5 inches, goal is less than 40, weight goal 220.  Wt Readings from Last 3 Encounters:  05/22/19 225 lb 9.6 oz (102.3 kg)  05/14/19 229 lb (103.9 kg)  03/24/19 232 lb (105.2 kg)   He is on cholesterol medication, crestor 20 every other day or every 3 days and denies myalgias but will have fatigue with it. His cholesterol is not at goal. The cholesterol last visit was:   Lab Results  Component Value Date   CHOL 225 (H) 03/24/2019   HDL 37 (L) 03/24/2019   LDLCALC 163 (H) 03/24/2019   TRIG 128 03/24/2019   CHOLHDL 6.1 (H) 03/24/2019    Last A1C in the office was:  Lab Results  Component Value Date   HGBA1C 5.1 03/24/2019   Patient is on Vitamin D supplement.   Lab Results  Component Value Date   VD25OH 53 03/24/2019     Last PSA was: Lab Results  Component Value Date   PSA 0.4 06/26/2016   He has a history of testosterone deficiency and is on testosterone replacement, due tomorrow, does every 2 weeks. He states that the testosterone helps with his energy, libido, muscle mass. Lab Results  Component Value Date   TESTOSTERONE 1,576 (H) 06/27/2017     Current Medications:   Current Outpatient Medications (Endocrine & Metabolic):  .  testosterone cypionate (DEPOTESTOSTERONE CYPIONATE) 200 MG/ML injection, Inject 2 ml into muscle every 14 days  Current Outpatient Medications (Cardiovascular):  .  bisoprolol (ZEBETA) 10 MG tablet, Take 1 tablet (10 mg total) by mouth daily. .   enalapril (VASOTEC) 20 MG tablet, TAKE 2 TABLETS DAILY FOR   BLOOD PRESSURE .  rosuvastatin (CRESTOR) 20 MG tablet, Take 1 tab in the evening 3 days a week for cholesterol.     Current Outpatient Medications (Other):  Marland Kitchen  ALPRAZolam (XANAX) 1 MG tablet, Take 1/2-1 tablet     2 - 3 x /day      ONLY if needed for Anxiety Attack &  limit to 5 days /week to avoid Addiction or Dementia .  cholecalciferol (VITAMIN D) 1000 UNITS tablet, Take 1,000 Units by mouth daily. Take 5000 IU daily. Marland Kitchen  EPIDUO 0.1-2.5 % gel,  .  Magnesium 400 MG CAPS, Take 1 capsule by mouth daily. .  Omega-3 Fatty Acids (FISH OIL) 1000 MG CAPS, Take 2,000 mg by mouth daily. Marland Kitchen  omeprazole (PRILOSEC) 40 MG capsule, TAKE 1 CAPSULE DAILY FOR   INDIGESTION AND HEARTBURN .  vortioxetine HBr (TRINTELLIX) 20 MG TABS tablet, Take 1 tablet Daily for Mood & Chronic Anxiety  Allergies:  Allergies  Allergen Reactions  . Citalopram Other (See Comments)    anxiety  . Pravastatin   . Trazodone And Nefazodone   . Wellbutrin [Bupropion]   . Serotonin Reuptake Inhibitors (Ssris) Anxiety    Serotonin syndrome with trintellix and trazodone- avoid combinations that can increase risk   Health Maintenance:  Immunization History  Administered Date(s) Administered  . HPV 9-valent 06/27/2017  . Influenza Inj Mdck Quad With Preservative 11/08/2016  . Influenza Whole 12/19/2011  . Influenza, Seasonal, Injecte, Preservative Fre 02/02/2015  . PPD Test 01/09/2013, 04/06/2015  . Td 02/21/2004  . Tdap 04/06/2015   Health Maintenance  Topic Date Due  . INFLUENZA VACCINE  09/21/2019  . TETANUS/TDAP  04/05/2025  . Hepatitis C Screening  Completed  . HIV Screening  Completed    Tetanus: 2017 Pneumovax: N/A Prevnar 13: N/A Flu vaccine: 2016 Zostavax: N/A HPV: will start today  DEXA: Colonoscopy: N/A EGD: Eye Exam: Dentist:  Patient Care Team: Lucky Cowboy, MD as PCP - General (Internal Medicine) Lucky Cowboy, MD (Internal  Medicine)  Medical History:  has Essential hypertension; Hyperlipidemia; Testosterone Deficiency; Vitamin D deficiency; Depression with anxiety; Overweight (BMI 25.0-29.9); Other abnormal glucose; Medication management; Esophageal reflux; and Sleep apnea on their problem list. Surgical History:  He  has no past surgical history on file. Family History:  His family history includes Cancer (age of onset: 57) in his father; Depression in his brother, mother, and sister; Fibromyalgia in his mother; Hypertension in his brother and mother. Social History:   reports that he has never smoked. He has never used smokeless tobacco. He reports current alcohol use. He reports that he does not use drugs.  Review of Systems:  Review of Systems  Constitutional: Negative.   HENT: Negative.   Eyes: Negative.   Respiratory: Negative.   Cardiovascular: Negative.  Gastrointestinal: Negative.   Genitourinary: Negative.   Musculoskeletal: Negative.   Skin: Negative for rash (flushing on face with wind/alcohol).    Physical Exam: Estimated body mass index is 27.46 kg/m as calculated from the following:   Height as of 05/22/19: 6\' 4"  (1.93 m).   Weight as of 05/22/19: 225 lb 9.6 oz (102.3 kg). There were no vitals taken for this visit. General Appearance: Well nourished, in no apparent distress.  Eyes: PERRLA, EOMs, conjunctiva no swelling or erythema, normal fundi and vessels.  Sinuses: No Frontal/maxillary tenderness  ENT/Mouth: Ext aud canals clear, normal light reflex with TMs without erythema, bulging. Good dentition. No erythema, swelling, or exudate on post pharynx. Tonsils not swollen or erythematous. Hearing normal.  Neck: Supple, thyroid normal. No bruits  Respiratory: Respiratory effort normal, BS equal bilaterally without rales, rhonchi, wheezing or stridor.  Cardio: RRR without murmurs, rubs or gallops. Brisk peripheral pulses without edema.  Chest: symmetric, with normal excursions and  percussion.  Abdomen: Soft, nontender, no guarding, rebound, hernias, masses, or organomegaly.  Lymphatics: Non tender without lymphadenopathy.  Genitourinary: normal, no blood, non tender prostate Musculoskeletal: Full ROM all peripheral extremities,5/5 strength, and normal gait.  Skin: Warm, dry without rashes, lesions, ecchymosis. Neuro: Cranial nerves intact, reflexes equal bilaterally. Normal muscle tone, no cerebellar symptoms. Sensation intact.  Psych: Awake and oriented X 3, normal affect, Insight and Judgment appropriate.   EKG: WNL AORTA SCAN: defer  07/22/19 10:01 AM [facility]

## 2019-10-10 NOTE — Patient Instructions (Signed)

## 2019-10-11 LAB — CBC WITH DIFFERENTIAL/PLATELET
Absolute Monocytes: 356 cells/uL (ref 200–950)
Basophils Absolute: 11 cells/uL (ref 0–200)
Basophils Relative: 0.3 %
Eosinophils Absolute: 50 cells/uL (ref 15–500)
Eosinophils Relative: 1.4 %
HCT: 46.9 % (ref 38.5–50.0)
Hemoglobin: 15.8 g/dL (ref 13.2–17.1)
Lymphs Abs: 1249 cells/uL (ref 850–3900)
MCH: 33 pg (ref 27.0–33.0)
MCHC: 33.7 g/dL (ref 32.0–36.0)
MCV: 97.9 fL (ref 80.0–100.0)
MPV: 11.7 fL (ref 7.5–12.5)
Monocytes Relative: 9.9 %
Neutro Abs: 1933 cells/uL (ref 1500–7800)
Neutrophils Relative %: 53.7 %
Platelets: 191 10*3/uL (ref 140–400)
RBC: 4.79 10*6/uL (ref 4.20–5.80)
RDW: 11.7 % (ref 11.0–15.0)
Total Lymphocyte: 34.7 %
WBC: 3.6 10*3/uL — ABNORMAL LOW (ref 3.8–10.8)

## 2019-10-11 LAB — URINALYSIS, ROUTINE W REFLEX MICROSCOPIC
Bilirubin Urine: NEGATIVE
Glucose, UA: NEGATIVE
Hgb urine dipstick: NEGATIVE
Ketones, ur: NEGATIVE
Leukocytes,Ua: NEGATIVE
Nitrite: NEGATIVE
Protein, ur: NEGATIVE
Specific Gravity, Urine: 1.011 (ref 1.001–1.03)
pH: 7.5 (ref 5.0–8.0)

## 2019-10-11 LAB — COMPLETE METABOLIC PANEL WITH GFR
AG Ratio: 2.1 (calc) (ref 1.0–2.5)
ALT: 29 U/L (ref 9–46)
AST: 29 U/L (ref 10–40)
Albumin: 4.5 g/dL (ref 3.6–5.1)
Alkaline phosphatase (APISO): 40 U/L (ref 36–130)
BUN: 8 mg/dL (ref 7–25)
CO2: 30 mmol/L (ref 20–32)
Calcium: 9.5 mg/dL (ref 8.6–10.3)
Chloride: 102 mmol/L (ref 98–110)
Creat: 1.06 mg/dL (ref 0.60–1.35)
GFR, Est African American: 99 mL/min/{1.73_m2} (ref >=60)
GFR, Est Non African American: 86 mL/min/{1.73_m2} (ref >=60)
Globulin: 2.1 g/dL (calc) (ref 1.9–3.7)
Glucose, Bld: 99 mg/dL (ref 65–99)
Potassium: 4.1 mmol/L (ref 3.5–5.3)
Sodium: 138 mmol/L (ref 135–146)
Total Bilirubin: 0.9 mg/dL (ref 0.2–1.2)
Total Protein: 6.6 g/dL (ref 6.1–8.1)

## 2019-10-11 LAB — LIPID PANEL
Cholesterol: 178 mg/dL (ref <200)
HDL: 35 mg/dL — ABNORMAL LOW (ref >=40)
LDL Cholesterol (Calc): 108 mg/dL (calc) — ABNORMAL HIGH
Non-HDL Cholesterol (Calc): 143 mg/dL (calc) — ABNORMAL HIGH (ref <130)
Total CHOL/HDL Ratio: 5.1 (calc) — ABNORMAL HIGH (ref <5.0)
Triglycerides: 229 mg/dL — ABNORMAL HIGH (ref <150)

## 2019-10-11 LAB — MICROALBUMIN / CREATININE URINE RATIO
Creatinine, Urine: 97 mg/dL (ref 20–320)
Microalb Creat Ratio: 2 mcg/mg creat (ref <30)
Microalb, Ur: 0.2 mg/dL

## 2019-10-11 LAB — HEMOGLOBIN A1C
Hgb A1c MFr Bld: 5.1 % of total Hgb (ref <5.7)
Mean Plasma Glucose: 100 (calc)
eAG (mmol/L): 5.5 (calc)

## 2019-10-11 LAB — MAGNESIUM: Magnesium: 2.3 mg/dL (ref 1.5–2.5)

## 2019-10-11 LAB — TSH: TSH: 2.51 mIU/L (ref 0.40–4.50)

## 2019-10-11 LAB — VITAMIN D 25 HYDROXY (VIT D DEFICIENCY, FRACTURES): Vit D, 25-Hydroxy: 55 ng/mL (ref 30–100)

## 2019-10-27 ENCOUNTER — Other Ambulatory Visit: Payer: Self-pay | Admitting: Internal Medicine

## 2019-11-19 ENCOUNTER — Other Ambulatory Visit: Payer: Self-pay | Admitting: Internal Medicine

## 2019-11-19 DIAGNOSIS — I10 Essential (primary) hypertension: Secondary | ICD-10-CM

## 2019-11-26 ENCOUNTER — Other Ambulatory Visit: Payer: Self-pay | Admitting: Internal Medicine

## 2019-11-26 DIAGNOSIS — E291 Testicular hypofunction: Secondary | ICD-10-CM

## 2019-12-24 DIAGNOSIS — I10 Essential (primary) hypertension: Secondary | ICD-10-CM

## 2019-12-24 MED ORDER — BISOPROLOL FUMARATE 10 MG PO TABS: 10.0000 mg | ORAL_TABLET | Freq: Every day | ORAL | 1 refills | Status: DC

## 2020-01-19 ENCOUNTER — Ambulatory Visit: Payer: 59 | Admitting: Adult Health Nurse Practitioner

## 2020-01-27 ENCOUNTER — Other Ambulatory Visit: Payer: Self-pay | Admitting: Internal Medicine

## 2020-01-27 DIAGNOSIS — F418 Other specified anxiety disorders: Secondary | ICD-10-CM

## 2020-02-09 ENCOUNTER — Ambulatory Visit: Payer: 59 | Admitting: Adult Health Nurse Practitioner

## 2020-02-09 NOTE — Progress Notes (Deleted)
FOLLOW UP 3 MONTH  Assessment and Plan:   Medication management -     Magnesium  Essential hypertension -     CBC with Differential/Platelet -     COMPLETE METABOLIC PANEL WITH GFR -     TSH -     Urinalysis, Routine w reflex microscopic -     Microalbumin / creatinine urine ratio -     EKG 12-Lead  Hyperlipidemia -     Lipid panel- may need to add on zetia pending numbers check lipids decrease fatty foods increase activity.   Testosterone Deficiency Hypogonadism- continue replacement therapy, check testosterone levels as needed.   Other abnormal glucose -     Hemoglobin A1c Discussed disease progression and risks Discussed diet/exercise, weight management and risk modification  Overweight (BMI 25.0-29.9) - follow up 3 months for progress monitoring - increase veggies, decrease carbs - long discussion about weight loss, diet, and exercise  Vitamin D deficiency -     VITAMIN D 25 Hydroxy (Vit-D Deficiency, Fractures)  Depression with anxiety Continue trintellix Very sensitive to serotonin syndrome- avoid medications that can increase this  Gastroesophageal reflux disease, esophagitis presence not specified Continue PPI/H2 blocker, diet discussed  Sleep apnea, unspecified type Continue CPAP  Medication Management Continued  Discussed med's effects and SE's. Screening labs and tests as requested with regular follow-up as recommended. Over 30 minutes of face to face interview, exam, counseling, chart review and critical decision making was performed Future Appointments  Date Time Provider Department Center  02/09/2020 11:00 AM Elder Negus, NP [facility] None  03/10/2020  3:00 PM Butch Penny, NP [facility] None  10/13/2020  9:00 AM Elder Negus, NP [facility] None    HPI Patient presents for a complete physical.   His blood pressure has been controlled at home, today their BP is   He does workout. He denies chest pain, shortness of breath.   He is  working night shifts, will go in at 4 pm and get home at 1-2 AM. He is using his CPAP, will normally go to bed a 3AM and sleep until 11AM. CPAP is helping with his energy.   He is trying to work out in the morning if he has a chance, can only fast walk but will get shin splints. He has started back on trintellix due to stress/pandemic. He is on CPAP and states it is helping with his energy. He will take 1/2 xanax at night and occ during the day.    BMI is Body mass index is 28.45 kg/m., he is working on diet and exercise.  His waist is 42.5 inches, goal is less than 40, weight goal 220.  Wt Readings from Last 3 Encounters:  10/10/19 229 lb (103.9 kg)  05/22/19 225 lb 9.6 oz (102.3 kg)  05/14/19 229 lb (103.9 kg)   He is on cholesterol medication, crestor 20 every other day or every 3 days and denies myalgias but will have fatigue with it. His cholesterol is not at goal. The cholesterol last visit was:   Lab Results  Component Value Date   CHOL 178 10/10/2019   HDL 35 (L) 10/10/2019   LDLCALC 108 (H) 10/10/2019   TRIG 229 (H) 10/10/2019   CHOLHDL 5.1 (H) 10/10/2019    Last A1C in the office was:  Lab Results  Component Value Date   HGBA1C 5.1 10/10/2019   Patient is on Vitamin D supplement.   Lab Results  Component Value Date   VD25OH 55 10/10/2019  Last PSA was: Lab Results  Component Value Date   PSA 0.4 06/26/2016   He has a history of testosterone deficiency and is on testosterone replacement, due tomorrow, does every 2 weeks. He states that the testosterone helps with his energy, libido, muscle mass. Lab Results  Component Value Date   TESTOSTERONE 1,576 (H) 06/27/2017     Current Medications:   Current Outpatient Medications (Endocrine & Metabolic):  .  testosterone cypionate (DEPOTESTOSTERONE CYPIONATE) 200 MG/ML injection, Inject     2 ml      into muscle       every 14 days  Current Outpatient Medications (Cardiovascular):  .  bisoprolol (ZEBETA) 10 MG  tablet, Take 1 tablet (10 mg total) by mouth daily. .  enalapril (VASOTEC) 20 MG tablet, TAKE 2 TABLETS DAILY FOR   BLOOD PRESSURE .  rosuvastatin (CRESTOR) 20 MG tablet, Take 1 tab in the evening 3 days a week for cholesterol.     Current Outpatient Medications (Other):  Marland Kitchen  ALPRAZolam (XANAX) 1 MG tablet, TAKE 1/2 TO 1 TABLET 2-3 TIMES DAILY ONLY IF NEEDED FOR ANXIETY ATTACK AND LIMIT TO 5 DAYS A WEEK TO AVOID ADDICTION OR DEMENTIA .  cholecalciferol (VITAMIN D) 1000 UNITS tablet, Take 1,000 Units by mouth daily. Take 5000 IU daily. Marland Kitchen  EPIDUO 0.1-2.5 % gel,  .  Magnesium 400 MG CAPS, Take 1 capsule by mouth daily. .  Omega-3 Fatty Acids (FISH OIL) 1000 MG CAPS, Take 2,000 mg by mouth daily. Marland Kitchen  omeprazole (PRILOSEC) 40 MG capsule, TAKE 1 CAPSULE DAILY FOR   INDIGESTION AND HEARTBURN .  vortioxetine HBr (TRINTELLIX) 20 MG TABS tablet, Take 1 tablet Daily for Mood & Chronic Anxiety  Allergies:  Allergies  Allergen Reactions  . Citalopram Other (See Comments)    anxiety  . Pravastatin   . Trazodone And Nefazodone   . Wellbutrin [Bupropion]   . Serotonin Reuptake Inhibitors (Ssris) Anxiety    Serotonin syndrome with trintellix and trazodone- avoid combinations that can increase risk   Health Maintenance:  Immunization History  Administered Date(s) Administered  . HPV 9-valent 06/27/2017  . Influenza Inj Mdck Quad With Preservative 11/08/2016  . Influenza Whole 12/19/2011  . Influenza, Seasonal, Injecte, Preservative Fre 02/02/2015  . PPD Test 01/09/2013, 04/06/2015  . Td 02/21/2004  . Tdap 04/06/2015   Health Maintenance  Topic Date Due  . INFLUENZA VACCINE  09/21/2019  . TETANUS/TDAP  04/05/2025  . Hepatitis C Screening  Completed  . HIV Screening  Completed    Tetanus: 2017 Pneumovax: N/A Prevnar 13: N/A Flu vaccine: 2016 Zostavax: N/A HPV: will start today  DEXA: Colonoscopy: N/A EGD: Eye Exam: Dentist:  Patient Care Team: Lucky Cowboy, MD as PCP -  General (Internal Medicine) Lucky Cowboy, MD (Internal Medicine)  Medical History:  has Essential hypertension; Hyperlipidemia; Testosterone Deficiency; Vitamin D deficiency; Depression with anxiety; Overweight (BMI 25.0-29.9); Other abnormal glucose; Medication management; Esophageal reflux; and Sleep apnea on their problem list. Surgical History:  He  has no past surgical history on file. Family History:  His family history includes Cancer (age of onset: 19) in his father; Depression in his brother, mother, and sister; Fibromyalgia in his mother; Hypertension in his brother and mother. Social History:   reports that he has never smoked. He has never used smokeless tobacco. He reports current alcohol use. He reports that he does not use drugs.  Review of Systems:  Review of Systems  Constitutional: Negative.   HENT: Negative.  Eyes: Negative.   Respiratory: Negative.   Cardiovascular: Negative.   Gastrointestinal: Negative.   Genitourinary: Negative.   Musculoskeletal: Negative.   Skin: Negative for rash (flushing on face with wind/alcohol).    Physical Exam: Estimated body mass index is 27.87 kg/m as calculated from the following:   Height as of 10/10/19: 6\' 4"  (1.93 m).   Weight as of 10/10/19: 229 lb (103.9 kg). There were no vitals taken for this visit. General Appearance: Well nourished, in no apparent distress.  Eyes: PERRLA, EOMs, conjunctiva no swelling or erythema, normal fundi and vessels.  Sinuses: No Frontal/maxillary tenderness  ENT/Mouth: Ext aud canals clear, normal light reflex with TMs without erythema, bulging. Good dentition. No erythema, swelling, or exudate on post pharynx. Tonsils not swollen or erythematous. Hearing normal.  Neck: Supple, thyroid normal. No bruits  Respiratory: Respiratory effort normal, BS equal bilaterally without rales, rhonchi, wheezing or stridor.  Cardio: RRR without murmurs, rubs or gallops. Brisk peripheral pulses without edema.   Chest: symmetric, with normal excursions and percussion.  Abdomen: Soft, nontender, no guarding, rebound, hernias, masses, or organomegaly.  Lymphatics: Non tender without lymphadenopathy.  Genitourinary: normal, no blood, non tender prostate Musculoskeletal: Full ROM all peripheral extremities,5/5 strength, and normal gait.  Skin: Warm, dry without rashes, lesions, ecchymosis. Neuro: Cranial nerves intact, reflexes equal bilaterally. Normal muscle tone, no cerebellar symptoms. Sensation intact.  Psych: Awake and oriented X 3, normal affect, Insight and Judgment appropriate.   EKG: WNL AORTA SCAN: defer  Ian Zavala 12:02 AM [facility]

## 2020-02-18 ENCOUNTER — Other Ambulatory Visit: Payer: Self-pay | Admitting: Internal Medicine

## 2020-02-18 MED ORDER — OMEPRAZOLE 40 MG PO CPDR: DELAYED_RELEASE_CAPSULE | ORAL | 1 refills | Status: DC

## 2020-02-24 ENCOUNTER — Other Ambulatory Visit: Payer: Self-pay | Admitting: Internal Medicine

## 2020-03-10 ENCOUNTER — Other Ambulatory Visit: Payer: Self-pay | Admitting: Internal Medicine

## 2020-03-10 ENCOUNTER — Ambulatory Visit: Payer: Managed Care, Other (non HMO) | Admitting: Adult Health

## 2020-03-10 MED ORDER — AZITHROMYCIN 250 MG PO TABS: ORAL_TABLET | ORAL | 1 refills | Status: DC

## 2020-03-10 MED ORDER — DEXAMETHASONE 4 MG PO TABS: ORAL_TABLET | ORAL | 0 refills | Status: DC

## 2020-05-17 ENCOUNTER — Other Ambulatory Visit: Payer: Self-pay | Admitting: Internal Medicine

## 2020-05-17 DIAGNOSIS — I10 Essential (primary) hypertension: Secondary | ICD-10-CM

## 2020-05-23 ENCOUNTER — Encounter: Payer: Self-pay | Admitting: Internal Medicine

## 2020-05-23 NOTE — Progress Notes (Signed)
Future Appointments  Date Time Provider Department Center  05/24/2020 11:30 AM Lucky Cowboy, MD [facility] None  10/13/2020  9:00 AM Elder Negus, NP [facility] None     History of Present Illness:       This very nice 45 y.o. MWM presents for 6 month follow up with HTN, HLD, Pre-Diabetes and Vitamin D Deficiency.        Patient had been on Trintellix reporting increased agitation and sense of dysphoria when Trazodone added. 20+ years ago he had taken Zoloft for Depression/Dysthymia and desires to retry Zoloft.        Patient is treated for HTN since age in 38  & BP has been controlled at home. Today's BP is at goal - 126/86. Patient has had no complaints of any cardiac type chest pain, palpitations, dyspnea / orthopnea / PND, dizziness, claudication, or dependent edema.       Hyperlipidemia is controlled with diet & Rosuvastatin 20 mg 3 x /week. Patient report flu-like myalgias  For 24-36 hrs after taking Rosuvastatin  or other med SE's. Last Lipids were not at goal:   Lab Results  Component Value Date   CHOL 178 10/10/2019   HDL 35 (L) 10/10/2019   LDLCALC 108 (H) 10/10/2019   TRIG 229 (H) 10/10/2019   CHOLHDL 5.1 (H) 10/10/2019     Also, the patient has history of PreDiabetes (A1c 5.8%/2015) and has had no symptoms of reactive hypoglycemia, diabetic polys, paresthesias or visual blurring.  Last A1c was normal & at goal:  Lab Results  Component Value Date   HGBA1C 5.1 10/10/2019       Patien has hx/o Testosterone deficiency ("245" in 2012 ) and has been on Testosterone Replacement since.        Further, the patient also has history of Vitamin D Deficiency ("25" /2008) and supplements vitamin D without any suspected side-effects. Last vitamin D was near goal (70-100):   Lab Results  Component Value Date   VD25OH 55 10/10/2019    Current Outpatient Medications on File Prior to Visit  Medication Sig  . ALPRAZolam 1 MG tablet TAKE 1/2 - 1 TAB 2-3 x DAILY  ONLY IF NEEDED   . bisoprolol  10 MG tablet Take 1 tablet  daily.  Marland Kitchen VITAMIN D 5000 IU  Take daily.  . enalapril 20 MG tablet TAKE 2 TABLETS DAILY FOR   BLOOD PRESSURE  . EPIDUO 0.1-2.5 % gel for Acne  . Magnesium 400 MG CAPS Take 1 capsuledaily.  . Omega-3 FISH OIL 2,000 mg Take   daily.  Marland Kitchen omeprazole  MG capsule Take  1 capsule Daily   . rosuvastatin 20 MG tablet Take 1 tab in the evening 3 days a week   . testosterone cypio 200 MG/ML injec Inject 2 ml into muscle every 14 days    Allergies  Allergen Reactions  . Citalopram Other (See Comments)    anxiety  . Pravastatin   . Trazodone And Nefazodone   . Wellbutrin [Bupropion]   . Serotonin Reuptake Inhibitors (SSRI's) Anxiety    Serotonin syndrome with trintellix and trazodone- avoid combinations that can increase risk    PMHx:   Past Medical History:  Diagnosis Date  . Anxiety   . Depression   . GERD (gastroesophageal reflux disease)   . Hyperlipidemia   . Hypertension   . Hypogonadism male   . Obesity   . OSA on CPAP 2019  . Vitamin D deficiency  Immunization History  Administered Date(s) Administered  . HPV 9-valent 06/27/2017  . Influenza Inj Mdck Quad With Preservative 11/08/2016  . Influenza Whole 12/19/2011  . Influenza, Seasonal, Injecte, Preservative Fre 02/02/2015  . PPD Test 01/09/2013, 04/06/2015  . Td 02/21/2004  . Tdap 04/06/2015    No past surgical history on file.  FHx:    Reviewed / unchanged  SHx:    Reviewed / unchanged   Systems Review:  Constitutional: Denies fever, chills, wt changes, headaches, insomnia, fatigue, night sweats, change in appetite. Eyes: Denies redness, blurred vision, diplopia, discharge, itchy, watery eyes.  ENT: Denies discharge, congestion, post nasal drip, epistaxis, sore throat, earache, hearing loss, dental pain, tinnitus, vertigo, sinus pain, snoring.  CV: Denies chest pain, palpitations, irregular heartbeat, syncope, dyspnea, diaphoresis, orthopnea, PND,  claudication or edema. Respiratory: denies cough, dyspnea, DOE, pleurisy, hoarseness, laryngitis, wheezing.  Gastrointestinal: Denies dysphagia, odynophagia, heartburn, reflux, water brash, abdominal pain or cramps, nausea, vomiting, bloating, diarrhea, constipation, hematemesis, melena, hematochezia  or hemorrhoids. Genitourinary: Denies dysuria, frequency, urgency, nocturia, hesitancy, discharge, hematuria or flank pain. Musculoskeletal: Denies arthralgias, myalgias, stiffness, jt. swelling, pain, limping or strain/sprain.  Skin: Denies pruritus, rash, hives, warts, acne, eczema or change in skin lesion(s). Neuro: No weakness, tremor, incoordination, spasms, paresthesia or pain. Psychiatric: Denies confusion, memory loss or sensory loss. Endo: Denies change in weight, skin or hair change.  Heme/Lymph: No excessive bleeding, bruising or enlarged lymph nodes.  Physical Exam  BP 126/86   Pulse 75   Temp (!) 97.2 F (36.2 C)   Resp 16   Ht 6\' 4"  (1.93 m)   Wt 234 lb 12.8 oz (106.5 kg)   SpO2 98%   BMI 28.58 kg/m   Appears  well nourished, well groomed  and in no distress.  Eyes: PERRLA, EOMs, conjunctiva no swelling or erythema. Sinuses: No frontal/maxillary tenderness ENT/Mouth: EAC's clear, TM's nl w/o erythema, bulging. Nares clear w/o erythema, swelling, exudates. Oropharynx clear without erythema or exudates. Oral hygiene is good. Tongue normal, non obstructing. Hearing intact.  Neck: Supple. Thyroid not palpable. Car 2+/2+ without bruits, nodes or JVD. Chest: Respirations nl with BS clear & equal w/o rales, rhonchi, wheezing or stridor.  Cor: Heart sounds normal w/ regular rate and rhythm without sig. murmurs, gallops, clicks or rubs. Peripheral pulses normal and equal  without edema.  Abdomen: Soft & bowel sounds normal. Non-tender w/o guarding, rebound, hernias, masses or organomegaly.  Lymphatics: Unremarkable.  Musculoskeletal: Full ROM all peripheral extremities, joint  stability, 5/5 strength and normal gait.  Skin: Warm, dry without exposed rashes, lesions or ecchymosis apparent.  Neuro: Cranial nerves intact, reflexes equal bilaterally. Sensory-motor testing grossly intact. Tendon reflexes grossly intact.  Pysch: Alert & oriented x 3.  Insight and judgement nl & appropriate. No ideations.  Assessment and Plan:  1. Essential hypertension  - Continue medication, monitor blood pressure at home.  - Continue DASH diet.  Reminder to go to the ER if any CP,  SOB, nausea, dizziness, severe HA, changes vision/speech.  - CBC with Differential/Platelet - COMPLETE METABOLIC PANEL WITH GFR - Magnesium - TSH  2. Hyperlipidemia, mixed  - Continue diet/meds, exercise,& lifestyle modifications.  - Continue monitor periodic cholesterol/liver & renal functions   - Lipid panel - TSH  3. Abnormal glucose  - Continue diet, exercise  - Lifestyle modifications.  - Monitor appropriate labs  - Hemoglobin A1c - Insulin, random  4. Vitamin D deficiency  - Continue supplementation.  - VITAMIN D 25 Hydroxy  5.  Testosterone Deficiency  - Testosterone  6. Medication management  - CBC with Differential/Platelet - COMPLETE METABOLIC PANEL WITH GFR - Magnesium - Lipid panel - TSH - Hemoglobin A1c - Insulin, random - VITAMIN D 25 Hydroxy - Testosterone       Discussed  regular exercise, BP monitoring, weight control to achieve/maintain BMI less than 25 and discussed med and SE's. Recommended labs to assess and monitor clinical status with further disposition pending results of labs.  Will restart Zoloft and patient advised if develops side-effects to cut dose in 1/2 . I discussed the assessment and treatment plan with the patient. The patient was provided an opportunity to ask questions and all were answered. The patient agreed with the plan and demonstrated an understanding of the instructions.  I provided over 30 minutes of exam, counseling, chart review  and  complex critical decision making.       The patient was advised to call back or seek an in-person evaluation if the symptoms worsen or if the condition fails to improve as anticipated.   Marinus Maw, MD

## 2020-05-23 NOTE — Patient Instructions (Signed)

## 2020-05-24 ENCOUNTER — Other Ambulatory Visit: Payer: Self-pay | Admitting: Internal Medicine

## 2020-05-24 ENCOUNTER — Ambulatory Visit (INDEPENDENT_AMBULATORY_CARE_PROVIDER_SITE_OTHER): Payer: 59 | Admitting: Internal Medicine

## 2020-05-24 ENCOUNTER — Other Ambulatory Visit: Payer: Self-pay

## 2020-05-24 VITALS — BP 126/86 | HR 75 | Temp 97.2°F | Resp 16 | Ht 76.0 in | Wt 234.8 lb

## 2020-05-24 DIAGNOSIS — E782 Mixed hyperlipidemia: Secondary | ICD-10-CM | POA: Diagnosis not present

## 2020-05-24 DIAGNOSIS — N529 Male erectile dysfunction, unspecified: Secondary | ICD-10-CM

## 2020-05-24 DIAGNOSIS — E559 Vitamin D deficiency, unspecified: Secondary | ICD-10-CM | POA: Diagnosis not present

## 2020-05-24 DIAGNOSIS — I10 Essential (primary) hypertension: Secondary | ICD-10-CM | POA: Diagnosis not present

## 2020-05-24 DIAGNOSIS — R7309 Other abnormal glucose: Secondary | ICD-10-CM

## 2020-05-24 DIAGNOSIS — Z79899 Other long term (current) drug therapy: Secondary | ICD-10-CM

## 2020-05-24 MED ORDER — SERTRALINE HCL 50 MG PO TABS: ORAL_TABLET | ORAL | 0 refills | Status: DC

## 2020-05-24 MED ORDER — SERTRALINE HCL 50 MG PO TABS: ORAL_TABLET | ORAL | 3 refills | Status: DC

## 2020-05-25 LAB — HEMOGLOBIN A1C
Hgb A1c MFr Bld: 5 % of total Hgb (ref <5.7)
Mean Plasma Glucose: 97 mg/dL
eAG (mmol/L): 5.4 mmol/L

## 2020-05-25 LAB — CBC WITH DIFFERENTIAL/PLATELET
Absolute Monocytes: 460 cells/uL (ref 200–950)
Basophils Absolute: 30 cells/uL (ref 0–200)
Basophils Relative: 0.6 %
Eosinophils Absolute: 50 cells/uL (ref 15–500)
Eosinophils Relative: 1 %
HCT: 49.1 % (ref 38.5–50.0)
Hemoglobin: 16.6 g/dL (ref 13.2–17.1)
Lymphs Abs: 1205 cells/uL (ref 850–3900)
MCH: 32.1 pg (ref 27.0–33.0)
MCHC: 33.8 g/dL (ref 32.0–36.0)
MCV: 95 fL (ref 80.0–100.0)
MPV: 11.2 fL (ref 7.5–12.5)
Monocytes Relative: 9.2 %
Neutro Abs: 3255 cells/uL (ref 1500–7800)
Neutrophils Relative %: 65.1 %
Platelets: 243 10*3/uL (ref 140–400)
RBC: 5.17 10*6/uL (ref 4.20–5.80)
RDW: 11.6 % (ref 11.0–15.0)
Total Lymphocyte: 24.1 %
WBC: 5 10*3/uL (ref 3.8–10.8)

## 2020-05-25 LAB — COMPLETE METABOLIC PANEL WITH GFR
AG Ratio: 1.9 (calc) (ref 1.0–2.5)
ALT: 18 U/L (ref 9–46)
AST: 22 U/L (ref 10–40)
Albumin: 4.7 g/dL (ref 3.6–5.1)
Alkaline phosphatase (APISO): 47 U/L (ref 36–130)
BUN: 7 mg/dL (ref 7–25)
CO2: 29 mmol/L (ref 20–32)
Calcium: 9.8 mg/dL (ref 8.6–10.3)
Chloride: 102 mmol/L (ref 98–110)
Creat: 1.13 mg/dL (ref 0.60–1.35)
GFR, Est African American: 91 mL/min/{1.73_m2} (ref >=60)
GFR, Est Non African American: 79 mL/min/{1.73_m2} (ref >=60)
Globulin: 2.5 g/dL (calc) (ref 1.9–3.7)
Glucose, Bld: 93 mg/dL (ref 65–99)
Potassium: 4.8 mmol/L (ref 3.5–5.3)
Sodium: 141 mmol/L (ref 135–146)
Total Bilirubin: 1.1 mg/dL (ref 0.2–1.2)
Total Protein: 7.2 g/dL (ref 6.1–8.1)

## 2020-05-25 LAB — MAGNESIUM: Magnesium: 2.5 mg/dL (ref 1.5–2.5)

## 2020-05-25 LAB — LIPID PANEL
Cholesterol: 213 mg/dL — ABNORMAL HIGH (ref <200)
HDL: 42 mg/dL (ref >=40)
LDL Cholesterol (Calc): 127 mg/dL (calc) — ABNORMAL HIGH
Non-HDL Cholesterol (Calc): 171 mg/dL (calc) — ABNORMAL HIGH (ref <130)
Total CHOL/HDL Ratio: 5.1 (calc) — ABNORMAL HIGH (ref <5.0)
Triglycerides: 288 mg/dL — ABNORMAL HIGH (ref <150)

## 2020-05-25 LAB — INSULIN, RANDOM: Insulin: 3.3 u[IU]/mL

## 2020-05-25 LAB — TESTOSTERONE: Testosterone: 526 ng/dL (ref 250–827)

## 2020-05-25 LAB — VITAMIN D 25 HYDROXY (VIT D DEFICIENCY, FRACTURES): Vit D, 25-Hydroxy: 48 ng/mL (ref 30–100)

## 2020-05-25 LAB — TSH: TSH: 2.15 mIU/L (ref 0.40–4.50)

## 2020-05-25 NOTE — Progress Notes (Signed)
============================================================ - Test results slightly outside the reference range are not unusual. If there is anything important, I will review this with you,  otherwise it is considered normal test values.  If you have further questions,  please do not hesitate to contact me at the office or via My Chart.  ============================================================ ============================================================  -  Total Cholesterol = 213 - Elevated !     (Ideal or Goal is less than 180)  - and   - Bad /Dangerous LDL Chol = 127 - Very Bad  !       (Ideal or goal is less than 70  !  )   -  Recommend a much stricter  low cholesterol diet   - Cholesterol only comes from animal sources  - ie. meat, dairy, egg yolks  - Eat all the vegetables you want.  - Avoid meat, especially red meat - Beef AND Pork .  - Avoid cheese & dairy - milk & ice cream.     - Cheese is the most concentrated form of trans-fats which  is the worst thing to clog up our arteries.   - Veggie cheese is OK which can be found in the fresh  produce section at [employer] or [employer] or [employer] ============================================================ ============================================================  - also Triglycerides (   288   ) or fats in blood are too high  (goal is less than 150)    - Recommend avoid fried & greasy foods,  sweets / candy,   - Avoid white rice  (brown or wild rice or Quinoa is OK),   - Avoid white potatoes  (sweet potatoes are OK)   - Avoid anything made from white flour  - bagels, doughnuts, rolls, buns, biscuits, white and   wheat breads, pizza crust and traditional  pasta made of white flour & egg white  - (vegetarian pasta or spinach or wheat pasta is OK).    - Multi-grain bread is OK - like multi-grain flat bread or  sandwich thins.   - Avoid alcohol in excess.   - Exercise is also  important. ============================================================ ============================================================  - A1c - Normal - Great - No Diabetes ============================================================ ============================================================  - Vitamin D =48 - Low   - Vitamin D goal is between 70-100.   - Please INCREASE your Vitamin D 5,000 units  to                                                                              2 capsules = 10,000 units /day   - It is very important as a natural anti-inflammatory and helping the  immune system protect against viral infections, like the Covid-19    helping hair, skin, and nails, as well as reducing stroke and  heart attack risk.   - It helps your bones and helps with mood.  - It also decreases numerous cancer risks so please  take it as directed.   - Low Vit D is associated with a 200-300% higher risk for  CANCER   and 200-300% higher risk for HEART   ATTACK  &  STROKE.    - It is also associated with higher death rate at younger ages,  autoimmune diseases like Rheumatoid arthritis, Lupus,  Multiple Sclerosis.     - Also many other serious conditions, like depression, Alzheimer's  Dementia, infertility, muscle aches, fatigue, fibromyalgia   - just to name a few. ============================================================ ============================================================  - Testosterone level is normal  ============================================================ ============================================================  - All Else - CBC - Kidneys - Electrolytes - Liver - Magnesium & Thyroid    - all  Normal / OK ============================================================ ============================================================

## 2020-06-08 ENCOUNTER — Other Ambulatory Visit: Payer: Self-pay | Admitting: Adult Health Nurse Practitioner

## 2020-06-08 DIAGNOSIS — I10 Essential (primary) hypertension: Secondary | ICD-10-CM

## 2020-07-11 ENCOUNTER — Other Ambulatory Visit: Payer: Self-pay | Admitting: Internal Medicine

## 2020-07-11 DIAGNOSIS — E291 Testicular hypofunction: Secondary | ICD-10-CM

## 2020-07-11 DIAGNOSIS — F418 Other specified anxiety disorders: Secondary | ICD-10-CM

## 2020-08-13 ENCOUNTER — Other Ambulatory Visit: Payer: Self-pay | Admitting: Internal Medicine

## 2020-08-20 ENCOUNTER — Other Ambulatory Visit: Payer: Self-pay | Admitting: Internal Medicine

## 2020-09-14 ENCOUNTER — Encounter: Payer: 59 | Admitting: Physician Assistant

## 2020-10-01 ENCOUNTER — Other Ambulatory Visit: Payer: Self-pay | Admitting: Internal Medicine

## 2020-10-01 DIAGNOSIS — F418 Other specified anxiety disorders: Secondary | ICD-10-CM

## 2020-10-13 ENCOUNTER — Encounter: Payer: 59 | Admitting: Adult Health Nurse Practitioner

## 2020-10-15 ENCOUNTER — Encounter: Payer: 59 | Admitting: Internal Medicine

## 2020-10-15 NOTE — Progress Notes (Deleted)
Complete Physical  Assessment and Plan:  Encounter for Annual Physical Exam with abnormal findings Due annually  Health Maintenance reviewed Healthy lifestyle reviewed and goals set  Essential hypertension Continue medication Monitor blood pressure at home; call if consistently over 130/80 Continue DASH diet.   Reminder to go to the ER if any CP, SOB, nausea, dizziness, severe HA, changes vision/speech, left arm numbness and tingling and jaw pain. -     CBC with Differential/Platelet -     COMPLETE METABOLIC PANEL WITH GFR -     TSH -     Urinalysis, Routine w reflex microscopic -     Microalbumin / creatinine urine ratio -     EKG 12-Lead  Hyperlipidemia -     Lipid panel- may need to add on zetia pending numbers *** check lipids decrease fatty foods increase activity.   Testosterone Deficiency Hypogonadism- continue replacement therapy, check testosterone levels as needed.   Other abnormal glucose -     Hemoglobin A1c Discussed disease progression and risks Discussed diet/exercise, weight management and risk modification  Overweight (BMI 25.0-29.9) - follow up 3 months for progress monitoring - increase veggies, decrease carbs - long discussion about weight loss, diet, and exercise  Vitamin D deficiency -     VITAMIN D 25 Hydroxy (Vit-D Deficiency, Fractures)  Depression with anxiety Continue trintellix Very sensitive to serotonin syndrome- avoid medications that can increase this  Gastroesophageal reflux disease, esophagitis presence not specified Continue PPI/H2 blocker, diet discussed  Sleep apnea, unspecified type Continue CPAP, weight loss encouraged  No orders of the defined types were placed in this encounter.   Discussed med's effects and SE's. Screening labs and tests as requested with regular follow-up as recommended. Over 40 minutes of exam, counseling, chart review and critical decision making was performed Future Appointments  Date Time  Provider Department Center  10/18/2020  2:00 PM Judd Gaudier, NP [facility] None  10/17/2021 10:00 AM Lucky Cowboy, MD [facility] None    HPI 45 y.o. Male patient presents for a complete physical. He has Essential hypertension; Hyperlipidemia; Testosterone Deficiency; Vitamin D deficiency; Depression with anxiety; Overweight (BMI 25.0-29.9); Other abnormal glucose; Medication management; Esophageal reflux; and Sleep apnea on their problem list.  He is married, 2 teen boys, works for [employer].   He is trying to work out in the morning if he has a chance, can only fast walk but will get shin splints.  Hx of depression/anxiety, he was back on trintellix due to stress/pandemic but was transitioned to zoloft 50 mg *** He is on CPAP and states it is helping with his energy. He will take 1/2 xanax at night and occ during the day.    BMI is There is no height or weight on file to calculate BMI., he {HAS HAS [id number]} been working on diet and exercise. His waist is 42.5 inches, goal is less than 40, weight goal 220.  Wt Readings from Last 3 Encounters:  05/24/20 234 lb 12.8 oz (106.5 kg)  10/10/19 229 lb (103.9 kg)  05/22/19 225 lb 9.6 oz (102.3 kg)   His blood pressure has been controlled at home, today their BP is   He does workout. He denies chest pain, shortness of breath.   He is on cholesterol medication, crestor 20 every other day or every 3 days and denies myalgias but will have fatigue with it. His cholesterol is not at goal. The cholesterol last visit was:   Lab Results  Component Value Date  CHOL 213 (H) 05/24/2020   HDL 42 05/24/2020   LDLCALC 127 (H) 05/24/2020   TRIG 288 (H) 05/24/2020   CHOLHDL 5.1 (H) 05/24/2020    Last A1C in the office was:  Lab Results  Component Value Date   HGBA1C 5.0 05/24/2020   Patient is on Vitamin D supplement.   Lab Results  Component Value Date   VD25OH 48 05/24/2020     *** Last PSA was: Lab Results  Component Value Date   PSA  0.4 06/26/2016   He has a history of testosterone deficiency and is on testosterone replacement, due tomorrow, does every 2 weeks. He states that the testosterone helps with his energy, libido, muscle mass. Lab Results  Component Value Date   TESTOSTERONE 526 05/24/2020     Current Medications:   Current Outpatient Medications (Endocrine & Metabolic):    testosterone cypionate (DEPOTESTOSTERONE CYPIONATE) 200 MG/ML injection, Inject  2 ml into Muscle  Every 14 days  Current Outpatient Medications (Cardiovascular):    bisoprolol (ZEBETA) 10 MG tablet, TAKE 1 TABLET DAILY   enalapril (VASOTEC) 20 MG tablet, TAKE 2 TABLETS DAILY FOR   BLOOD PRESSURE   rosuvastatin (CRESTOR) 20 MG tablet, Take 1 tab in the evening 3 days a week for cholesterol.     Current Outpatient Medications (Other):    ALPRAZolam (XANAX) 1 MG tablet, TAKE 1/2 TO 1 TABLET 2-3 TIMES DAILY ONLY IF NEEDED FOR ANXIETY ATTACK AND LIMIT TO 5 DAYS A WEEK TO AVOID ADDICTION OR DEMENTIA   cholecalciferol (VITAMIN D) 1000 UNITS tablet, Take 1,000 Units by mouth daily. Take 5000 IU daily.   Magnesium 400 MG CAPS, Take 1 capsule by mouth daily.   Omega-3 Fatty Acids (FISH OIL) 1000 MG CAPS, Take 2,000 mg by mouth daily.   omeprazole (PRILOSEC) 40 MG capsule, TAKE 1 CAPSULE DAILY TO    PREVENT INDIGESTION AND    HEARTBURN   sertraline (ZOLOFT) 50 MG tablet, TAKE 1/2 TO 1 TABLET BY MOUTH DAILY FOR MOOD  Allergies:  Allergies  Allergen Reactions   Citalopram Other (See Comments)    anxiety   Pravastatin    Trazodone And Nefazodone    Wellbutrin [Bupropion]    Health Maintenance:  Immunization History  Administered Date(s) Administered   HPV 9-valent 06/27/2017   Influenza Inj Mdck Quad With Preservative 11/08/2016   Influenza Whole 12/19/2011   Influenza, Seasonal, Injecte, Preservative Fre 02/02/2015   PPD Test 01/09/2013, 04/06/2015   Td 02/21/2004   Tdap 04/06/2015   Health Maintenance  Topic Date Due    Pneumococcal Vaccine 94-71 Years old (1 - PCV) Never done   INFLUENZA VACCINE  09/20/2020   TETANUS/TDAP  04/05/2025   Hepatitis C Screening  Completed   HIV Screening  Completed   HPV VACCINES  Aged Out    Tetanus: 2017 Pneumovax: N/A Prevnar 13: N/A Flu vaccine: 2016 Zostavax: N/A HPV: will start today  DEXA: Colonoscopy: N/A EGD: Eye Exam: Dentist:  Patient Care Team: Lucky Cowboy, MD as PCP - General (Internal Medicine) Lucky Cowboy, MD (Internal Medicine)  Medical History:  has Essential hypertension; Hyperlipidemia; Testosterone Deficiency; Vitamin D deficiency; Depression with anxiety; Overweight (BMI 25.0-29.9); Other abnormal glucose; Medication management; Esophageal reflux; and Sleep apnea on their problem list. Surgical History:  He  has no past surgical history on file. Family History:  His family history includes Cancer (age of onset: 96) in his father; Depression in his brother, mother, and sister; Fibromyalgia in his mother; Hypertension in his  brother and mother. Social History:   reports that he has never smoked. He has never used smokeless tobacco. He reports current alcohol use. He reports that he does not use drugs.  Review of Systems:  Review of Systems  Constitutional: Negative.  Negative for malaise/fatigue and weight loss.  HENT: Negative.  Negative for hearing loss and tinnitus.   Eyes: Negative.  Negative for blurred vision and double vision.  Respiratory: Negative.  Negative for cough, sputum production, shortness of breath and wheezing.   Cardiovascular: Negative.  Negative for chest pain, palpitations, orthopnea, claudication, leg swelling and PND.  Gastrointestinal: Negative.  Negative for abdominal pain, blood in stool, constipation, diarrhea, heartburn, melena, nausea and vomiting.  Genitourinary: Negative.   Musculoskeletal: Negative.  Negative for falls, joint pain and myalgias.  Skin:  Negative for rash (flushing on face with  wind/alcohol).  Neurological:  Negative for dizziness, tingling, sensory change, weakness and headaches.  Endo/Heme/Allergies:  Negative for polydipsia.  Psychiatric/Behavioral: Negative.  Negative for depression, memory loss, substance abuse and suicidal ideas. The patient is not nervous/anxious and does not have insomnia.   All other systems reviewed and are negative.  Physical Exam: Estimated body mass index is 28.58 kg/m as calculated from the following:   Height as of 05/24/20: 6\' 4"  (1.93 m).   Weight as of 05/24/20: 234 lb 12.8 oz (106.5 kg). There were no vitals taken for this visit. General Appearance: Well nourished, in no apparent distress.  Eyes: PERRLA, EOMs, conjunctiva no swelling or erythema, normal fundi and vessels.  Sinuses: No Frontal/maxillary tenderness  ENT/Mouth: Ext aud canals clear, normal light reflex with TMs without erythema, bulging. Good dentition. No erythema, swelling, or exudate on post pharynx. Tonsils not swollen or erythematous. Hearing normal.  Neck: Supple, thyroid normal. No bruits  Respiratory: Respiratory effort normal, BS equal bilaterally without rales, rhonchi, wheezing or stridor.  Cardio: RRR without murmurs, rubs or gallops. Brisk peripheral pulses without edema.  Chest: symmetric, with normal excursions and percussion.  Abdomen: Soft, nontender, no guarding, rebound, hernias, masses, or organomegaly.  Lymphatics: Non tender without lymphadenopathy.  Genitourinary: normal, no blood, non tender prostate Musculoskeletal: Full ROM all peripheral extremities,5/5 strength, and normal gait.  Skin: Warm, dry without rashes, lesions, ecchymosis. Neuro: Cranial nerves intact, reflexes equal bilaterally. Normal muscle tone, no cerebellar symptoms. Sensation intact.  Psych: Awake and oriented X 3, normal affect, Insight and Judgment appropriate.   EKG: WNL AORTA SCAN: defer  07/24/20 Ian Zavala 1:41 PM [facility]

## 2020-10-18 ENCOUNTER — Encounter: Payer: 59 | Admitting: Adult Health

## 2020-11-06 ENCOUNTER — Other Ambulatory Visit: Payer: Self-pay | Admitting: Adult Health

## 2020-11-06 DIAGNOSIS — I10 Essential (primary) hypertension: Secondary | ICD-10-CM

## 2020-12-05 ENCOUNTER — Other Ambulatory Visit: Payer: Self-pay | Admitting: Adult Health

## 2020-12-05 DIAGNOSIS — I10 Essential (primary) hypertension: Secondary | ICD-10-CM

## 2020-12-06 ENCOUNTER — Other Ambulatory Visit: Payer: Self-pay | Admitting: Adult Health

## 2020-12-06 DIAGNOSIS — I10 Essential (primary) hypertension: Secondary | ICD-10-CM

## 2020-12-07 ENCOUNTER — Other Ambulatory Visit: Payer: Self-pay | Admitting: Internal Medicine

## 2020-12-07 DIAGNOSIS — I10 Essential (primary) hypertension: Secondary | ICD-10-CM

## 2020-12-07 MED ORDER — BISOPROLOL FUMARATE 10 MG PO TABS: ORAL_TABLET | ORAL | 3 refills | Status: DC

## 2020-12-07 MED ORDER — ENALAPRIL MALEATE 20 MG PO TABS: ORAL_TABLET | ORAL | 3 refills | Status: DC

## 2020-12-16 ENCOUNTER — Other Ambulatory Visit: Payer: Self-pay | Admitting: Internal Medicine

## 2020-12-16 DIAGNOSIS — F418 Other specified anxiety disorders: Secondary | ICD-10-CM

## 2020-12-23 ENCOUNTER — Other Ambulatory Visit: Payer: Self-pay

## 2020-12-23 ENCOUNTER — Ambulatory Visit: Payer: 59 | Admitting: Adult Health

## 2020-12-23 ENCOUNTER — Encounter: Payer: Self-pay | Admitting: Adult Health

## 2020-12-23 VITALS — BP 146/100 | HR 70 | Temp 97.7°F | Wt 243.0 lb

## 2020-12-23 DIAGNOSIS — G4730 Sleep apnea, unspecified: Secondary | ICD-10-CM

## 2020-12-23 DIAGNOSIS — E782 Mixed hyperlipidemia: Secondary | ICD-10-CM

## 2020-12-23 DIAGNOSIS — R7309 Other abnormal glucose: Secondary | ICD-10-CM

## 2020-12-23 DIAGNOSIS — E663 Overweight: Secondary | ICD-10-CM

## 2020-12-23 DIAGNOSIS — E559 Vitamin D deficiency, unspecified: Secondary | ICD-10-CM

## 2020-12-23 DIAGNOSIS — I10 Essential (primary) hypertension: Secondary | ICD-10-CM

## 2020-12-23 DIAGNOSIS — N529 Male erectile dysfunction, unspecified: Secondary | ICD-10-CM

## 2020-12-23 DIAGNOSIS — F418 Other specified anxiety disorders: Secondary | ICD-10-CM

## 2020-12-23 DIAGNOSIS — Z79899 Other long term (current) drug therapy: Secondary | ICD-10-CM

## 2020-12-23 MED ORDER — HYDROCHLOROTHIAZIDE 25 MG PO TABS: ORAL_TABLET | ORAL | 0 refills | Status: DC

## 2020-12-23 NOTE — Patient Instructions (Signed)
Food Choices for Gastroesophageal Reflux Disease, Adult °When you have gastroesophageal reflux disease (GERD), the foods you eat and your eating habits are very important. Choosing the right foods can help ease the discomfort of GERD. Consider working with a dietitian to help you make healthy food choices. °What are tips for following this plan? °Reading food labels °Look for foods that are low in saturated fat. Foods that have less than 5% of daily value (DV) of fat and 0 g of trans fats may help with your symptoms. °Cooking °Cook foods using methods other than frying. This may include baking, steaming, grilling, or broiling. These are all methods that do not need a lot of fat for cooking. °To add flavor, try to use herbs that are low in spice and acidity. °Meal planning ° °Choose healthy foods that are low in fat, such as fruits, vegetables, whole grains, low-fat dairy products, lean meats, fish, and poultry. °Eat frequent, small meals instead of three large meals each day. Eat your meals slowly, in a relaxed setting. Avoid bending over or lying down until 2-3 hours after eating. °Limit high-fat foods such as fatty meats or fried foods. °Limit your intake of fatty foods, such as oils, butter, and shortening. °Avoid the following as told by your health care provider: °Foods that cause symptoms. These may be different for different people. Keep a food diary to keep track of foods that cause symptoms. °Alcohol. °Drinking large amounts of liquid with meals. °Eating meals during the 2-3 hours before bed. °Lifestyle °Maintain a healthy weight. Ask your health care provider what weight is healthy for you. If you need to lose weight, work with your health care provider to do so safely. °Exercise for at least 30 minutes on 5 or more days each week, or as told by your health care provider. °Avoid wearing clothes that fit tightly around your waist and chest. °Do not use any products that contain nicotine or tobacco. These  products include cigarettes, chewing tobacco, and vaping devices, such as e-cigarettes. If you need help quitting, ask your health care provider. °Sleep with the head of your bed raised. Use a wedge under the mattress or blocks under the bed frame to raise the head of the bed. °Chew sugar-free gum after mealtimes. °What foods should I eat? °Eat a healthy, well-balanced diet of fruits, vegetables, whole grains, low-fat dairy products, lean meats, fish, and poultry. Each person is different. Foods that may trigger symptoms in one person may not trigger any symptoms in another person. Work with your health care provider to identify foods that are safe for you. °The items listed above may not be a complete list of recommended foods and beverages. Contact a dietitian for more information. °What foods should I avoid? °Limiting some of these foods may help manage the symptoms of GERD. Everyone is different. Consult a dietitian or your health care provider to help you identify the exact foods to avoid, if any. °Fruits °Any fruits prepared with added fat. Any fruits that cause symptoms. For some people this may include citrus fruits, such as oranges, grapefruit, pineapple, and lemons. °Vegetables °Deep-fried vegetables. French fries. Any vegetables prepared with added fat. Any vegetables that cause symptoms. For some people, this may include tomatoes and tomato products, chili peppers, onions and garlic, and horseradish. °Grains °Pastries or quick breads with added fat. °Meats and other proteins °High-fat meats, such as fatty beef or pork, hot dogs, ribs, ham, sausage, salami, and bacon. Fried meat or protein, including fried   fish and fried chicken. Nuts and nut butters, in large amounts. °Dairy °Whole milk and chocolate milk. Sour cream. Cream. Ice cream. Cream cheese. Milkshakes. °Fats and oils °Butter. Margarine. Shortening. Ghee. °Beverages °Coffee and tea, with or without caffeine. Carbonated beverages. Sodas. Energy  drinks. Fruit juice made with acidic fruits, such as orange or grapefruit. Tomato juice. Alcoholic drinks. °Sweets and desserts °Chocolate and cocoa. Donuts. °Seasonings and condiments °Pepper. Peppermint and spearmint. Added salt. Any condiments, herbs, or seasonings that cause symptoms. For some people, this may include curry, hot sauce, or vinegar-based salad dressings. °The items listed above may not be a complete list of foods and beverages to avoid. Contact a dietitian for more information. °Questions to ask your health care provider °Diet and lifestyle changes are usually the first steps that are taken to manage symptoms of GERD. If diet and lifestyle changes do not improve your symptoms, talk with your health care provider about taking medicines. °Where to find more information °International Foundation for Gastrointestinal Disorders: aboutgerd.org °Summary °When you have gastroesophageal reflux disease (GERD), food and lifestyle choices may be very helpful in easing the discomfort of GERD. °Eat frequent, small meals instead of three large meals each day. Eat your meals slowly, in a relaxed setting. Avoid bending over or lying down until 2-3 hours after eating. °Limit high-fat foods such as fatty meats or fried foods. °This information is not intended to replace advice given to you by your health care provider. Make sure you discuss any questions you have with your health care provider. °Document Revised: 08/18/2019 Document Reviewed: 08/18/2019 °Elsevier Patient Education © 2022 [employer] Inc. ° °

## 2020-12-23 NOTE — Progress Notes (Signed)
Assessment and Plan:    Hypertension Continue bisoprolol, enalapril Add HCTZ and reduce sodium in diet Start monitoring blood pressure at home for goal <130/80 Reviewed low sodium/DASH diet.   Refer to ED if any CP, dyspnea.  - 4-6 week follow up  Cholesterol Rosuvastatin - 20 mg - taking irregularly due to GI upset May try zetia with low frequency rosuvastatin  Continue low cholesterol diet and exercise.  Check lipid panel.   Other abnormal glucose Recent A1Cs at goal Discussed diet/exercise, weight management  Defer A1C; check CMP  Vitamin D Def Continue supplementation Defer vitamin D level  Anxiety Continue sertraline  Disucssed CBT; he would consider Discussed xanax use, ideally to limit to <5 days per week stress management techniques discussed, increase water, good sleep hygiene discussed, increase exercise, and increase veggies.   Overweight Long discussion about weight loss, diet, and exercise Recommended diet heavy in fruits and veggies and low in animal meats, cheeses, and dairy products, appropriate calorie intake Patient will work on reducing junk food, pack lunch for work  Discussed appropriate weight for height  Follow up at next visit  Hypogonadism Continue replacement therapy, check testosterone levels as needed.    GERD ? controlled on current medications;  Discussed diet, avoiding triggers and other lifestyle changes  Irregular bowels/intermittent upper abdominal pain Benign exam today  Correlates with poor dietary habits, binge drinking alcohol Continue PPI Check CBC, CMP, consider [place] - has had LFT elevations Reduce alcohol, caffeine, spicy, excess fatty Keep food log Restart daily soluble fiber supplement  Plan GI referral for colonoscopy in the upcoming year - 4-6 week follow up with progress  Orders Placed This Encounter  Procedures   CBC with Differential/Platelet   COMPLETE METABOLIC PANEL WITH GFR   Magnesium   Lipid panel   TSH       Continue diet and meds as discussed. Further disposition pending results of labs. Over 30 minutes of exam, counseling, chart review, and critical decision making was performed No future appointments.    HPI 45 y.o. male  presents for 3 month follow up on hypertension, cholesterol, glucose management, hypogonadism, overweight and vitamin D deficiency.   He is working night shift at [employer], stressfull but has 25 years vested, trying to hold out untis retirement.   He is taking sertraline 50 mg daily for mood. He will take 1/2 xanax at night and occ during the day.  He is on CPAP for OSA.   GERD on omeprazole taking regularly, denies reflux, belching, coughing.   However he reports several months of irregular bowels, alternating slow soft stools and diarrhea, bristol 4-7. Has several BMs/day, notes sx worse with drinking lots of alcohol with junk food, also admits generally not eating well. Stopped a fiber supplement he was taking a while back. Also notes worse sx with spicy foods. Has had several months of dull upperRUQ discomfort intermittently after eating in the evening. Denies n/v/blood/mucus.   Admits up to 12 beers on the weekend; denies NSAIDs. Lots of caffeine.   BMI is Body mass index is 29.58 kg/m., he has been working on diet and exercise, not intentionally exercises, 5000 steps  Wt Readings from Last 3 Encounters:  12/23/20 243 lb (110.2 kg)  05/24/20 234 lb 12.8 oz (106.5 kg)  10/10/19 229 lb (103.9 kg)    He has BP cuff but hasn't been checking at home, today their BP is BP: (!) 146/100, similar on recheck. Taking bisoprolol 10 mg, enalapril 40 mg daily.  Admits eating lots of salt.   He does not workout He denies chest pain, shortness of breath, dizziness.   He is on cholesterol medication, WAS STOPPED DUE TO LIVER ENZYMES, has restarted and prescribed rosuvastatin 20 mg three days a week but reports GI upset after taking, only once a week or so. His cholesterol is  not at goal. He admits may not drink during the week but then 12 pack on the weekend.  The cholesterol last visit was:   Lab Results  Component Value Date   CHOL 213 (H) 05/24/2020   HDL 42 05/24/2020   LDLCALC 127 (H) 05/24/2020   TRIG 288 (H) 05/24/2020   CHOLHDL 5.1 (H) 05/24/2020   Last A1C in the office was:  Lab Results  Component Value Date   HGBA1C 5.0 05/24/2020   Patient is on Vitamin D supplement.   Lab Results  Component Value Date   VD25OH 48 05/24/2020   He has a history of testosterone deficiency and is on testosterone replacement, taking 400 mg every 2 weeks, due today.  He states that the testosterone helps with his energy, libido, muscle mass. Lab Results  Component Value Date   TESTOSTERONE 526 05/24/2020     Current Medications:  Current Outpatient Medications on File Prior to Visit  Medication Sig   ALPRAZolam (XANAX) 1 MG tablet TAKE 1/2 TO 1 TABLET 2-3 TIMES DAILY ONLY IF NEEDED FOR ANXIETY ATTACK AND LIMIT TO 5 DAYS A WEEK TO AVOID ADDICTION OR DEMENTIA   bisoprolol (ZEBETA) 10 MG tablet Take  1 tablet  Daily for BP                           /          Take by mouth   cholecalciferol (VITAMIN D) 1000 UNITS tablet Take 1,000 Units by mouth daily. Take 5000 IU daily.   enalapril (VASOTEC) 20 MG tablet Take  2 tablets  Daily for BP                      /          Take by mouth   Magnesium 400 MG CAPS Take 1 capsule by mouth daily.   Omega-3 Fatty Acids (FISH OIL) 1000 MG CAPS Take 2,000 mg by mouth daily.   omeprazole (PRILOSEC) 40 MG capsule TAKE 1 CAPSULE DAILY TO    PREVENT INDIGESTION AND    HEARTBURN   rosuvastatin (CRESTOR) 20 MG tablet Take 1 tab in the evening 3 days a week for cholesterol.   sertraline (ZOLOFT) 50 MG tablet TAKE 1/2 TO 1 TABLET BY MOUTH DAILY FOR MOOD   testosterone cypionate (DEPOTESTOSTERONE CYPIONATE) 200 MG/ML injection Inject  2 ml into Muscle  Every 14 days   No current facility-administered medications on file prior to  visit.    Medical History:  Past Medical History:  Diagnosis Date   Anxiety    Depression    GERD (gastroesophageal reflux disease)    Hyperlipidemia    Hypertension    Hypogonadism male    Obesity    OSA on CPAP 2019   Vitamin D deficiency    Allergies:  Allergies  Allergen Reactions   Citalopram Other (See Comments)    anxiety   Pravastatin    Trazodone And Nefazodone    Wellbutrin [Bupropion]      Review of Systems:  Review of Systems  Constitutional: Negative.  Negative for malaise/fatigue and weight loss.  HENT: Negative.  Negative for hearing loss and tinnitus.   Eyes: Negative.  Negative for blurred vision and double vision.  Respiratory: Negative.  Negative for cough, shortness of breath and wheezing.   Cardiovascular: Negative.  Negative for chest pain, palpitations, orthopnea, claudication and leg swelling.  Gastrointestinal:  Positive for abdominal pain and diarrhea. Negative for blood in stool, constipation, heartburn, melena, nausea and vomiting.  Genitourinary: Negative.   Musculoskeletal: Negative.  Negative for joint pain and myalgias.  Skin: Negative.  Negative for rash.  Neurological: Negative.  Negative for dizziness, tingling, sensory change, weakness and headaches.  Endo/Heme/Allergies: Negative.  Negative for polydipsia.  Psychiatric/Behavioral: Negative.    All other systems reviewed and are negative.  Family history- Review and unchanged Social history- Review and unchanged Physical Exam: BP (!) 146/100   Pulse 70   Temp 97.7 F (36.5 C)   Wt 243 lb (110.2 kg)   SpO2 99%   BMI 29.58 kg/m  Wt Readings from Last 3 Encounters:  12/23/20 243 lb (110.2 kg)  05/24/20 234 lb 12.8 oz (106.5 kg)  10/10/19 229 lb (103.9 kg)   General Appearance: Well nourished, in no apparent distress. Eyes: PERRLA, EOMs, conjunctiva no swelling or erythema Sinuses: No Frontal/maxillary tenderness ENT/Mouth: Ext aud canals clear, TMs without erythema,  bulging. No erythema, swelling, or exudate on post pharynx.  Tonsils not swollen or erythematous. Hearing normal.  Neck: Supple, thyroid normal.  Respiratory: Respiratory effort normal, BS equal bilaterally without rales, rhonchi, wheezing or stridor.  Cardio: RRR with no MRGs. Brisk peripheral pulses without edema.  Abdomen: Soft, + BS,  Non tender, no guarding, rebound, hernias, masses. Lymphatics: Non tender without lymphadenopathy.  Musculoskeletal: Full ROM, 5/5 strength, Normal gait Skin: Warm, dry without rashes, lesions, ecchymosis.  Neuro: Cranial nerves intact. Normal muscle tone, no cerebellar symptoms. Psych: Awake and oriented X 3, normal affect, Insight and Judgment appropriate.    Dan Maker, NP 5:47 PM [facility]

## 2020-12-24 ENCOUNTER — Other Ambulatory Visit: Payer: Self-pay | Admitting: Adult Health

## 2020-12-24 DIAGNOSIS — R7989 Other specified abnormal findings of blood chemistry: Secondary | ICD-10-CM | POA: Insufficient documentation

## 2020-12-24 DIAGNOSIS — R1010 Upper abdominal pain, unspecified: Secondary | ICD-10-CM | POA: Insufficient documentation

## 2020-12-24 LAB — COMPLETE METABOLIC PANEL WITH GFR
AG Ratio: 1.8 (calc) (ref 1.0–2.5)
ALT: 119 U/L — ABNORMAL HIGH (ref 9–46)
AST: 93 U/L — ABNORMAL HIGH (ref 10–40)
Albumin: 4.4 g/dL (ref 3.6–5.1)
Alkaline phosphatase (APISO): 47 U/L (ref 36–130)
BUN: 11 mg/dL (ref 7–25)
CO2: 29 mmol/L (ref 20–32)
Calcium: 9.5 mg/dL (ref 8.6–10.3)
Chloride: 102 mmol/L (ref 98–110)
Creat: 1.01 mg/dL (ref 0.60–1.29)
Globulin: 2.5 g/dL (calc) (ref 1.9–3.7)
Glucose, Bld: 86 mg/dL (ref 65–99)
Potassium: 4.3 mmol/L (ref 3.5–5.3)
Sodium: 140 mmol/L (ref 135–146)
Total Bilirubin: 1.2 mg/dL (ref 0.2–1.2)
Total Protein: 6.9 g/dL (ref 6.1–8.1)
eGFR: 93 mL/min/{1.73_m2} (ref >=60)

## 2020-12-24 LAB — CBC WITH DIFFERENTIAL/PLATELET
Absolute Monocytes: 354 cells/uL (ref 200–950)
Basophils Absolute: 21 cells/uL (ref 0–200)
Basophils Relative: 0.4 %
Eosinophils Absolute: 52 cells/uL (ref 15–500)
Eosinophils Relative: 1 %
HCT: 44.9 % (ref 38.5–50.0)
Hemoglobin: 15.3 g/dL (ref 13.2–17.1)
Lymphs Abs: 1123 cells/uL (ref 850–3900)
MCH: 33.4 pg — ABNORMAL HIGH (ref 27.0–33.0)
MCHC: 34.1 g/dL (ref 32.0–36.0)
MCV: 98 fL (ref 80.0–100.0)
MPV: 11.1 fL (ref 7.5–12.5)
Monocytes Relative: 6.8 %
Neutro Abs: 3650 cells/uL (ref 1500–7800)
Neutrophils Relative %: 70.2 %
Platelets: 177 10*3/uL (ref 140–400)
RBC: 4.58 10*6/uL (ref 4.20–5.80)
RDW: 11.9 % (ref 11.0–15.0)
Total Lymphocyte: 21.6 %
WBC: 5.2 10*3/uL (ref 3.8–10.8)

## 2020-12-24 LAB — LIPID PANEL
Cholesterol: 204 mg/dL — ABNORMAL HIGH (ref <200)
HDL: 41 mg/dL (ref >=40)
LDL Cholesterol (Calc): 126 mg/dL (calc) — ABNORMAL HIGH
Non-HDL Cholesterol (Calc): 163 mg/dL (calc) — ABNORMAL HIGH (ref <130)
Total CHOL/HDL Ratio: 5 (calc) — ABNORMAL HIGH (ref <5.0)
Triglycerides: 230 mg/dL — ABNORMAL HIGH (ref <150)

## 2020-12-24 LAB — TSH: TSH: 2.29 mIU/L (ref 0.40–4.50)

## 2020-12-24 LAB — MAGNESIUM: Magnesium: 2.3 mg/dL (ref 1.5–2.5)

## 2021-01-10 ENCOUNTER — Ambulatory Visit
Admission: RE | Admit: 2021-01-10 | Discharge: 2021-01-10 | Disposition: A | Discharge status: Home / Self Care | Payer: Self-pay | Source: Ambulatory Visit | Attending: Adult Health | Admitting: Adult Health

## 2021-01-10 DIAGNOSIS — R7989 Other specified abnormal findings of blood chemistry: Secondary | ICD-10-CM

## 2021-01-10 DIAGNOSIS — R1010 Upper abdominal pain, unspecified: Secondary | ICD-10-CM

## 2021-02-04 ENCOUNTER — Other Ambulatory Visit: Payer: Self-pay | Admitting: Adult Health

## 2021-02-15 ENCOUNTER — Ambulatory Visit: Payer: 59 | Admitting: Adult Health

## 2021-03-04 ENCOUNTER — Other Ambulatory Visit: Payer: Self-pay | Admitting: Internal Medicine

## 2021-03-04 DIAGNOSIS — E291 Testicular hypofunction: Secondary | ICD-10-CM

## 2021-03-05 ENCOUNTER — Other Ambulatory Visit: Payer: Self-pay | Admitting: Adult Health

## 2021-03-11 NOTE — Progress Notes (Deleted)
Assessment and Plan:  1. Elevated LFTs ***  2. Upper abdominal pain ***     Further disposition pending results of labs. Discussed med's effects and SE's.   Over 30 minutes of exam, counseling, chart review, and critical decision making was performed.   Future Appointments  Date Time Provider Department Center  03/15/2021 11:30 AM Judd Gaudier, NP [facility] None    ------------------------------------------------------------------------------------------------------------------   HPI There were no vitals taken for this visit. 46 y.o.male presents for 6 week follow up on elevated LFTs, upper GI pain.   He is working night shift at [employer], stressfull but has 25 years vested, trying to hold out untis retirement.  He is taking sertraline 50 mg daily for mood. He will take 1/2 xanax at night and occ during the day.    GERD on omeprazole taking regularly, denies reflux, belching, coughing.   However he reported several months of irregular bowels, alternating slow soft stools and diarrhea, bristol 4-7. Has several BMs/day, notes sx worse with drinking lots of alcohol with junk food, also admits generally not eating well. Stopped a fiber supplement he was taking a while back. Also notes worse sx with spicy foods. Has had several months of dull upperRUQ discomfort intermittently after eating in the evening. Denies n/v/blood/mucus.   Admits up to 12 beers on the weekend; denies NSAIDs. Lots of caffeine.   He had elevated LFTs at last OV:  Lab Results  Component Value Date   ALT 119 (H) 12/23/2020   AST 93 (H) 12/23/2020   ALKPHOS 46 06/26/2016   BILITOT 1.2 12/23/2020    [place] was ordered showing:  1. No acute sonographic findings within the abdomen. 2. Echogenic liver. Findings commonly seen in hepatic steatosis, the mouth or represent hepatitis and/or fibrosis.   Family History:  Hisfamily history includes Cancer (age of onset: 50) in his father; Depression in his brother,  mother, and sister; Fibromyalgia in his mother; Hypertension in his brother and mother. Social History:   reports that he has never smoked. He has never used smokeless tobacco. He reports current alcohol use. He reports that he does not use drugs.   Past Medical History:  Diagnosis Date   Anxiety    Depression    GERD (gastroesophageal reflux disease)    Hyperlipidemia    Hypertension    Hypogonadism male    Obesity    OSA on CPAP 2019   Vitamin D deficiency      Allergies  Allergen Reactions   Citalopram Other (See Comments)    anxiety   Pravastatin    Trazodone And Nefazodone    Wellbutrin [Bupropion]     Current Outpatient Medications on File Prior to Visit  Medication Sig   ALPRAZolam (XANAX) 1 MG tablet TAKE 1/2 TO 1 TABLET 2-3 TIMES DAILY ONLY IF NEEDED FOR ANXIETY ATTACK AND LIMIT TO 5 DAYS A WEEK TO AVOID ADDICTION OR DEMENTIA   bisoprolol (ZEBETA) 10 MG tablet Take  1 tablet  Daily for BP                           /          Take by mouth   cholecalciferol (VITAMIN D) 1000 UNITS tablet Take 1,000 Units by mouth daily. Take 5000 IU daily.   enalapril (VASOTEC) 20 MG tablet Take  2 tablets  Daily for BP                      /  Take by mouth   hydrochlorothiazide (HYDRODIURIL) 25 MG tablet Take 1 tablet every Morning for BP & Fluid Retention   Magnesium 400 MG CAPS Take 1 capsule by mouth daily.   Omega-3 Fatty Acids (FISH OIL) 1000 MG CAPS Take 2,000 mg by mouth daily.   omeprazole (PRILOSEC) 40 MG capsule TAKE 1 CAPSULE DAILY TO    PREVENT INDIGESTION AND    HEARTBURN   rosuvastatin (CRESTOR) 20 MG tablet Take 1 tab in the evening 3 days a week for cholesterol.   sertraline (ZOLOFT) 50 MG tablet TAKE 1/2 TO 1 TABLET BY MOUTH DAILY FOR MOOD   testosterone cypionate (DEPOTESTOSTERONE CYPIONATE) 200 MG/ML injection INJECT INTRAMUSCULARLY EVERY 14 DAYS, DISCARD THE REMAINDER   No current facility-administered medications on file prior to visit.    ROS: all  negative except above.   Physical Exam:  There were no vitals taken for this visit.  General Appearance: Well nourished, in no apparent distress. Eyes: PERRLA, EOMs, conjunctiva no swelling or erythema Sinuses: No Frontal/maxillary tenderness ENT/Mouth: Ext aud canals clear, TMs without erythema, bulging. No erythema, swelling, or exudate on post pharynx.  Tonsils not swollen or erythematous. Hearing normal.  Neck: Supple, thyroid normal.  Respiratory: Respiratory effort normal, BS equal bilaterally without rales, rhonchi, wheezing or stridor.  Cardio: RRR with no MRGs. Brisk peripheral pulses without edema.  Abdomen: Soft, + BS.  Non tender, no guarding, rebound, hernias, masses. Lymphatics: Non tender without lymphadenopathy.  Musculoskeletal: Full ROM, 5/5 strength, normal gait.  Skin: Warm, dry without rashes, lesions, ecchymosis.  Neuro: Cranial nerves intact. Normal muscle tone, no cerebellar symptoms. Sensation intact.  Psych: Awake and oriented X 3, normal affect, Insight and Judgment appropriate.     Dan Maker, NP 2:11 PM [facility]

## 2021-03-15 ENCOUNTER — Ambulatory Visit: Payer: 59 | Admitting: Adult Health

## 2021-03-15 DIAGNOSIS — K759 Inflammatory liver disease, unspecified: Secondary | ICD-10-CM

## 2021-03-15 DIAGNOSIS — R1010 Upper abdominal pain, unspecified: Secondary | ICD-10-CM

## 2021-03-15 DIAGNOSIS — R7989 Other specified abnormal findings of blood chemistry: Secondary | ICD-10-CM

## 2021-03-18 NOTE — Progress Notes (Deleted)
Assessment and Plan:  1. Elevated LFTs ***  2. Upper abdominal pain ***     Further disposition pending results of labs. Discussed med's effects and SE's.   Over 30 minutes of exam, counseling, chart review, and critical decision making was performed.   Future Appointments  Date Time Provider Department Center  03/22/2021 11:30 AM Judd Gaudier, NP [facility] None    ------------------------------------------------------------------------------------------------------------------   HPI There were no vitals taken for this visit. 46 y.o.male presents for 6 week follow up on elevated LFTs, upper GI pain, htn.   He is working night shift at [employer], stressfull but has 25 years vested, trying to hold out untis retirement.  He is taking sertraline 50 mg daily for mood. He will take 1/2 xanax at night and occ during the day.    GERD on omeprazole taking regularly, denies reflux, belching, coughing.   However he reported several months of irregular bowels, alternating slow soft stools and diarrhea, bristol 4-7. Has several BMs/day, notes sx worse with drinking lots of alcohol with junk food, also admited generally not eating well. Stopped a fiber supplement he was taking a while back. Also notes worse sx with spicy foods. Has had several months of dull upper RUQ discomfort intermittently after eating in the evening. Denies n/v/blood/mucus.   Admits up to 12 beers on the weekend; denies NSAIDs. Lots of caffeine.   He had elevated LFTs at last OV:  Lab Results  Component Value Date   ALT 119 (H) 12/23/2020   AST 93 (H) 12/23/2020   ALKPHOS 46 06/26/2016   BILITOT 1.2 12/23/2020   [place] was ordered showing:  1. No acute sonographic findings within the abdomen. 2. Echogenic liver. Findings commonly seen in hepatic steatosis, the mouth or represent hepatitis and/or fibrosis.   Family History:  Hisfamily history includes Cancer (age of onset: 32) in his father; Depression in his  brother, mother, and sister; Fibromyalgia in his mother; Hypertension in his brother and mother. Social History:   reports that he has never smoked. He has never used smokeless tobacco. He reports current alcohol use. He reports that he does not use drugs.   Past Medical History:  Diagnosis Date   Anxiety    Depression    GERD (gastroesophageal reflux disease)    Hyperlipidemia    Hypertension    Hypogonadism male    Obesity    OSA on CPAP 2019   Vitamin D deficiency      Allergies  Allergen Reactions   Citalopram Other (See Comments)    anxiety   Pravastatin    Trazodone And Nefazodone    Wellbutrin [Bupropion]     Current Outpatient Medications on File Prior to Visit  Medication Sig   ALPRAZolam (XANAX) 1 MG tablet TAKE 1/2 TO 1 TABLET 2-3 TIMES DAILY ONLY IF NEEDED FOR ANXIETY ATTACK AND LIMIT TO 5 DAYS A WEEK TO AVOID ADDICTION OR DEMENTIA   bisoprolol (ZEBETA) 10 MG tablet Take  1 tablet  Daily for BP                           /          Take by mouth   cholecalciferol (VITAMIN D) 1000 UNITS tablet Take 1,000 Units by mouth daily. Take 5000 IU daily.   enalapril (VASOTEC) 20 MG tablet Take  2 tablets  Daily for BP                      /  Take by mouth   hydrochlorothiazide (HYDRODIURIL) 25 MG tablet Take 1 tablet every Morning for BP & Fluid Retention   Magnesium 400 MG CAPS Take 1 capsule by mouth daily.   Omega-3 Fatty Acids (FISH OIL) 1000 MG CAPS Take 2,000 mg by mouth daily.   omeprazole (PRILOSEC) 40 MG capsule TAKE 1 CAPSULE DAILY TO    PREVENT INDIGESTION AND    HEARTBURN   rosuvastatin (CRESTOR) 20 MG tablet Take 1 tab in the evening 3 days a week for cholesterol.   sertraline (ZOLOFT) 50 MG tablet TAKE 1/2 TO 1 TABLET BY MOUTH DAILY FOR MOOD   testosterone cypionate (DEPOTESTOSTERONE CYPIONATE) 200 MG/ML injection INJECT INTRAMUSCULARLY EVERY 14 DAYS, DISCARD THE REMAINDER   No current facility-administered medications on file prior to visit.     ROS: all negative except above.   Physical Exam:  There were no vitals taken for this visit.  General Appearance: Well nourished, in no apparent distress. Eyes: PERRLA, EOMs, conjunctiva no swelling or erythema Sinuses: No Frontal/maxillary tenderness ENT/Mouth: Ext aud canals clear, TMs without erythema, bulging. No erythema, swelling, or exudate on post pharynx.  Tonsils not swollen or erythematous. Hearing normal.  Neck: Supple, thyroid normal.  Respiratory: Respiratory effort normal, BS equal bilaterally without rales, rhonchi, wheezing or stridor.  Cardio: RRR with no MRGs. Brisk peripheral pulses without edema.  Abdomen: Soft, + BS.  Non tender, no guarding, rebound, hernias, masses. Lymphatics: Non tender without lymphadenopathy.  Musculoskeletal: Full ROM, 5/5 strength, normal gait.  Skin: Warm, dry without rashes, lesions, ecchymosis.  Neuro: Cranial nerves intact. Normal muscle tone, no cerebellar symptoms. Sensation intact.  Psych: Awake and oriented X 3, normal affect, Insight and Judgment appropriate.     Dan Maker, NP 5:10 PM [facility]

## 2021-03-22 ENCOUNTER — Ambulatory Visit: Payer: 59 | Admitting: Adult Health

## 2021-03-22 DIAGNOSIS — I10 Essential (primary) hypertension: Secondary | ICD-10-CM

## 2021-03-22 DIAGNOSIS — R7989 Other specified abnormal findings of blood chemistry: Secondary | ICD-10-CM

## 2021-03-22 DIAGNOSIS — K759 Inflammatory liver disease, unspecified: Secondary | ICD-10-CM

## 2021-03-25 ENCOUNTER — Other Ambulatory Visit: Payer: Self-pay | Admitting: Adult Health

## 2021-03-25 DIAGNOSIS — F418 Other specified anxiety disorders: Secondary | ICD-10-CM

## 2021-03-30 ENCOUNTER — Encounter: Payer: Self-pay | Admitting: Internal Medicine

## 2021-03-30 ENCOUNTER — Other Ambulatory Visit: Payer: Self-pay | Admitting: Internal Medicine

## 2021-03-30 MED ORDER — ROSUVASTATIN CALCIUM 20 MG PO TABS: ORAL_TABLET | ORAL | 1 refills | Status: DC

## 2021-04-01 NOTE — Progress Notes (Unsigned)
Assessment and Plan:  Diagnoses and all orders for this visit:  Essential hypertension  Elevated LFTs  Medication management  Hepatitis     Further disposition pending results of labs. Discussed med's effects and SE's.   Over 30 minutes of exam, counseling, chart review, and critical decision making was performed.   Future Appointments  Date Time Provider [place]  04/05/2021 11:30 AM Liane Comber, NP [facility] None    ------------------------------------------------------------------------------------------------------------------   HPI There were no vitals taken for this visit. 46 y.o.male presents for 6 week follow up on elevated LFTs, upper GI pain, htn.   He is working night shift at [employer], [place] but has 25 years vested, trying to hold out untis retirement.  He is taking sertraline 50 mg daily for mood. He will take 1/2 xanax at night and occ during the day.    GERD on omeprazole taking regularly, denies reflux, belching, coughing.   However he reported several months of irregular bowels, alternating slow soft stools and diarrhea, bristol 4-7. Has several BMs/day, notes sx worse with drinking lots of alcohol with junk food, also admited generally not eating well. Stopped a fiber supplement he was taking a while back. Also notes worse sx with spicy foods. Has had several months of dull upper RUQ discomfort intermittently after eating in the evening. Denies n/v/blood/mucus.   Admits up to 12 beers on the weekend; denies NSAIDs. Lots of caffeine.   He had elevated LFTs at last OV:  Lab Results  Component Value Date   ALT 119 (H) 12/23/2020   AST 93 (H) 12/23/2020   ALKPHOS 46 06/26/2016   BILITOT 1.2 12/23/2020   [place] was ordered showing:  1. No acute sonographic findings within the abdomen. 2. Echogenic liver. Findings commonly seen in hepatic steatosis, the mouth or represent hepatitis and/or fibrosis.   Family History:  Hisfamily history  includes Cancer (age of onset: 59) in his father; Depression in his brother, mother, and sister; Fibromyalgia in his mother; Hypertension in his brother and mother. Social History:   reports that he has never smoked. He has never used smokeless tobacco. He reports current alcohol use. He reports that he does not use drugs.   Past Medical History:  Diagnosis Date   Anxiety    Depression    GERD (gastroesophageal reflux disease)    Hyperlipidemia    Hypertension    Hypogonadism male    Obesity    OSA on CPAP 2019   Vitamin D deficiency      Allergies  Allergen Reactions   Citalopram Other (See Comments)    anxiety   Pravastatin    Trazodone And Nefazodone    Wellbutrin [Bupropion]     Current Outpatient Medications on File Prior to Visit  Medication Sig   ALPRAZolam (XANAX) 1 MG tablet TAKE 1/2 TO 1 TABLET 2-3 TIMES DAILY ONLY IF NEEDED FOR ANXIETY ATTACK AND LIMIT TO 5 DAYS A WEEK TO AVOID ADDICTION OR DEMENTIA   bisoprolol (ZEBETA) 10 MG tablet Take  1 tablet  Daily for BP                           /          Take by mouth   cholecalciferol (VITAMIN D) 1000 UNITS tablet Take 1,000 Units by mouth daily. Take 5000 IU daily.   enalapril (VASOTEC) 20 MG tablet Take  2 tablets  Daily for BP                      /  Take by mouth   hydrochlorothiazide (HYDRODIURIL) 25 MG tablet Take 1 tablet every Morning for BP & Fluid Retention   Magnesium 400 MG CAPS Take 1 capsule by mouth daily.   Omega-3 Fatty Acids (FISH OIL) 1000 MG CAPS Take 2,000 mg by mouth daily.   omeprazole (PRILOSEC) 40 MG capsule TAKE 1 CAPSULE DAILY TO    PREVENT INDIGESTION AND    HEARTBURN   rosuvastatin (CRESTOR) 20 MG tablet Take  1 tab  3 days / week  for Cholesterol.   sertraline (ZOLOFT) 50 MG tablet TAKE 1/2 TO 1 TABLET BY MOUTH DAILY FOR MOOD   testosterone cypionate (DEPOTESTOSTERONE CYPIONATE) 200 MG/ML injection INJECT 2ML INTRAMUSCULARLY EVERY 14 DAYS, DISCARD THE REMAINDER   No current  facility-administered medications on file prior to visit.    ROS: all negative except above.   Physical Exam:  There were no vitals taken for this visit.  General Appearance: Well nourished, in no apparent distress. Eyes: PERRLA, EOMs, conjunctiva no swelling or erythema Sinuses: No Frontal/maxillary tenderness ENT/Mouth: Ext aud canals clear, TMs without erythema, bulging. No erythema, swelling, or exudate on post pharynx.  Tonsils not swollen or erythematous. Hearing normal.  Neck: Supple, thyroid normal.  Respiratory: Respiratory effort normal, BS equal bilaterally without rales, rhonchi, wheezing or stridor.  Cardio: RRR with no MRGs. Brisk peripheral pulses without edema.  Abdomen: Soft, + BS.  Non tender, no guarding, rebound, hernias, masses. Lymphatics: Non tender without lymphadenopathy.  Musculoskeletal: Full ROM, 5/5 strength, normal gait.  Skin: Warm, dry without rashes, lesions, ecchymosis.  Neuro: Cranial nerves intact. Normal muscle tone, no cerebellar symptoms. Sensation intact.  Psych: Awake and oriented X 3, normal affect, Insight and Judgment appropriate.     Izora Ribas, NP 3:26 PM [facility]

## 2021-04-05 ENCOUNTER — Ambulatory Visit: Payer: 59 | Admitting: Adult Health

## 2021-04-05 DIAGNOSIS — K759 Inflammatory liver disease, unspecified: Secondary | ICD-10-CM

## 2021-04-05 DIAGNOSIS — Z79899 Other long term (current) drug therapy: Secondary | ICD-10-CM

## 2021-04-05 DIAGNOSIS — R7989 Other specified abnormal findings of blood chemistry: Secondary | ICD-10-CM

## 2021-04-05 DIAGNOSIS — I10 Essential (primary) hypertension: Secondary | ICD-10-CM

## 2021-06-12 ENCOUNTER — Other Ambulatory Visit: Payer: Self-pay | Admitting: Adult Health

## 2021-06-12 DIAGNOSIS — F418 Other specified anxiety disorders: Secondary | ICD-10-CM

## 2021-06-12 DIAGNOSIS — E291 Testicular hypofunction: Secondary | ICD-10-CM

## 2021-06-14 ENCOUNTER — Other Ambulatory Visit: Payer: Self-pay | Admitting: Internal Medicine

## 2021-06-14 ENCOUNTER — Encounter: Payer: Self-pay | Admitting: Internal Medicine

## 2021-06-14 DIAGNOSIS — E291 Testicular hypofunction: Secondary | ICD-10-CM

## 2021-06-14 DIAGNOSIS — F418 Other specified anxiety disorders: Secondary | ICD-10-CM

## 2021-06-14 MED ORDER — SERTRALINE HCL 100 MG PO TABS: ORAL_TABLET | ORAL | 0 refills | Status: DC

## 2021-06-14 MED ORDER — TESTOSTERONE CYPIONATE 200 MG/ML IM SOLN: INTRAMUSCULAR | 0 refills | Status: DC

## 2021-06-14 MED ORDER — ALPRAZOLAM 0.5 MG PO TABS: ORAL_TABLET | ORAL | 0 refills | Status: DC

## 2021-07-19 ENCOUNTER — Encounter: Payer: Self-pay | Admitting: Internal Medicine

## 2021-07-19 NOTE — Progress Notes (Unsigned)
No future appointments.    History of Present Illness:       This very nice 46 y.o. MWM presents for 6 month follow up with HTN, HLD, Pre-Diabetes and Vitamin D Deficiency.        Patient had been on Trintellix reporting increased agitation and sense of dysphoria when Trazodone added. 20+ years ago he had taken Zoloft for Depression/Dysthymia and desires to retry Zoloft.        Patient is treated for HTN since age in 542000  & BP has been controlled at home. Today's BP is at goal - 126/86. Patient has had no complaints of any cardiac type chest pain, palpitations, dyspnea / orthopnea / PND, dizziness, claudication, or dependent edema.       Hyperlipidemia is controlled with diet & Rosuvastatin 20 mg 3 x /week. Patient report flu-like myalgias  For 24-36 hrs after taking Rosuvastatin  or other med SE's. Last Lipids were not at goal:   Lab Results  Component Value Date   CHOL 204 (H) 12/23/2020   HDL 41 12/23/2020   LDLCALC 126 (H) 12/23/2020   TRIG 230 (H) 12/23/2020   CHOLHDL 5.0 (H) 12/23/2020    Also, the patient has history of PreDiabetes (A1c 5.8%/2015) and has had no symptoms of reactive hypoglycemia, diabetic polys, paresthesias or visual blurring.  Last A1c was normal & at goal:  Lab Results  Component Value Date   HGBA1C 5.0 05/24/2020         Patient has hx/o Testosterone deficiency ("245" in 2012 ) and has been on Testosterone Replacement since.         Further, the patient also has history of Vitamin D Deficiency ("25" /2008) and supplements vitamin D without any suspected side-effects. Last vitamin D was low (goal 70-100):  Lab Results  Component Value Date   VD25OH 48 05/24/2020       Current Outpatient Medications on File Prior to Visit  Medication Sig   ALPRAZolam 1 MG tablet TAKE 1/2 - 1 TAB 2-3 x DAILY ONLY IF NEEDED    bisoprolol  10 MG tablet Take 1 tablet  daily.   VITAMIN D 5000 IU  Take daily.   enalapril 20 MG tablet TAKE 2 TABLETS  DAILY FOR   BLOOD PRESSURE   EPIDUO 0.1-2.5 % gel for Acne   Magnesium 400 MG CAPS Take 1 capsuledaily.   Omega-3 FISH OIL 2,000 mg Take   daily.   omeprazole  MG capsule Take  1 capsule Daily    rosuvastatin 20 MG tablet Take 1 tab in the evening 3 days a week    testosterone cypio 200 MG/ML injec Inject 2 ml into muscle every 14 days    Allergies  Allergen Reactions   Citalopram Other (See Comments)    anxiety   Pravastatin    Trazodone And Nefazodone    Wellbutrin [Bupropion]    Serotonin Reuptake Inhibitors (SSRI's) Anxiety    Serotonin syndrome with trintellix and trazodone- avoid combinations that can increase risk    PMHx:   Past Medical History:  Diagnosis Date   Anxiety    Depression    GERD (gastroesophageal reflux disease)    Hyperlipidemia    Hypertension    Hypogonadism male    Obesity    OSA on CPAP 2019   Vitamin D deficiency     Immunization History  Administered Date(s) Administered   HPV 9-valent 06/27/2017   Influenza Inj Mdck Quad With Preservative 11/08/2016  Influenza Whole 12/19/2011   Influenza, Seasonal, Injecte, Preservative Fre 02/02/2015   PPD Test 01/09/2013, 04/06/2015   Td 02/21/2004   Tdap 04/06/2015    No past surgical history on file.  FHx:    Reviewed / unchanged  SHx:    Reviewed / unchanged   Systems Review:  Constitutional: Denies fever, chills, wt changes, headaches, insomnia, fatigue, night sweats, change in appetite. Eyes: Denies redness, blurred vision, diplopia, discharge, itchy, watery eyes.  ENT: Denies discharge, congestion, post nasal drip, epistaxis, sore throat, earache, hearing loss, dental pain, tinnitus, vertigo, sinus pain, snoring.  CV: Denies chest pain, palpitations, irregular heartbeat, syncope, dyspnea, diaphoresis, orthopnea, PND, claudication or edema. Respiratory: denies cough, dyspnea, DOE, pleurisy, hoarseness, laryngitis, wheezing.  Gastrointestinal: Denies dysphagia, odynophagia, heartburn,  reflux, water brash, abdominal pain or cramps, nausea, vomiting, bloating, diarrhea, constipation, hematemesis, melena, hematochezia  or hemorrhoids. Genitourinary: Denies dysuria, frequency, urgency, nocturia, hesitancy, discharge, hematuria or flank pain. Musculoskeletal: Denies arthralgias, myalgias, stiffness, jt. swelling, pain, limping or strain/sprain.  Skin: Denies pruritus, rash, hives, warts, acne, eczema or change in skin lesion(s). Neuro: No weakness, tremor, incoordination, spasms, paresthesia or pain. Psychiatric: Denies confusion, memory loss or sensory loss. Endo: Denies change in weight, skin or hair change.  Heme/Lymph: No excessive bleeding, bruising or enlarged lymph nodes.  Physical Exam  BP (!) 141/93   Pulse 70   Temp (!) 96.9 F (36.1 C)   Resp 16   Ht 6\' 4"  (1.93 m)   Wt 240 lb (108.9 kg)   SpO2 98%   BMI 29.21 kg/m   Appears  well nourished, well groomed  and in no distress.  Eyes: PERRLA, EOMs, conjunctiva no swelling or erythema. Sinuses: No frontal/maxillary tenderness ENT/Mouth: EAC's clear, TM's nl w/o erythema, bulging. Nares clear w/o erythema, swelling, exudates. Oropharynx clear without erythema or exudates. Oral hygiene is good. Tongue normal, non obstructing. Hearing intact.  Neck: Supple. Thyroid not palpable. Car 2+/2+ without bruits, nodes or JVD. Chest: Respirations nl with BS clear & equal w/o rales, rhonchi, wheezing or stridor.  Cor: Heart sounds normal w/ regular rate and rhythm without sig. murmurs, gallops, clicks or rubs. Peripheral pulses normal and equal  without edema.  Abdomen: Soft & bowel sounds normal. Non-tender w/o guarding, rebound, hernias, masses or organomegaly.  Lymphatics: Unremarkable.  Musculoskeletal: Full ROM all peripheral extremities, joint stability, 5/5 strength and normal gait.  Skin: Warm, dry without exposed rashes, lesions or ecchymosis apparent.  Neuro: Cranial nerves intact, reflexes equal bilaterally.  Sensory-motor testing grossly intact. Tendon reflexes grossly intact.  Pysch: Alert & oriented x 3.  Insight and judgement nl & appropriate. No ideations.  Assessment and Plan:  1. Essential hypertension  - Continue medication, monitor blood pressure at home.  - Continue DASH diet.  Reminder to go to the ER if any CP,  SOB, nausea, dizziness, severe HA, changes vision/speech.  - CBC with Differential/Platelet - COMPLETE METABOLIC PANEL WITH GFR - Magnesium - TSH  2. Hyperlipidemia, mixed  - Continue diet/meds, exercise,& lifestyle modifications.  - Continue monitor periodic cholesterol/liver & renal functions   - Lipid panel - TSH  3. Abnormal glucose  - Continue diet, exercise  - Lifestyle modifications.  - Monitor appropriate labs  - Hemoglobin A1c - Insulin, random  4. Vitamin D deficiency  - Continue supplementation.  - VITAMIN D 25 Hydroxy  5. Testosterone Deficiency  - Testosterone  6. Medication management  - CBC with Differential/Platelet - COMPLETE METABOLIC PANEL WITH GFR - Magnesium -  Lipid panel - TSH - Hemoglobin A1c - Insulin, random - VITAMIN D 25 Hydroxy - Testosterone       Discussed  regular exercise, BP monitoring, weight control to achieve/maintain BMI less than 25 and discussed med and SE's. Recommended labs to assess and monitor clinical status with further disposition pending results of labs.  Will restart Zoloft and patient advised if develops side-effects to cut dose in 1/2 . I discussed the assessment and treatment plan with the patient. The patient was provided an opportunity to ask questions and all were answered. The patient agreed with the plan and demonstrated an understanding of the instructions.  I provided over 30 minutes of exam, counseling, chart review and  complex critical decision making.       The patient was advised to call back or seek an in-person evaluation if the symptoms worsen or if the condition fails to improve  as anticipated.   Marinus Maw, MD

## 2021-07-19 NOTE — Patient Instructions (Signed)

## 2021-07-20 ENCOUNTER — Ambulatory Visit: Payer: 59 | Admitting: Internal Medicine

## 2021-07-20 ENCOUNTER — Ambulatory Visit: Payer: 59 | Admitting: Nurse Practitioner

## 2021-07-20 VITALS — BP 141/93 | HR 70 | Temp 96.9°F | Resp 16 | Ht 76.0 in | Wt 240.0 lb

## 2021-07-20 DIAGNOSIS — R7309 Other abnormal glucose: Secondary | ICD-10-CM

## 2021-07-20 DIAGNOSIS — I10 Essential (primary) hypertension: Secondary | ICD-10-CM | POA: Diagnosis not present

## 2021-07-20 DIAGNOSIS — E782 Mixed hyperlipidemia: Secondary | ICD-10-CM

## 2021-07-20 DIAGNOSIS — Z79899 Other long term (current) drug therapy: Secondary | ICD-10-CM

## 2021-07-20 DIAGNOSIS — N529 Male erectile dysfunction, unspecified: Secondary | ICD-10-CM

## 2021-07-20 DIAGNOSIS — E559 Vitamin D deficiency, unspecified: Secondary | ICD-10-CM

## 2021-07-20 DIAGNOSIS — K219 Gastro-esophageal reflux disease without esophagitis: Secondary | ICD-10-CM

## 2021-07-20 MED ORDER — ALPRAZOLAM 1 MG PO TABS: ORAL_TABLET | ORAL | 0 refills | Status: DC

## 2021-07-21 LAB — CBC WITH DIFFERENTIAL/PLATELET
Absolute Monocytes: 478 cells/uL (ref 200–950)
Basophils Absolute: 18 cells/uL (ref 0–200)
Basophils Relative: 0.4 %
Eosinophils Absolute: 78 cells/uL (ref 15–500)
Eosinophils Relative: 1.7 %
HCT: 46.1 % (ref 38.5–50.0)
Hemoglobin: 15.5 g/dL (ref 13.2–17.1)
Lymphs Abs: 1228 cells/uL (ref 850–3900)
MCH: 33 pg (ref 27.0–33.0)
MCHC: 33.6 g/dL (ref 32.0–36.0)
MCV: 98.3 fL (ref 80.0–100.0)
MPV: 11 fL (ref 7.5–12.5)
Monocytes Relative: 10.4 %
Neutro Abs: 2797 cells/uL (ref 1500–7800)
Neutrophils Relative %: 60.8 %
Platelets: 259 10*3/uL (ref 140–400)
RBC: 4.69 10*6/uL (ref 4.20–5.80)
RDW: 12 % (ref 11.0–15.0)
Total Lymphocyte: 26.7 %
WBC: 4.6 10*3/uL (ref 3.8–10.8)

## 2021-07-21 LAB — LIPID PANEL
Cholesterol: 205 mg/dL — ABNORMAL HIGH
HDL: 42 mg/dL
LDL Cholesterol (Calc): 130 mg/dL — ABNORMAL HIGH
Non-HDL Cholesterol (Calc): 163 mg/dL — ABNORMAL HIGH
Total CHOL/HDL Ratio: 4.9 (calc)
Triglycerides: 193 mg/dL — ABNORMAL HIGH

## 2021-07-21 LAB — HEMOGLOBIN A1C
Hgb A1c MFr Bld: 5.3 % of total Hgb (ref <5.7)
Mean Plasma Glucose: 105 mg/dL
eAG (mmol/L): 5.8 mmol/L

## 2021-07-21 LAB — COMPLETE METABOLIC PANEL WITHOUT GFR
AG Ratio: 1.8 (calc) (ref 1.0–2.5)
ALT: 43 U/L (ref 9–46)
AST: 33 U/L (ref 10–40)
Albumin: 4.6 g/dL (ref 3.6–5.1)
Alkaline phosphatase (APISO): 41 U/L (ref 36–130)
BUN: 9 mg/dL (ref 7–25)
CO2: 28 mmol/L (ref 20–32)
Calcium: 9.9 mg/dL (ref 8.6–10.3)
Chloride: 102 mmol/L (ref 98–110)
Creat: 1.08 mg/dL (ref 0.60–1.29)
Globulin: 2.5 g/dL (ref 1.9–3.7)
Glucose, Bld: 103 mg/dL — ABNORMAL HIGH (ref 65–99)
Potassium: 4.3 mmol/L (ref 3.5–5.3)
Sodium: 140 mmol/L (ref 135–146)
Total Bilirubin: 0.6 mg/dL (ref 0.2–1.2)
Total Protein: 7.1 g/dL (ref 6.1–8.1)
eGFR: 86 mL/min/1.73m2

## 2021-07-21 LAB — VITAMIN D 25 HYDROXY (VIT D DEFICIENCY, FRACTURES): Vit D, 25-Hydroxy: 78 ng/mL (ref 30–100)

## 2021-07-21 LAB — TSH: TSH: 1.55 mIU/L (ref 0.40–4.50)

## 2021-07-21 LAB — MAGNESIUM: Magnesium: 2.4 mg/dL (ref 1.5–2.5)

## 2021-07-21 LAB — TESTOSTERONE: Testosterone: 843 ng/dL — ABNORMAL HIGH (ref 250–827)

## 2021-07-21 LAB — INSULIN, RANDOM: Insulin: 6.4 u[IU]/mL

## 2021-07-21 NOTE — Progress Notes (Signed)
<><><><><><><><><><><><><><><><><><><><><><><><><><><><><><><><><> <><><><><><><><><><><><><><><><><><><><><><><><><><><><><><><><><> -   Test results slightly outside the reference range are not unusual. If there is anything important, I will review this with you,  otherwise it is considered normal test values.  If you have further questions,  please do not hesitate to contact me at the office or via My Chart.  <><><><><><><><><><><><><><><><><><><><><><><><><><><><><><><><><> <><><><><><><><><><><><><><><><><><><><><><><><><><><><><><><><><>  -   -  Total  Chol =     205    - STILL Elevated             (  Ideal  or  Goal is less than 180  !  )  & -  Bad / Dangerous LDL  Chol =   130   - also STILL Elevated              (  Ideal  or  Goal is less than 70  !  )   - Recommend increase the Rosuvastatin to take EVERY day   - Diet is still very important   - Recommend a STRICTER  low cholesterol diet   - Cholesterol only comes from animal sources  - ie. meat, dairy, egg yolks  - Eat all the vegetables you want.  - Avoid Meat, Avoid Meat,  Avoid Meat - especially Red Meat - Beef AND Pork .  - Avoid cheese & dairy - milk & ice cream.     - Cheese is the most concentrated form of trans-fats which  is the worst thing to clog up our arteries.   - Veggie cheese is OK which can be found in the fresh  produce section at [employer] or [employer] or [employer] <><><><><><><><><><><><><><><><><><><><><><><><><><><><><><><><><>  -  Also Triglycerides (   193   ) or fats in blood are too high  (goal is less than 150)    - Recommend avoid fried & greasy foods,  sweets / candy,   - Avoid white rice  (brown or wild rice or Quinoa is OK),   - Avoid white potatoes  (sweet potatoes are OK)   - Avoid anything made from white flour  - bagels, doughnuts, rolls, buns, biscuits, white and   wheat breads, pizza crust and traditional  pasta made of white flour & egg white  - (vegetarian  pasta or spinach or wheat pasta is OK).    - Multi-grain bread is OK - like multi-grain flat bread or  sandwich thins.   - Avoid alcohol in excess.   - Exercise is also important. <><><><><><><><><><><><><><><><><><><><><><><><><><><><><><><><><> <><><><><><><><><><><><><><><><><><><><><><><><><><><><><><><><><>  -  A1c - Normal  - No  Diabetes  - Great ! <><><><><><><><><><><><><><><><><><><><><><><><><><><><><><><><><>  -  Vitamin D  = 78  - Excellent !  -  Please keep dose same  <><><><><><><><><><><><><><><><><><><><><><><><><><><><><><><><><>  -  All Else - CBC - Kidneys - Electrolytes - Liver - Magnesium & Thyroid    - all  Normal / OK <><><><><><><><><><><><><><><><><><><><><><><><><><><><><><><><><> <><><><><><><><><><><><><><><><><><><><><><><><><><><><><><><><><>

## 2021-08-03 ENCOUNTER — Other Ambulatory Visit: Payer: Self-pay | Admitting: Nurse Practitioner

## 2021-08-22 ENCOUNTER — Ambulatory Visit: Payer: 59 | Admitting: Internal Medicine

## 2021-08-22 ENCOUNTER — Other Ambulatory Visit: Payer: Self-pay | Admitting: Internal Medicine

## 2021-08-22 DIAGNOSIS — F418 Other specified anxiety disorders: Secondary | ICD-10-CM

## 2021-08-22 DIAGNOSIS — E291 Testicular hypofunction: Secondary | ICD-10-CM

## 2021-08-29 ENCOUNTER — Ambulatory Visit: Payer: 59 | Admitting: Internal Medicine

## 2021-09-12 ENCOUNTER — Ambulatory Visit: Payer: 59 | Admitting: Internal Medicine

## 2021-10-02 NOTE — Progress Notes (Signed)
R  E Cari Caraway  D                                                                                                                                                                                                                                              Future Appointments  Date Time Provider Department  10/03/2021  4:00 PM Lucky Cowboy, MD [facility]  12/26/2021  3:00 PM Lucky Cowboy, MD [facility]    History of Present Illness:               This very nice 46 y.o. MWM  with HTN, HLD, Pre-Diabetes, Vitamin D Deficiency and hx/o Depression who 2 months ago requested to retry  Zoloft         Medications  Current Outpatient Medications (Endocrine & Metabolic):    testosterone cypionate (DEPOTESTOSTERONE CYPIONATE) 200 MG/ML injection, Inject 1 ml into Muscle every 7 days  Current Outpatient Medications (Cardiovascular):    bisoprolol (ZEBETA) 10 MG tablet, Take  1 tablet  Daily for BP                           /          Take by mouth   enalapril (VASOTEC) 20 MG tablet, Take  2 tablets  Daily for BP                      /          Take by mouth   hydrochlorothiazide (HYDRODIURIL) 25 MG tablet, Take 1 tablet every Morning for BP & Fluid Retention   rosuvastatin (CRESTOR) 20 MG tablet, Take  1 tab  3 days / week  for Cholesterol.     Current Outpatient Medications (Other):    ALPRAZolam (XANAX) 1 MG tablet, TAKE 1/2 TO 1 TABLET ONCE DAILY IF NEEDED FOR ANXIETY ATTACK; LIMIT TO 5 DAYS PER WEEK TO AVOID ADDICTION AND DEMENTIA   cholecalciferol (VITAMIN D) 1000 UNITS tablet, Take 1,000 Units by mouth daily. Take 5000 IU daily.   Magnesium 400 MG CAPS, Take 1 capsule by mouth daily.   Omega-3 Fatty Acids (FISH OIL) 1000 MG CAPS, Take  2,000 mg by mouth daily.   omeprazole (PRILOSEC) 40 MG  capsule, TAKE 1 CAPSULE DAILY TO    PREVENT INDIGESTION AND    HEARTBURN   sertraline (ZOLOFT) 100 MG tablet, Take 1/2 top 1 tablet Daily for Mood & Anxiety  Problem list He has Essential hypertension; Hyperlipidemia; Testosterone Deficiency; Vitamin D deficiency; Depression with anxiety; Overweight (BMI 25.0-29.9); Other abnormal glucose; Medication management; Esophageal reflux; Sleep apnea; Upper abdominal pain; and Elevated LFTs on their problem list.   Observations/Objective:  There were no vitals taken for this visit.  HEENT - WNL. Neck - supple.  Chest - Clear equal BS. Cor - Nl HS. RRR w/o sig MGR. PP 1(+). No edema. MS- FROM w/o deformities.  Gait Nl. Neuro -  Nl w/o focal abnormalities.   Assessment and Plan:      Follow Up Instructions:        I discussed the assessment and treatment plan with the patient. The patient was provided an opportunity to ask questions and all were answered. The patient agreed with the plan and demonstrated an understanding of the instructions.       The patient was advised to call back or seek an in-person evaluation if the symptoms worsen or if the condition fails to improve as anticipated.    Marinus Maw, MD

## 2021-10-03 ENCOUNTER — Ambulatory Visit: Payer: 59 | Admitting: Internal Medicine

## 2021-10-09 ENCOUNTER — Other Ambulatory Visit: Payer: Self-pay | Admitting: Internal Medicine

## 2021-10-17 ENCOUNTER — Encounter: Payer: 59 | Admitting: Internal Medicine

## 2021-11-29 ENCOUNTER — Other Ambulatory Visit: Payer: Self-pay | Admitting: Internal Medicine

## 2021-11-29 DIAGNOSIS — I10 Essential (primary) hypertension: Secondary | ICD-10-CM

## 2021-12-26 ENCOUNTER — Encounter: Payer: Self-pay | Admitting: Internal Medicine

## 2021-12-26 ENCOUNTER — Ambulatory Visit (INDEPENDENT_AMBULATORY_CARE_PROVIDER_SITE_OTHER): Payer: 59 | Admitting: Internal Medicine

## 2021-12-26 VITALS — BP 140/78 | HR 66 | Temp 98.1°F | Resp 17 | Ht 76.0 in | Wt 256.4 lb

## 2021-12-26 DIAGNOSIS — Z8249 Family history of ischemic heart disease and other diseases of the circulatory system: Secondary | ICD-10-CM | POA: Diagnosis not present

## 2021-12-26 DIAGNOSIS — E782 Mixed hyperlipidemia: Secondary | ICD-10-CM

## 2021-12-26 DIAGNOSIS — N529 Male erectile dysfunction, unspecified: Secondary | ICD-10-CM

## 2021-12-26 DIAGNOSIS — G4701 Insomnia due to medical condition: Secondary | ICD-10-CM

## 2021-12-26 DIAGNOSIS — F1090 Alcohol use, unspecified, uncomplicated: Secondary | ICD-10-CM

## 2021-12-26 DIAGNOSIS — I10 Essential (primary) hypertension: Secondary | ICD-10-CM | POA: Diagnosis not present

## 2021-12-26 DIAGNOSIS — Z136 Encounter for screening for cardiovascular disorders: Secondary | ICD-10-CM

## 2021-12-26 DIAGNOSIS — Z23 Encounter for immunization: Secondary | ICD-10-CM

## 2021-12-26 DIAGNOSIS — Z111 Encounter for screening for respiratory tuberculosis: Secondary | ICD-10-CM | POA: Diagnosis not present

## 2021-12-26 DIAGNOSIS — Z0001 Encounter for general adult medical examination with abnormal findings: Secondary | ICD-10-CM

## 2021-12-26 DIAGNOSIS — Z125 Encounter for screening for malignant neoplasm of prostate: Secondary | ICD-10-CM

## 2021-12-26 DIAGNOSIS — F419 Anxiety disorder, unspecified: Secondary | ICD-10-CM

## 2021-12-26 DIAGNOSIS — Z1211 Encounter for screening for malignant neoplasm of colon: Secondary | ICD-10-CM

## 2021-12-26 DIAGNOSIS — K219 Gastro-esophageal reflux disease without esophagitis: Secondary | ICD-10-CM

## 2021-12-26 DIAGNOSIS — R7309 Other abnormal glucose: Secondary | ICD-10-CM

## 2021-12-26 DIAGNOSIS — Z79899 Other long term (current) drug therapy: Secondary | ICD-10-CM

## 2021-12-26 DIAGNOSIS — Z0000 Encounter for general adult medical examination without abnormal findings: Secondary | ICD-10-CM | POA: Diagnosis not present

## 2021-12-26 DIAGNOSIS — E559 Vitamin D deficiency, unspecified: Secondary | ICD-10-CM

## 2021-12-26 MED ORDER — TRAZODONE HCL 150 MG PO TABS: ORAL_TABLET | ORAL | 1 refills | Status: DC

## 2021-12-26 MED ORDER — NALTREXONE HCL 50 MG PO TABS: ORAL_TABLET | ORAL | 1 refills | Status: DC

## 2021-12-26 MED ORDER — HYDROXYZINE HCL 25 MG PO TABS: ORAL_TABLET | ORAL | 1 refills | Status: DC

## 2021-12-26 MED ORDER — PROPRANOLOL HCL 10 MG PO TABS: ORAL_TABLET | ORAL | 0 refills | Status: DC

## 2021-12-26 MED ORDER — BUSPIRONE HCL 10 MG PO TABS: ORAL_TABLET | ORAL | 1 refills | Status: DC

## 2021-12-26 NOTE — Patient Instructions (Addendum)
Due to recent changes in healthcare laws, you may see the results of your imaging and laboratory studies on MyChart before your provider has had a chance to review them.  We understand that in some cases there may be results that are confusing or concerning to you. Not all laboratory results come back in the same time frame and the provider may be waiting for multiple results in order to interpret others.  Please give us 48 hours in order for your provider to thoroughly review all the results before contacting the office for clarification of your results.  ° °++++++++++++++++++++++++++++++++++++++ ° Vit D  & °Vit C 1,000 mg   °are recommended to help protect  °against the Covid-19 and other Corona viruses.  ° ° Also it's recommended  °to take  °Zinc 50 mg  °to help  °protect against the Covid-19   °and best place to get ° is also on [employer]  °and don't pay more than 6-8 cents /pill !  °=============================== °Coronavirus (COVID-19) Are you at risk? ° °Are you at risk for the Coronavirus (COVID-19)? ° °To be considered HIGH RISK for Coronavirus (COVID-19), you have to meet the following criteria: ° °Traveled to [place], [place], [place], [place] or [place]; or in the [place] to [place], [place], [place]  °or [place]; and have fever, cough, and shortness of breath within the last 2 weeks of travel OR °Been in close contact with a person diagnosed with COVID-19 within the last 2 weeks and have  °fever, cough,and shortness of breath ° °IF YOU DO NOT MEET THESE CRITERIA, YOU ARE CONSIDERED LOW RISK FOR COVID-19. ° °What to do if you are HIGH RISK for COVID-19? ° °If you are having a medical emergency, call 911. °Seek medical care right away. Before you go to a doctor’s office, urgent care or emergency department, ° call ahead and tell them about your recent travel, contact with someone diagnosed with COVID-19  ° and your symptoms.  °You should receive instructions from your physician’s office  regarding next steps of care.  °When you arrive at healthcare provider, tell the healthcare staff immediately you have returned from  °visiting [place], [place], [place], [place] or [place]; or traveled in the [place] to [place], [place],  °[place] or [place] in the last two weeks or you have been in close contact with a person diagnosed with  °COVID-19 in the last 2 weeks.   °Tell the health care staff about your symptoms: fever, cough and shortness of breath. °After you have been seen by a medical provider, you will be either: °Tested for (COVID-19) and discharged home on quarantine except to seek medical care if  °symptoms worsen, and asked to  °Stay home and avoid contact with others until you get your results (4-5 days)  °Avoid travel on public transportation if possible (such as bus, train, or airplane) or °Sent to the Emergency Department by EMS for evaluation, COVID-19 testing  and  °possible admission depending on your condition and test results. ° °What to do if you are LOW RISK for COVID-19? ° °Reduce your risk of any infection by using the same precautions used for avoiding the common cold or flu:  °Wash your hands often with soap and warm water for at least 20 seconds.  If soap and water are not readily available,  °use an alcohol-based hand sanitizer with at least 60% alcohol.  °If coughing or sneezing, cover your mouth and nose by coughing   or sneezing into the elbow areas of your shirt or coat, ° into a tissue or into your sleeve (not your hands). °Avoid shaking hands with others and consider head nods or verbal greetings only. °Avoid touching your eyes, nose, or mouth with unwashed hands.  °Avoid close contact with people who are sick. °Avoid places or events with large numbers of people in one location, like concerts or sporting events. °Carefully consider travel plans you have or are making. °If you are planning any travel outside or inside the [place], visit the CDC’s Travelers’ Health  webpage for the latest health notices. °If you have some symptoms but not all symptoms, continue to monitor at home and seek medical attention  °if your symptoms worsen. °If you are having a medical emergency, call 911. °>>>>>>>>>>>>>>>>>>>>>>>>>>>> °Preventive Care for Adults ° °A healthy lifestyle and preventive care can promote health and wellness. Preventive health guidelines for men include the following key practices: °A routine yearly physical is a good way to check with your health care provider about your health and preventative screening. It is a chance to share any concerns and updates on your health and to receive a thorough exam. °Visit your dentist for a routine exam and preventative care every 6 months. Brush your teeth twice a day and floss once a day. Good oral hygiene prevents tooth decay and gum disease. °The frequency of eye exams is based on your age, health, family medical history, use of contact lenses, and other factors. Follow your health care provider's recommendations for frequency of eye exams. °Eat a healthy diet. Foods such as vegetables, fruits, whole grains, low-fat dairy products, and lean protein foods contain the nutrients you need without too many calories. Decrease your intake of foods high in solid fats, added sugars, and salt. Eat the right amount of calories for you. Get information about a proper diet from your health care provider, if necessary. °Regular physical exercise is one of the most important things you can do for your health. Most adults should get at least 150 minutes of moderate-intensity exercise (any activity that increases your heart rate and causes you to sweat) each week. In addition, most adults need muscle-strengthening exercises on 2 or more days a week. °Maintain a healthy weight. The body mass index (BMI) is a screening tool to identify possible weight problems. It provides an estimate of body fat based on height and weight. Your health care provider can  find your BMI and can help you achieve or maintain a healthy weight. For adults 20 years and older: °A BMI below 18.5 is considered underweight. °A BMI of 18.5 to 24.9 is normal. °A BMI of 25 to 29.9 is considered overweight. °A BMI of 30 and above is considered obese. °Maintain normal blood lipids and cholesterol levels by exercising and minimizing your intake of saturated fat. Eat a balanced diet with plenty of fruit and vegetables. Blood tests for lipids and cholesterol should begin at age 20 and be repeated every 5 years. If your lipid or cholesterol levels are high, you are over 50, or you are at high risk for heart disease, you may need your cholesterol levels checked more frequently. Ongoing high lipid and cholesterol levels should be treated with medicines if diet and exercise are not working. °If you smoke, find out from your health care provider how to quit. If you do not use tobacco, do not start. °Lung cancer screening is recommended for adults aged 55-80 years who are at high risk for   developing lung cancer because of a history of smoking. A yearly low-dose CT scan of the lungs is recommended for people who have at least a 30-pack-year history of smoking and are a current smoker or have quit within the past 15 years. A pack year of smoking is smoking an average of 1 pack of cigarettes a day for 1 year (for example: 1 pack a day for 30 years or 2 packs a day for 15 years). Yearly screening should continue until the smoker has stopped smoking for at least 15 years. Yearly screening should be stopped for people who develop a health problem that would prevent them from having lung cancer treatment. °If you choose to drink alcohol, do not have more than 2 drinks per day. One drink is considered to be 12 ounces (355 mL) of beer, 5 ounces (148 mL) of wine, or 1.5 ounces (44 mL) of liquor. °Avoid use of street drugs. Do not share needles with anyone. Ask for help if you need support or instructions about  stopping the use of drugs. °High blood pressure causes heart disease and increases the risk of stroke. Your blood pressure should be checked at least every 1-2 years. Ongoing high blood pressure should be treated with medicines, if weight loss and exercise are not effective. °If you are 45-79 years old, ask your health care provider if you should take aspirin to prevent heart disease. °Diabetes screening involves taking a blood sample to check your fasting blood sugar level. This should be done once every 3 years, after age 45, if you are within normal weight and without risk factors for diabetes. Testing should be considered at a younger age or be carried out more frequently if you are overweight and have at least 1 risk factor for diabetes. °Colorectal cancer can be detected and often prevented. Most routine colorectal cancer screening begins at the age of 50 and continues through age 75. However, your health care provider may recommend screening at an earlier age if you have risk factors for colon cancer. On a yearly basis, your health care provider may provide home test kits to check for hidden blood in the stool. Use of a small camera at the end of a tube to directly examine the colon (sigmoidoscopy or colonoscopy) can detect the earliest forms of colorectal cancer. Talk to your health care provider about this at age 50, when routine screening begins. Direct exam of the colon should be repeated every 5-10 years through age 75, unless early forms of precancerous polyps or small growths are found. ° Talk with your health care provider about prostate cancer screening. °Testicular cancer screening isrecommended for adult males. Screening includes self-exam, a health care provider exam, and other screening tests. Consult with your health care provider about any symptoms you have or any concerns you have about testicular cancer. °Use sunscreen. Apply sunscreen liberally and repeatedly throughout the day. You should  seek shade when your shadow is shorter than you. Protect yourself by wearing long sleeves, pants, a wide-brimmed hat, and sunglasses year round, whenever you are outdoors. °Once a month, do a whole-body skin exam, using a mirror to look at the skin on your back. Tell your health care provider about new moles, moles that have irregular borders, moles that are larger than a pencil eraser, or moles that have changed in shape or color. °Stay current with required vaccines (immunizations). °Influenza vaccine. All adults should be immunized every year. °Tetanus, diphtheria, and acellular pertussis (Td, Tdap) vaccine. An   adult who has not previously received Tdap or who does not know his vaccine status should receive 1 dose of Tdap. This initial dose should be followed by tetanus and diphtheria toxoids (Td) booster doses every 10 years. Adults with an unknown or incomplete history of completing a 3-dose immunization series with Td-containing vaccines should begin or complete a primary immunization series including a Tdap dose. Adults should receive a Td booster every 10 years. °Varicella vaccine. An adult without evidence of immunity to varicella should receive 2 doses or a second dose if he has previously received 1 dose. °Human papillomavirus (HPV) vaccine. Males aged 13-21 years who have not received the vaccine previously should receive the 3-dose series. Males aged 22-26 years may be immunized. Immunization is recommended through the age of 26 years for any male who has sex with males and did not get any or all doses earlier. Immunization is recommended for any person with an immunocompromised condition through the age of 26 years if he did not get any or all doses earlier. During the 3-dose series, the second dose should be obtained 4-8 weeks after the first dose. The third dose should be obtained 24 weeks after the first dose and 16 weeks after the second dose. °Zoster vaccine. One dose is recommended for adults  aged 60 years or older unless certain conditions are present. ° °PREVNAR  - Pneumococcal 13-valent conjugate (PCV13) vaccine. When indicated, a person who is uncertain of his immunization history and has no record of immunization should receive the PCV13 vaccine. An adult aged 19 years or older who has certain medical conditions and has not been previously immunized should receive 1 dose of PCV13 vaccine. This PCV13 should be followed with a dose of pneumococcal polysaccharide (PPSV23) vaccine. The PPSV23 vaccine dose should be obtained at least 1 r more year(s) after the dose of PCV13 vaccine. An adult aged 19 years or older who has certain medical conditions and previously received 1 or more doses of PPSV23 vaccine should receive 1 dose of PCV13. The PCV13 vaccine dose should be obtained 1 or more years after the last PPSV23 vaccine dose. ° °PNEUMOVAX - Pneumococcal polysaccharide (PPSV23) vaccine. When PCV13 is also indicated, PCV13 should be obtained first. All adults aged 65 years and older should be immunized. An adult younger than age 65 years who has certain medical conditions should be immunized. Any person who resides in a nursing home or long-term care facility should be immunized. An adult smoker should be immunized. People with an immunocompromised condition and certain other conditions should receive both PCV13 and PPSV23 vaccines. People with human immunodeficiency virus (HIV) infection should be immunized as soon as possible after diagnosis. Immunization during chemotherapy or radiation therapy should be avoided. Routine use of PPSV23 vaccine is not recommended for American Indians, [place] Natives, or people younger than 65 years unless there are medical conditions that require PPSV23 vaccine. When indicated, people who have unknown immunization and have no record of immunization should receive PPSV23 vaccine. One-time revaccination 5 years after the first dose of PPSV23 is recommended for people  aged 19-64 years who have chronic kidney failure, nephrotic syndrome, asplenia, or immunocompromised conditions. People who received 1-2 doses of PPSV23 before age 65 years should receive another dose of PPSV23 vaccine at age 65 years or later if at least 5 years have passed since the previous dose. Doses of PPSV23 are not needed for people immunized with PPSV23 at or after age 65 years. ° °Hepatitis A vaccine.   Adults who wish to be protected from this disease, have certain high-risk conditions, work with hepatitis A-infected animals, work in hepatitis A research labs, or travel to or work in countries with a high rate of hepatitis A should be immunized. Adults who were previously unvaccinated and who anticipate close contact with an international adoptee during the first 60 days after arrival in the [place] from a country with a high rate of hepatitis A should be immunized. ° °Hepatitis B vaccine. Adults should be immunized if they wish to be protected from this disease, have certain high-risk conditions, may be exposed to blood or other infectious body fluids, are household contacts or sex partners of hepatitis B positive people, are clients or workers in certain care facilities, or travel to or work in countries with a high rate of hepatitis B. ° °Preventive Service / Frequency ° °Ages 40 to 64 °Blood pressure check. °Lipid and cholesterol check °Lung cancer screening. / Every year if you are aged 55-80 years and have a 30-pack-year history of smoking and currently smoke or have quit within the past 15 years. Yearly screening is stopped once you have quit smoking for at least 15 years or develop a health problem that would prevent you from having lung cancer treatment. °Fecal occult blood test (FOBT) of stool. / Every year beginning at age 50 and continuing until age 75. You may not have to do this test if you get a colonoscopy every 10 years. °Flexible sigmoidoscopy** or colonoscopy.** / Every 5 years for  a flexible sigmoidoscopy or every 10 years for a colonoscopy beginning at age 50 and continuing until age 75. °Screening for abdominal aortic aneurysm (AAA)  by ultrasound is recommended for people who have history of high blood pressure or who are current or former smokers. °+++++++++++ °Recommend Adult Low Dose Aspirin or  °coated  Aspirin 81 mg daily  °To reduce risk of Colon Cancer 40 %, ° Skin Cancer 26 % ,  °Malignant Melanoma 46% ° and  °Pancreatic cancer 60% °++++++++++++++++++++ °Vitamin D goal ° is between 70-100.  °Please make sure that you are taking your Vitamin D as directed.  °It is very important as a natural anti-inflammatory  °helping hair, skin, and nails, as well as reducing stroke and heart attack risk.  °It helps your bones and helps with mood. °It also decreases numerous cancer risks so please take it as directed.  °Low Vit D is associated with a 200-300% higher risk for CANCER  °and 200-300% higher risk for HEART   ATTACK  &  STROKE.   °...................................... °It is also associated with higher death rate at younger ages,  °autoimmune diseases like Rheumatoid arthritis, Lupus, Multiple Sclerosis.    °Also many other serious conditions, like depression, Alzheimer's °Dementia, infertility, muscle aches, fatigue, fibromyalgia - just to name a few. °+++++++++++++++++++++ °Recommend the book "The END of DIETING" by Dr Joel Fuhrman  °& the book "The END of DIABETES " by Dr Joel Fuhrman °At [employer] - get book & Audio CD's  °  Being diabetic has a  300% increased risk for heart attack, stroke, cancer, and alzheimer- type vascular dementia. It is very important that you work harder with diet by avoiding all foods that are white. Avoid white rice (brown & wild rice is OK), white potatoes (sweetpotatoes in moderation is OK), White bread or wheat bread or anything made out of white flour like bagels, donuts, rolls, buns, biscuits, cakes, pastries, cookies, pizza crust, and pasta (made    from white flour & egg whites) - vegetarian pasta or spinach or wheat pasta is OK. Multigrain breads like Arnold's or [employer], or multigrain sandwich thins or flatbreads.  Diet, exercise and weight loss can reverse and cure diabetes in the early stages.  Diet, exercise and weight loss is very important in the control and prevention of complications of diabetes which affects every system in your body, ie. Brain - dementia/stroke, eyes - glaucoma/blindness, heart - heart attack/heart failure, kidneys - dialysis, stomach - gastric paralysis, intestines - malabsorption, nerves - severe painful neuritis, circulation - gangrene & loss of a leg(s), and finally cancer and Alzheimers. ° °  I recommend avoid fried & greasy foods,  sweets/candy, white rice (brown or wild rice or Quinoa is OK), white potatoes (sweet potatoes are OK) - anything made from white flour - bagels, doughnuts, rolls, buns, biscuits,white and wheat breads, pizza crust and traditional pasta made of white flour & egg white(vegetarian pasta or spinach or wheat pasta is OK).  Multi-grain bread is OK - like multi-grain flat bread or sandwich thins. Avoid alcohol in excess. Exercise is also important. ° °  Eat all the vegetables you want - avoid meat, especially red meat and dairy - especially cheese.  Cheese is the most concentrated form of trans-fats which is the worst thing to clog up our arteries. Veggie cheese is OK which can be found in the fresh produce section at [employer] or [employer] or [employer] ° °++++++++++++++++++++++ °DASH Eating Plan ° °DASH stands for "Dietary Approaches to Stop Hypertension."  ° °The DASH eating plan is a healthy eating plan that has been shown to reduce high blood pressure (hypertension). Additional health benefits may include reducing the risk of type 2 diabetes mellitus, heart disease, and stroke. The DASH eating plan may also help with weight loss. °WHAT DO I NEED TO KNOW ABOUT THE DASH EATING PLAN? °For  the DASH eating plan, you will follow these general guidelines: °Choose foods with a percent daily value for sodium of less than 5% (as listed on the food label). °Use salt-free seasonings or herbs instead of table salt or sea salt. °Check with your health care provider or pharmacist before using salt substitutes. °Eat lower-sodium products, often labeled as "lower sodium" or "no salt added." °Eat fresh foods. °Eat more vegetables, fruits, and low-fat dairy products. °Choose whole grains. Look for the word "whole" as the first word in the ingredient list. °Choose fish  °Limit sweets, desserts, sugars, and sugary drinks. °Choose heart-healthy fats. °Eat veggie cheese  °Eat more home-cooked food and less restaurant, buffet, and fast food. °Limit fried foods. °Cook foods using methods other than frying. °Limit canned vegetables. If you do use them, rinse them well to decrease the sodium. °When eating at a restaurant, ask that your food be prepared with less salt, or no salt if possible. °                  °   WHAT FOODS CAN I EAT? °Read Dr Joel Fuhrman's books on The End of Dieting & The End of Diabetes ° °Grains °Whole grain or whole wheat bread. Brown rice. Whole grain or whole wheat pasta. Quinoa, bulgur, and whole grain cereals. Low-sodium cereals. Corn or whole wheat flour tortillas. Whole grain cornbread. Whole grain crackers. Low-sodium crackers. ° °Vegetables °Fresh or frozen vegetables (raw, steamed, roasted, or grilled). Low-sodium or reduced-sodium tomato and vegetable juices. Low-sodium or reduced-sodium tomato sauce and paste. Low-sodium or reduced-sodium canned vegetables.  ° °  Fruits All fresh, canned (in natural juice), or frozen fruits.  Protein Products  All fish and seafood.  Dried beans, peas, or lentils. Unsalted nuts and seeds. Unsalted canned beans.  Dairy Low-fat dairy products, such as skim or 1% milk, 2% or reduced-fat cheeses, low-fat ricotta or cottage cheese, or plain low-fat yogurt.  Low-sodium or reduced-sodium cheeses.  Fats and Oils Tub margarines without trans fats. Light or reduced-fat mayonnaise and salad dressings (reduced sodium). Avocado. Safflower, olive, or canola oils. Natural peanut or almond butter.  Other Unsalted popcorn and pretzels. The items listed above may not be a complete list of recommended foods or beverages. Contact your dietitian for more options.  +++++++++++++++++++  WHAT FOODS ARE NOT RECOMMENDED? Grains/ White flour or wheat flour White bread. White pasta. White rice. Refined cornbread. Bagels and croissants. Crackers that contain trans fat.  Vegetables  Creamed or fried vegetables. Vegetables in a . Regular canned vegetables. Regular canned tomato sauce and paste. Regular tomato and vegetable juices.  Fruits Dried fruits. Canned fruit in light or heavy syrup. Fruit juice.  Meat and Other Protein Products Meat in general - RED meat & White meat.  Fatty cuts of meat. Ribs, chicken wings, all processed meats as bacon, sausage, bologna, salami, fatback, hot dogs, bratwurst and packaged luncheon meats.  Dairy Whole or 2% milk, cream, half-and-half, and cream cheese. Whole-fat or sweetened yogurt. Full-fat cheeses or blue cheese. Non-dairy creamers and whipped toppings. Processed cheese, cheese spreads, or cheese curds.  Condiments Onion and garlic salt, seasoned salt, table salt, and sea salt. Canned and packaged gravies. Worcestershire sauce. Tartar sauce. Barbecue sauce. Teriyaki sauce. Soy sauce, including reduced sodium. Steak sauce. Fish sauce. Oyster sauce. Cocktail sauce. Horseradish. Ketchup and mustard. Meat flavorings and tenderizers. Bouillon cubes. Hot sauce. Tabasco sauce. Marinades. Taco seasonings. Relishes.  Fats and Oils Butter, stick margarine, lard, shortening and bacon fat. Coconut, palm kernel, or palm oils. Regular salad dressings.  Pickles and olives. Salted popcorn and pretzels.  The items listed above may not  be a complete list of foods and beverages to avoid.  ==================================  Alcohol Use Disorder  Alcohol use disorder is a condition in which drinking disrupts daily life. People with this condition drink too much alcohol and cannot control their drinking.  Alcohol use disorder can cause serious problems with physical health. It can affect the brain, heart, and other internal organs. This disorder can raise the risk for certain cancers and cause problems with mental health, such as depression or anxiety.  What are the causes?  This condition is caused by drinking too much alcohol over time. Some people with this condition drink to cope with or escape from negative life events. Others drink to relieve symptoms of physical pain or symptoms of mental illness.  What increases the risk?  You are more likely to develop this condition if: You have a family history of alcohol use disorder. Your culture encourages drinking to the point of becoming drunk (intoxication). You had a mood or conduct disorder in childhood. You have been abused. You are an adolescent and you: Have poor performance in school. Have poor supervision or guidance. Act on impulse and like taking risks.  What are the signs or symptoms?  Symptoms of this condition include: Drinking more than you want to. Trying several times without success to drink less. Spending a lot of time thinking about alcohol, getting alcohol, drinking alcohol, or recovering from drinking alcohol. Continuing to drink even when it is causing serious  problems in your daily life. Drinking when it is dangerous to drink, such as before driving a car. Needing more and more alcohol to get the same effect you want (building up tolerance).  Having symptoms of withdrawal when you stop drinking. Withdrawal symptoms may include: Trouble sleeping, leading to tiredness (fatigue). Mood swings of depression and anxiety. Physical symptoms, such  as a fast heart rate, rapid breathing, high blood pressure (hypertension), fever, cold sweats, or nausea. Seizures. Severe confusion. Feeling or seeing things that are not there (hallucinations). Shaking movements that you cannot control (tremors).  How is this diagnosed?  This condition is diagnosed with an assessment. Your health care provider may start by asking three or four questions about your drinking, or they may give you a simple test to take. This helps to get clear information from you.  You may also have a physical exam or lab tests. You may be referred to a substance abuse counselor. How is this treated?  With education, some people with alcohol use disorder are able to reduce their drinking. Many with this disorder cannot change their drinking behavior on their own and need help with treatment from substance use specialists. Treatments may include:  Detoxification. Detoxification involves quitting drinking with supervision and direction of health care providers. Your health care provider may prescribe medicines within the first week to help lessen withdrawal symptoms. Alcohol withdrawal can be dangerous and life-threatening. Detoxification may be provided in a home, community, or primary care setting, or in a hospital or substance use treatment facility.  Counseling. This may involve motivational interviewing (MI), family therapy, or cognitive behavioral therapy (CBT). A counselor can address the things you can do to change your drinking behavior and how to maintain the changes. Talk therapy aims to: Identify your positive motivations to change. Identify and avoid the things that trigger your drinking. Help you learn how to plan your behavior change. Develop support systems that can help you sustain the change.  Medicines. Medicines can help treat this disorder by: Decreasing cravings. Decreasing the positive feeling you have when you drink. Causing an uncomfortable physical  reaction when you drink (aversion therapy).  Support groups such as Alcoholics Anonymous (AA). These groups are led by people who have quit drinking. The groups provide emotional support, advice, and guidance.  Some people with this condition benefit from a combination of treatments provided by specialized substance use treatment centers. Follow these instructions at home:   Medicines  Take over-the-counter and prescription medicines only as told by your health care provider. Ask before starting any new medicines, herbs, or supplements.  General instructions  Ask friends and family members to support your choice to stay sober. Avoid places where alcohol is served. Create a plan to deal with tempting situations. Attend support groups regularly. Practice hobbies or activities you enjoy.  Do not drink and drive.  How is this prevented?  Do not drink alcohol if your health care provider tells you not to drink.  If you drink alcohol:  Limit how much you have to: 0-1 drink a day for women who are not pregnant. 0-2 drinks a day for men. Know how much alcohol is in your drink. In the [place] one drink equals one 12 oz bottle of beer (355 mL), one 5 oz glass of wine (148 mL), or one 1 oz glass of hard liquor (44 mL).  If you have a mental health condition, seek treatment.  Develop a healthy lifestyle through: Meditation or deep breathing. Exercise. Spending  time in nature. Listening to music. Talking with a trusted friend or family member.  Do not drink alcohol. Avoid gatherings where you might be tempted to drink alcohol.  Do not be afraid to say no if someone offers you alcohol. Speak up about why you do not want to drink. Set a positive example for others around you by not drinking.  Build relationships with friends who do not drink.  Where to find more information  Substance Abuse and Mental Health Services Administration: Sameday[employer].com.cy Alcoholics Anonymous:  ShedSizes.ch  Contact a health care provider if:  You cannot take your medicines as told. Your symptoms get worse or you experience symptoms of withdrawal when you stop drinking. You start drinking again (relapse) and your symptoms get worse. Get help right away if: You have thoughts about  hurting yourself or others.  Get help right away if you feel like you may hurt yourself or others, or have thoughts about taking your own life. Go to your nearest emergency room or:  Call 911.  Call the [facility] at 380-638-3087 or 988. This is open 24 hours a day.  Text the Crisis Text Line at 779-662-3458.  Summary Alcohol use disorder is a condition in which drinking disrupts daily life. People with this condition drink too much alcohol and cannot control their drinking. Treatment may include detoxification, counseling, medicines, and support groups. Ask friends and family members to support you. Avoid situations where alcohol is served. Get help right away if you have thoughts about hurting yourself or others.

## 2021-12-26 NOTE — Progress Notes (Signed)
Annual  Screening/Preventative Visit   & Comprehensive Evaluation & Examination   Future Appointments  Date Time Provider Department  12/26/2021  3:00 PM Unk Pinto, MD [facility]  01/01/2023  3:00 PM Unk Pinto, MD [facility]            This very nice 46 y.o.  MWM  presents for a Screening /Preventative Visit & comprehensive evaluation and management of multiple medical co-morbidities.  Patient has been followed for HTN, HLD, Prediabetes and Vitamin D Deficiency. Patient has GERD controlled on his meds.   Patient also relates he returns after recent hospitalization in [place] ( or [place] ) from a 62 day Rehab program for Detox /Rehab program from alcohol & has only been home about 12-13 days. His meds have changed & we discussed adding Depade ( Naltrexone) .         HTN predates since 68 (age 25) . Patient's BP has been controlled at home.  Today's BP at goal -  140/78. Patient denies any cardiac symptoms as chest pain, palpitations, shortness of breath, dizziness or ankle swelling.        Patient's hyperlipidemia is controlled with diet and Rosuvastatin . Patient denies myalgias or other medication SE's. Last lipids were not at goal :  Lab Results  Component Value Date   CHOL 205 (H) 07/20/2021   HDL 42 07/20/2021   LDLCALC 130 (H) 07/20/2021   TRIG 193 (H) 07/20/2021   CHOLHDL 4.9 07/20/2021         Patient has hx/o prediabetes since    and patient denies reactive hypoglycemic symptoms, visual blurring, diabetic polys or paresthesias. Last A1c was normal & at goal :   Lab Results  Component Value Date   HGBA1C 5.3 07/20/2021          Finally, patient has history of Vitamin D Deficiency of    and last vitamin D was   Lab Results  Component Value Date   VD25OH 78 07/20/2021   .meds    Allergies  Allergen Reactions   Citalopram Other (See Comments)    anxiety   Pravastatin    Trazodone And Nefazodone    Wellbutrin [Bupropion]       Past Medical History:  Diagnosis Date   Anxiety    Depression    GERD (gastroesophageal reflux disease)    Hyperlipidemia    Hypertension    Hypogonadism male    Obesity    OSA on CPAP 2019   Vitamin D deficiency      Health Maintenance  Topic Date Due   HPV VACCINES (2 - 3-dose SCDM series) 07/25/2017   COLONOSCOPY (Pts 45-15yrs Insurance coverage will need to be confirmed)  Never done   INFLUENZA VACCINE  09/20/2021   TETANUS/TDAP  04/05/2025   Hepatitis C Screening  Completed   HIV Screening  Completed     Immunization History  Administered Date(s) Administered   HPV 9-valent 06/27/2017   Influenza Inj Mdck Quad  11/08/2016   Influenza Whole 12/19/2011   Influenza, Seasonal 02/02/2015   PPD Test 01/09/2013, 04/06/2015   Td 02/21/2004   Tdap 04/06/2015    Last Colon -   No past surgical history on file.   Family History  Problem Relation Age of Onset   Hypertension Mother    Depression Mother    Fibromyalgia Mother    Depression Brother    Hypertension Brother    Cancer Father 62       urethral/lung cancer  Depression Sister      Social History   Tobacco Use   Smoking status: Never   Smokeless tobacco: Never  Substance Use Topics   Alcohol use: Yes    Types: 12 drink(s) per week   Drug use: No      ROS Constitutional: Denies fever, chills, weight loss/gain, headaches, insomnia,  night sweats or change in appetite. Does c/o fatigue. Eyes: Denies redness, blurred vision, diplopia, discharge, itchy or watery eyes.  ENT: Denies discharge, congestion, post nasal drip, epistaxis, sore throat, earache, hearing loss, dental pain, Tinnitus, Vertigo, Sinus pain or snoring.  Cardio: Denies chest pain, palpitations, irregular heartbeat, syncope, dyspnea, diaphoresis, orthopnea, PND, claudication or edema Respiratory: denies cough, dyspnea, DOE, pleurisy, hoarseness, laryngitis or wheezing.  Gastrointestinal: Denies dysphagia, heartburn, reflux,  water brash, pain, cramps, nausea, vomiting, bloating, diarrhea, constipation, hematemesis, melena, hematochezia, jaundice or hemorrhoids Genitourinary: Denies dysuria, frequency, urgency, nocturia, hesitancy, discharge, hematuria or flank pain Musculoskeletal: Denies arthralgia, myalgia, stiffness, Jt. Swelling, pain, limp or strain/sprain. Denies Falls. Skin: Denies puritis, rash, hives, warts, acne, eczema or change in skin lesion Neuro: No weakness, tremor, incoordination, spasms, paresthesia or pain Psychiatric: Denies confusion, memory loss or sensory loss. Denies Depression. Endocrine: Denies change in weight, skin, hair change, nocturia, and paresthesia, diabetic polys, visual blurring or hyper / hypo glycemic episodes.  Heme/Lymph: No excessive bleeding, bruising or enlarged lymph nodes.   Physical Exam  BP (!) 140/78   Pulse 66   Temp 98.1 F (36.7 C)   Resp 17   Ht 6\' 4"  (1.93 m)   Wt 256 lb 6.4 oz (116.3 kg)   SpO2 99%   BMI 31.21 kg/m   General Appearance: Well nourished and well groomed and in no apparent distress.  Eyes: PERRLA, EOMs, conjunctiva no swelling or erythema, normal fundi and vessels. Sinuses: No frontal/maxillary tenderness ENT/Mouth: EACs patent / TMs  nl. Nares clear without erythema, swelling, mucoid exudates. Oral hygiene is good. No erythema, swelling, or exudate. Tongue normal, non-obstructing. Tonsils not swollen or erythematous. Hearing normal.  Neck: Supple, thyroid not palpable. No bruits, nodes or JVD. Respiratory: Respiratory effort normal.  BS equal and clear bilateral without rales, rhonci, wheezing or stridor. Cardio: Heart sounds are normal with regular rate and rhythm and no murmurs, rubs or gallops. Peripheral pulses are normal and equal bilaterally without edema. No aortic or femoral bruits. Chest: symmetric with normal excursions and percussion.  Abdomen: Soft, with Nl bowel sounds. Nontender, no guarding, rebound, hernias, masses, or  organomegaly.  Lymphatics: Non tender without lymphadenopathy.  Musculoskeletal: Full ROM all peripheral extremities, joint stability, 5/5 strength, and normal gait. Skin: Warm and dry without rashes, lesions, cyanosis, clubbing or  ecchymosis.  Neuro: Cranial nerves intact, reflexes equal bilaterally. Normal muscle tone, no cerebellar symptoms. Sensation intact.  Pysch: Alert and oriented X 3 with normal affect, insight and judgment appropriate.   Assessment and Plan  1. Annual Preventative/Screening Exam    2. Essential hypertension - EKG 12-Lead - [place], RETROPERITNL ABD,  LTD - Urinalysis, Routine w reflex microscopic - Microalbumin / creatinine urine ratio - CBC with Differential/Platelet - COMPLETE METABOLIC PANEL WITH GFR - Magnesium  3. Hyperlipidemia, mixed  - EKG 12-Lead - [place], RETROPERITNL ABD,  LTD - Lipid panel - TSH  4. Abnormal glucose  - EKG 12-Lead - [place], RETROPERITNL ABD,  LTD - Hemoglobin A1c - Insulin, random  5. Gastroesophageal reflux disease  - CBC with Differential/Platelet  6. Vitamin D deficiency  - VITAMIN D 25  Hydroxy (Vit-D Deficiency, Fractures)  7. Testosterone Deficiency  - Testosterone  8. Screening-pulmonary TB  - TB Skin Test  9. Screening for colorectal cancer  - POC Hemoccult Bld/Stl (3-Cd Home Screen); Future  10. Screening for prostate cancer  - PSA  11. Screening for heart disease  - EKG 12-Lead  12. Family history of hypertension  - [place], RETROPERITNL ABD,  LTD - Iron, Total/Total Iron Binding Cap - Vitamin B12 - CBC with Differential/Platelet - TSH  13. Medication management  - Urinalysis, Routine w reflex microscopic - Microalbumin / creatinine urine ratio - Testosterone - CBC with Differential/Platelet - COMPLETE METABOLIC PANEL WITH GFR - Magnesium - Lipid panel - TSH - Hemoglobin A1c - Insulin, random - VITAMIN D 25 Hydroxy (Vit-D Deficiency, Fractures)  14. Need for immunization against  influenza  - Flu Vaccine QUAD 6+ mos PF IM (Fluarix Quad PF)   15. Alcohol use disorder  - naltrexone (DEPADE) 50 MG tablet;  Take  1 tablet  Daily  to Suppress Alcohol Cravings   Disp: 90 tablet; Refill: 1   16. 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  Discussed med effects and SE's. Routine screening labs and tests as requested with regular follow-up as recommended. Over 40 minutes of exam, counseling, chart review and high complex critical decision making was performed   Kirtland Bouchard, MD

## 2021-12-27 LAB — COMPLETE METABOLIC PANEL WITH GFR
AG Ratio: 2.1 (calc) (ref 1.0–2.5)
ALT: 41 U/L (ref 9–46)
AST: 30 U/L (ref 10–40)
Albumin: 4.8 g/dL (ref 3.6–5.1)
Alkaline phosphatase (APISO): 50 U/L (ref 36–130)
BUN: 12 mg/dL (ref 7–25)
CO2: 30 mmol/L (ref 20–32)
Calcium: 9.7 mg/dL (ref 8.6–10.3)
Chloride: 100 mmol/L (ref 98–110)
Creat: 1.04 mg/dL (ref 0.60–1.29)
Globulin: 2.3 g/dL (calc) (ref 1.9–3.7)
Glucose, Bld: 78 mg/dL (ref 65–99)
Potassium: 3.9 mmol/L (ref 3.5–5.3)
Sodium: 137 mmol/L (ref 135–146)
Total Bilirubin: 0.7 mg/dL (ref 0.2–1.2)
Total Protein: 7.1 g/dL (ref 6.1–8.1)
eGFR: 90 mL/min/{1.73_m2} (ref >=60)

## 2021-12-27 LAB — LIPID PANEL
Cholesterol: 187 mg/dL (ref <200)
HDL: 46 mg/dL (ref >=40)
LDL Cholesterol (Calc): 119 mg/dL (calc) — ABNORMAL HIGH
Non-HDL Cholesterol (Calc): 141 mg/dL (calc) — ABNORMAL HIGH (ref <130)
Total CHOL/HDL Ratio: 4.1 (calc) (ref <5.0)
Triglycerides: 113 mg/dL (ref <150)

## 2021-12-27 LAB — URINALYSIS, ROUTINE W REFLEX MICROSCOPIC
Bilirubin Urine: NEGATIVE
Glucose, UA: NEGATIVE
Hgb urine dipstick: NEGATIVE
Ketones, ur: NEGATIVE
Leukocytes,Ua: NEGATIVE
Nitrite: NEGATIVE
Protein, ur: NEGATIVE
Specific Gravity, Urine: 1.007 (ref 1.001–1.035)
pH: 7 (ref 5.0–8.0)

## 2021-12-27 LAB — CBC WITH DIFFERENTIAL/PLATELET
Absolute Monocytes: 370 cells/uL (ref 200–950)
Basophils Absolute: 20 cells/uL (ref 0–200)
Basophils Relative: 0.4 %
Eosinophils Absolute: 60 cells/uL (ref 15–500)
Eosinophils Relative: 1.2 %
HCT: 44.5 % (ref 38.5–50.0)
Hemoglobin: 15.4 g/dL (ref 13.2–17.1)
Lymphs Abs: 1395 cells/uL (ref 850–3900)
MCH: 32.4 pg (ref 27.0–33.0)
MCHC: 34.6 g/dL (ref 32.0–36.0)
MCV: 93.7 fL (ref 80.0–100.0)
MPV: 11.7 fL (ref 7.5–12.5)
Monocytes Relative: 7.4 %
Neutro Abs: 3155 cells/uL (ref 1500–7800)
Neutrophils Relative %: 63.1 %
Platelets: 224 10*3/uL (ref 140–400)
RBC: 4.75 10*6/uL (ref 4.20–5.80)
RDW: 11.8 % (ref 11.0–15.0)
Total Lymphocyte: 27.9 %
WBC: 5 10*3/uL (ref 3.8–10.8)

## 2021-12-27 LAB — TSH: TSH: 1.88 mIU/L (ref 0.40–4.50)

## 2021-12-27 LAB — IRON, TOTAL/TOTAL IRON BINDING CAP
%SAT: 54 % (calc) — ABNORMAL HIGH (ref 20–48)
Iron: 174 ug/dL (ref 50–180)
TIBC: 320 mcg/dL (calc) (ref 250–425)

## 2021-12-27 LAB — PSA: PSA: 0.27 ng/mL (ref <=4.00)

## 2021-12-27 LAB — HEMOGLOBIN A1C
Hgb A1c MFr Bld: 5.3 % of total Hgb (ref <5.7)
Mean Plasma Glucose: 105 mg/dL
eAG (mmol/L): 5.8 mmol/L

## 2021-12-27 LAB — MAGNESIUM: Magnesium: 2.2 mg/dL (ref 1.5–2.5)

## 2021-12-27 LAB — MICROALBUMIN / CREATININE URINE RATIO
Creatinine, Urine: 34 mg/dL (ref 20–320)
Microalb, Ur: <0.2 mg/dL

## 2021-12-27 LAB — VITAMIN B12: Vitamin B-12: 401 pg/mL (ref 200–1100)

## 2021-12-27 LAB — TESTOSTERONE: Testosterone: 1619 ng/dL — ABNORMAL HIGH (ref 250–827)

## 2021-12-27 LAB — VITAMIN D 25 HYDROXY (VIT D DEFICIENCY, FRACTURES): Vit D, 25-Hydroxy: 55 ng/mL (ref 30–100)

## 2021-12-27 LAB — INSULIN, RANDOM: Insulin: 5.3 u[IU]/mL

## 2021-12-27 NOTE — Progress Notes (Signed)
<><><><><><><><><><><><><><><><><><><><><><><><><><><><><><><><><> <><><><><><><><><><><><><><><><><><><><><><><><><><><><><><><><><> - Test results slightly outside the reference range are not unusual. If there is anything important, I will review this with you,  otherwise it is considered normal test values.  If you have further questions,  please do not hesitate to contact me at the office or via My Chart.  <><><><><><><><><><><><><><><><><><><><><><><><><><><><><><><><><> <><><><><><><><><><><><><><><><><><><><><><><><><><><><><><><><><>  -  Blood Iron level is slightly elevated . So don't take any extra iron supplements  <><><><><><><><><><><><><><><><><><><><><><><><><><><><><><><><><>  -  Testosterone is VERY high from recent sho , So recommend cut dose in 1/2 to 1/2 ml every week  <><><><><><><><><><><><><><><><><><><><><><><><><><><><><><><><><>  -   - Total Chol =   187  is slightly elevated                  ( Ideal or Goal is less than 180 ! )  & - Bad /Dangerous LDL Chol =  119    - - >> Moderately Elevated  !     ( Ideal or Goal is less than 70 ! )   - Treating with meds to lower Cholesterol is treating the result                                          & NOT treating the cause - The cause is Bad Diet !  - Read or listen to   Dr Wyman Songster 's book    " How Not to Die ! "   - Recommend a stricter plant based low cholesterol diet   - Cholesterol only comes from animal sources                                                                                      - ie. meat, dairy, egg yolks  - Eat all the vegetables you want.  - Avoid meat, Avoid Meat , Avoid Meat ! ! !                                                                      especially red meat - Beef AND Pork   - Avoid cheese & dairy - milk & ice cream  - Cheese is the most concentrated form of trans-fats which                                                                 is the worst thing to clog up our arteries.   - Veggie cheese is OK which can be  found in the fresh produce section at                                                            [facility] or [employer] or [employer] <><><><><><><><><><><><><><><><><><><><><><><><><><><><><><><><><>  -  Continue Rosuvastatin same  3 x /week <><><><><><><><><><><><><><><><><><><><><><><><><><><><><><><><><>  -  Vitamin B12 =      401     Very Low  (Ideal or Goal Vit B12 is between 450 - 1,100)   Low Vit B12 may be associated with Anemia , Fatigue,   Peripheral Neuropathy, Dementia, "Brain Fog", & Depression  - Recommend take a sub-lingual form of Vitamin B12 tablet   1,000 to 5,000 mcg tab that you dissolve under your tongue /Daily   - Can get Lavonia Dana - best price at [employer] or on [employer] <><><><><><><><><><><><><><><><><><><><><><><><><><><><><><><><><>  -  PSA is Low - No Prostate Cancer   - Great !   <><><><><><><><><><><><><><><><><><><><><><><><><><><><><><><><><>  -  A1c - Normal - No Diabetes  - Great ! <><><><><><><><><><><><><><><><><><><><><><><><><><><><><><><><><>  -  Vitamin D = 55 is a little low   - Vitamin D goal is between 70-100.   - Please make sure that you are taking your Vitamin D as directed.   - It is very important as a natural anti-inflammatory and helping the  immune system protect against viral infections, like the Covid-19    helping hair, skin, and nails, as well as reducing stroke and  heart attack risk.   - It helps your bones and helps with mood.  - It also decreases numerous cancer risks so please  take it as directed.   - Low Vit D is associated with a 200-300% higher risk for  CANCER   and 200-300% higher risk for HEART   ATTACK  &  STROKE.    - It is also associated with higher death rate at younger ages,   autoimmune diseases like Rheumatoid arthritis, Lupus,   Multiple Sclerosis.     - Also many other serious conditions, like depression, Alzheimer's  Dementia, infertility, muscle aches, fatigue, fibromyalgia   - just to name a few. <><><><><><><><><><><><><><><><><><><><><><><><><><><><><><><><><> <><><><><><><><><><><><><><><><><><><><><><><><><><><><><><><><><>  -  All Else - CBC - Kidneys - Electrolytes - Liver - Magnesium & Thyroid    - all  Normal / OK  <><><><><><><><><><><><><><><><><><><><><><><><><><><><><><><><><> <><><><><><><><><><><><><><><><><><><><><><><><><><><><><><><><><>

## 2022-01-16 ENCOUNTER — Encounter: Payer: Self-pay | Admitting: Internal Medicine

## 2022-01-16 ENCOUNTER — Ambulatory Visit (INDEPENDENT_AMBULATORY_CARE_PROVIDER_SITE_OTHER): Payer: 59 | Admitting: Internal Medicine

## 2022-01-16 VITALS — BP 132/88 | HR 65 | Temp 97.7°F | Ht 76.0 in | Wt 257.4 lb

## 2022-01-16 DIAGNOSIS — I10 Essential (primary) hypertension: Secondary | ICD-10-CM

## 2022-01-16 DIAGNOSIS — F418 Other specified anxiety disorders: Secondary | ICD-10-CM

## 2022-01-16 DIAGNOSIS — E538 Deficiency of other specified B group vitamins: Secondary | ICD-10-CM | POA: Diagnosis not present

## 2022-01-16 DIAGNOSIS — N529 Male erectile dysfunction, unspecified: Secondary | ICD-10-CM | POA: Diagnosis not present

## 2022-01-16 NOTE — Patient Instructions (Signed)

## 2022-01-16 NOTE — Progress Notes (Signed)
Future Appointments  Date Time Provider Department  01/16/2022  4:00 PM Lucky Cowboy, MD [facility]  04/03/2022  4:00 PM Adela Glimpse, NP [facility]  07/03/2022  4:00 PM Lucky Cowboy, MD [facility]  01/01/2023  3:00 PM Lucky Cowboy, MD [facility]    History of Present Illness:     This very nice 46 y.o.  MWM  presents for a Screening /Preventative Visit & comprehensive evaluation and management of multiple medical co-morbidities.  Patient has been followed for HTN, HLD, Prediabetes and Vitamin D Deficiency. Patient has GERD controlled on his meds.                                        Patient also relates he returns after recent hospitalization in [place] ( or [place] ) from a 90 day Rehab program for Detox /Rehab program from alcohol & has only been home about 12-13 days. His meds have changed & we discussed adding Depade (Naltrexone) . Patient reports that he has done well since last OV & actually returned to work on 01/10/2022.    Recent labs found  -  Testosterone was very high from recent shot, So recommend cut dose in 1/2 to 1/2 ml every week   -  Vitamin B12 =   401  Very Low  & was recommended to take SL Vit B12.  (Ideal or Goal Vit B12 is between 450 - 1,100)     Medications    testosterone cypionate (DEPOTESTOSTERONE CYPIONATE) 200 MG/ML injection, Inject 1 ml into Muscle every 7 days   bisoprolol (ZEBETA) 10 MG tablet, TAKE 1 TABLET DAILY FOR BLOOD PRESSURE   enalapril (VASOTEC) 20 MG tablet, TAKE 2 TABLETS DAILY FOR BLOOD PRESSURE   hydrochlorothiazide (HYDRODIURIL) 25 MG tablet, Take 1 tablet every Morning for BP & Fluid Retention   propranolol (INDERAL) 10 MG tablet, Take  1 tablet  3 to 4 x /day  as needed for Acute Anxiety or Panic Attack   rosuvastatin (CRESTOR) 20 MG tablet, TAKE 1 TABLET 3 DAYS PER   WEEK    busPIRone (BUSPAR) 10 MG tablet, Take 1 tablet 2 to 3 x /day for Anxiety   cholecalciferol (VITAMIN D) 1000 UNITS tablet, Take 1,000  Units by mouth daily. Take 5000 IU daily.   hydrOXYzine (ATARAX) 25 MG tablet, Take  1 tablet  2 to 3 x /day  as needed for Anxiety   Magnesium 400 MG CAPS, Take 1 capsule by mouth daily.   naltrexone (DEPADE) 50 MG tablet, Take  1 tablet  Daily  to Suppress Alcohol Cravings   Omega-3 Fatty Acids (FISH OIL) 1000 MG CAPS, Take 2,000 mg by mouth daily.   omeprazole (PRILOSEC) 40 MG capsule, TAKE 1 CAPSULE DAILY   sertraline (ZOLOFT) 100 MG tablet, Take 1/2 top 1 tablet Daily for Mood & Anxiety   traZODone (DESYREL) 150 MG tablet, Take  1/2 to 1 tablet  1 to 2 hours  before Bedtime  as needed for Sleep  Problem list  He has Essential hypertension; Hyperlipidemia; Testosterone Deficiency; Vitamin D deficiency; Depression with anxiety; Overweight (BMI 25.0-29.9); Other abnormal glucose; Medication management; Esophageal reflux; Sleep apnea; Upper abdominal pain; and Elevated LFTs on their problem list.   Observations/Objective:  BP 132/88   Pulse 65   Temp 97.7 F (36.5 C)   Ht 6\' 4"  (1.93 m)   Wt 257 lb 6.4 oz (  116.8 kg)   SpO2 98%   BMI 31.33 kg/m   HEENT - WNL. Neck - supple.  Chest - Clear equal BS. Cor - Nl HS. RRR w/o sig MGR. PP 1(+). No edema. MS- FROM w/o deformities.  Gait Nl. Neuro -  Nl w/o focal abnormalities.   Assessment and Plan:   1. Essential hypertension   2. Vitamin B12 deficiency   3. Testosterone Deficiency   4. Depression with anxiety    Follow Up Instructions:       Advised to continue all meds same & likewise continue with his counseling & AA meetings.            I discussed the assessment and treatment plan with the patient. The patient was provided an opportunity to ask questions and all were answered. The patient agreed with the plan and demonstrated an understanding of the instructions.       The patient was advised to call back or seek an in-person evaluation if the symptoms worsen or if the condition fails to improve as  anticipated.    Ian Maw, MD

## 2022-01-23 ENCOUNTER — Other Ambulatory Visit: Payer: Self-pay | Admitting: Nurse Practitioner

## 2022-03-06 ENCOUNTER — Other Ambulatory Visit: Payer: Self-pay | Admitting: Internal Medicine

## 2022-03-09 ENCOUNTER — Telehealth (HOSPITAL_COMMUNITY): Payer: Self-pay | Admitting: Licensed Clinical Social Worker

## 2022-03-09 NOTE — Telephone Encounter (Signed)
The therapist attempts to return Mcclain's call as he was referred by [facility] EAP. The therapist leaves a HIPAA-compliant voicemail.  Adam Phenix, [place], LCSW, [facility], [place] 03/09/2022

## 2022-03-13 ENCOUNTER — Telehealth (HOSPITAL_COMMUNITY): Payer: Self-pay | Admitting: Licensed Clinical Social Worker

## 2022-03-13 NOTE — Telephone Encounter (Signed)
The therapist returns Ian Zavala's calls confirming his identity via two identifiers. The therapist provides him of an overview of the [facility] IOP. He says that he and his wife are leaving to go on a Christian retreat this weekend and will not be back until extremely late on Thursday, 03/23/22.  Thus, he opts to schedule to come in for a CCA on 03/27/22 to possibly start the [facility] IOP.  Adam Phenix, [place], LCSW, [facility], [place] 03/13/2022

## 2022-03-14 ENCOUNTER — Other Ambulatory Visit: Payer: Self-pay | Admitting: Nurse Practitioner

## 2022-03-14 ENCOUNTER — Telehealth (HOSPITAL_COMMUNITY): Payer: Self-pay | Admitting: Licensed Clinical Social Worker

## 2022-03-14 NOTE — Telephone Encounter (Signed)
The therapist returns Ian Zavala's call confirming his identity via two identifiers. He says that the EAP recommended that he push up his assessment sooner as it would not look good in regard to his return to work if he were not in any kind of treatment for two weeks. The EAP Representative suggested that with the assessment done that he could start immediately upon his return from his trip.  The therapist informs him that he prefers to wait to do the assessment just prior to [name]'s starting group noting that with people in early recovery that a lot can change within two weeks. Thus, if his EAP has okayed his taking this trip, then they will apparently need to be okay with the assessment waiting until his return.   He is to call this therapist back if he has any questions or concerns.  Adam Phenix, [place], LCSW, [facility], [place] 03/14/2022

## 2022-03-17 ENCOUNTER — Other Ambulatory Visit: Payer: Self-pay | Admitting: Internal Medicine

## 2022-03-20 ENCOUNTER — Other Ambulatory Visit: Payer: Self-pay | Admitting: Internal Medicine

## 2022-03-20 DIAGNOSIS — F1090 Alcohol use, unspecified, uncomplicated: Secondary | ICD-10-CM

## 2022-03-20 MED ORDER — ACAMPROSATE CALCIUM 333 MG PO TBEC: DELAYED_RELEASE_TABLET | ORAL | 1 refills | Status: DC

## 2022-03-27 ENCOUNTER — Encounter (HOSPITAL_COMMUNITY): Payer: Self-pay

## 2022-03-27 ENCOUNTER — Ambulatory Visit (INDEPENDENT_AMBULATORY_CARE_PROVIDER_SITE_OTHER): Payer: 59 | Admitting: Licensed Clinical Social Worker

## 2022-03-27 DIAGNOSIS — F1321 Sedative, hypnotic or anxiolytic dependence, in remission: Secondary | ICD-10-CM

## 2022-03-27 DIAGNOSIS — F17200 Nicotine dependence, unspecified, uncomplicated: Secondary | ICD-10-CM

## 2022-03-27 DIAGNOSIS — F32A Depression, unspecified: Secondary | ICD-10-CM

## 2022-03-27 DIAGNOSIS — F1020 Alcohol dependence, uncomplicated: Secondary | ICD-10-CM | POA: Diagnosis not present

## 2022-03-27 NOTE — Progress Notes (Signed)
Comprehensive Clinical Assessment (CCA) Note  03/27/2022 Ian Zavala. Ian Zavala [id number]  Chief Complaint:  Chief Complaint  Patient presents with   Addiction Problem   Visit Diagnosis: Alcohol Dependence, Severe; Sedative Dependence, Severe, in early remission; Tobacco Use Disorder; and Unspecified Depression  CCA Screening, Triage and Referral (STR)  Patient Reported Information How did you hear about [place]? -- ([employer] E[facility])  Referral name: No data recorded Referral phone number: No data recorded  Whom do you see for routine medical problems? Primary Care  Practice/Facility Name: No data recorded Practice/Facility Phone Number: No data recorded Name of Contact: No data recorded Contact Number: No data recorded Contact Fax Number: No data recorded Prescriber Name: Dr Unk Pinto  Prescriber Address (if known): [address], [place]   What Is the Reason for Your Visit/Call Today? referred by EAP for CD IOP  How Long Has This Been Causing You Problems? > than 6 months  What Do You Feel Would Help You the Most Today? Alcohol or Drug Use Treatment   Have You Recently Been in Any Inpatient Treatment (Hospital/Detox/Crisis Center/28-Day Program)? Yes  Name/Location of Program/Hospital:[facility] in [place], [place]  How Long Were You There? 30 days  When Were You Discharged? 03/06/22   Have You Ever Received Services From [employer] Before? Yes  Who Do You See at [facility]? No data recorded  Have You Recently Had Any Thoughts About Hurting Yourself? Yes  Are You Planning to Commit Suicide/Harm Yourself At This time? No   Have you Recently Had Thoughts About [place]? No  Explanation: No data recorded  Have You Used Any Alcohol or Drugs in the Past 24 Hours? No  How Long Ago Did You Use Drugs or Alcohol? No data recorded What Did You Use and How Much? No data recorded  Do You Currently Have a Therapist/Psychiatrist?  No  Name of Therapist/Psychiatrist: No data recorded  Have You Been Recently Discharged From Any Office Practice or Programs? No  Explanation of Discharge From Practice/Program: No data recorded    CCA Screening Triage Referral Assessment Type of Contact: Face-to-Face  Is this Initial or Reassessment? No data recorded Date Telepsych consult ordered in [facility]:  No data recorded Time Telepsych consult ordered in [facility]:  No data recorded  Patient Reported Information Reviewed? No data recorded Patient Left Without Being Seen? No data recorded Reason for Not Completing Assessment: No data recorded  Collateral Involvement: No data recorded  Does Patient Have a [place]? No data recorded Name and Contact of Legal Guardian: No data recorded If Minor and Not Living with Parent(s), Who has Custody? No data recorded Is CPS involved or ever been involved? Never  Is APS involved or ever been involved? Never   Patient Determined To Be At Risk for Harm To Self or Others Based on Review of Patient Reported Information or Presenting Complaint? No  Method: No Plan  Availability of Means: No data recorded Intent: No data recorded Notification Required: No data recorded Additional Information for Danger to Others Potential: No data recorded Additional Comments for Danger to Others Potential: No data recorded Are There Guns or Other Weapons in Your Home? No  Types of Guns/Weapons: No data recorded Are These Weapons Safely Secured?                            No data recorded Who Could Verify You Are Able To Have These Secured: No  data recorded Do You Have any Outstanding Charges, Pending Court Dates, Parole/Probation? No data recorded Contacted To Inform of Risk of Harm To Self or Others: No data recorded  Location of Assessment: [facility]   Does Patient Present under Involuntary Commitment? No  IVC Papers Initial File Date: No data recorded  [place]  of Residence: Other (Comment) Monia Pouch)   Patient Currently Receiving the Following Services: No data recorded  Determination of Need: Urgent (48 hours)   Options For Referral: Chemical Dependency Intensive Outpatient Therapy (CDIOP); Medication Management; Partial Hospitalization     CCA Biopsychosocial Intake/Chief Complaint:  "It is required that I do this for work," "but I also need to be in some kind of program" as he has gone 52 days without using but believes that he needs more treatment.  Current Symptoms/Problems: He is still fighing the desire to use; he is moderately severely depressed per his PHQ-9. He has thoughts of using when he drives to [place] as there are triggers here.   Patient Reported Schizophrenia/Schizoaffective Diagnosis in Past: No   Strengths: is an "honest person" who has "compassion for other people" and is "reliable and loving"  Preferences: No data recorded Abilities: No data recorded  Type of Services Patient Feels are Needed: If it were up to Elberta Fortis, he would think that he needs to see a therapist and a psychiatrist.   Initial Clinical Notes/Concerns: No data recorded  Mental Health Symptoms Depression:   -- (moderately severely depressed per PHQ-9)   Duration of Depressive symptoms: No data recorded  Mania:   N/A   Anxiety:    Worrying (worries "all the time about almost everything" his entire life)   Psychosis:   None   Duration of Psychotic symptoms: No data recorded  Trauma:   N/A   Obsessions:   N/A   Compulsions:   N/A   Inattention:   None   Hyperactivity/Impulsivity:   None   Oppositional/Defiant Behaviors:   None   Emotional Irregularity:   N/A   Other Mood/Personality Symptoms:  No data recorded   Mental Status Exam Appearance and self-care  Stature:   Tall   Weight:   Average weight   Clothing:   Casual   Grooming:   Normal   Cosmetic use:   None   Posture/gait:   Normal   Motor  activity:   Not Remarkable   Sensorium  Attention:   Normal   Concentration:   Normal   Orientation:   X5   Recall/memory:   Normal   Affect and Mood  Affect:   Depressed   Mood:   Depressed; Anxious   Relating  Eye contact:   None   Facial expression:   Depressed   Attitude toward examiner:   Cooperative   Thought and Language  Speech flow:  Clear and Coherent   Thought content:   Appropriate to Mood and Circumstances   Preoccupation:   None   Hallucinations:   None   Organization:  No data recorded  [employer] of Knowledge:   Good   Intelligence:   Average   Abstraction:   Abstract   Judgement:   Normal   Reality Testing:   Adequate   Insight:   Fair   Decision Making:   Normal   Social Functioning  Social Maturity:  No data recorded  Social Judgement:   Normal   Stress  Stressors:   Family conflict; Financial; Work (not talking with his momwho  was verbally abusive towards Ian Zavala; Ian Zavala's maternal grandmother was an alcoholic such that his mom grew up in an orphanage because of this)   Coping Ability:   Overwhelmed   Skill Deficits:   None   Supports:   Other (Comment); Family (wife and her parents are supportive; has been going to a few AA  meetings but has no Publishing copy; his wife's parents don't really understand telling him that if he went to [facility] that he would be okay)     Religion: Religion/Spirituality Are You A Religious Person?: Yes What is Your Religious Affiliation?: Christian How Might This Affect Treatment?: God is his Higher Power and it is very important to him  Leisure/Recreation: Leisure / [place]?: Yes Leisure and Hobbies: movies, music, and sports; he likes the outdoors and hiking and camping  Exercise/Diet: Exercise/Diet Do You Exercise?: No (exercised up until the past 6 months; was going to the gym) Have You Gained or Lost A Significant Amount of Weight in  the Past Six Months?: No Do You Follow a Special Diet?: No Do You Have Any Trouble Sleeping?: No (sleeping about 10 hours per night; a lot of days wants to stay in bed and watch tv)   CCA Employment/Education Employment/Work Situation: Employment / Work Situation Employment Situation: Leave of absence (on a leave until he completes "this program") Patient's Job has Been Impacted by Current Illness: Yes Describe how Patient's Job has Been Impacted: he was missing a lot of work because he was drinking which started after [place]; he "spiraled out of control this summer" so called his EAP and was sent to Rehab for 3 months; he came home and went back to work for a month or so and relapsed and could not go back to work so called EAP again What is the Longest Time Patient has Held a Job?: 27 years Where was the Patient Employed at that Time?: current job Has Patient ever Been in the [employer]?: No  Education: Education Is Patient Currently Attending School?: No Last Grade Completed: 12 Name of [place]: [place] Did Teacher, adult education From [employer]?: Yes Did Physicist, medical?: No Did You Attend Graduate School?: No Did You Have An Individualized Education Program (IIEP): No Did You Have Any Difficulty At School?: Yes (was bullied all through school starting in middle school for "being tall and goofy" and his personality is "passive and quiet") Were Any Medications Ever Prescribed For These Difficulties?: No Patient's Education Has Been Impacted by Current Illness: No   CCA Family/Childhood History Family and Relationship History: Family history Marital status: Married Number of Years Married: 55 What types of issues is patient dealing with in the relationship?: has had a "great relationship" but the alcohol has "been a problem" and he has been gone for 4 months in treatment which has created "communication issues;" his wife attends [employer] Are you sexually active?: Yes What  is your sexual orientation?: Heterosexual Has your sexual activity been affected by drugs, alcohol, medication, or emotional stress?: Yes Does patient have children?: Yes How many children?: 2 How is patient's relationship with their children?: has two kids ages 47 and 31 saying his relationship is good but not great as he has been so unavailable between work and his alcohol use causing him to isolate in his room  Childhood History:  Childhood History By whom was/is the patient raised?: Both parents Additional childhood history information: Ian Zavala is from [place] raised by both parents; he says that  his dad was "good" but was hardly home due to working all the time; his mom was "verbally abusive, "demeaning," and "manipulative; his maternal grandmother was an alcoholic and maternal grandfather died when his mom was a baby Description of patient's relationship with caregiver when they were a child: not good with mom but a good relationship with his father who passed away from cancer in 06/21/07; his father left his mom when Ian Zavala was about 34 years-old Patient's description of current relationship with people who raised him/her: not talking with mom; father is deceased How were you disciplined when you got in trouble as a child/adolescent?: spanked or hit Does patient have siblings?: Yes Number of Siblings: 2 Description of patient's current relationship with siblings: he has one brother and one sister saying that his relationship with them is "decent" but he hardly sees them Did patient suffer any verbal/emotional/physical/sexual abuse as a child?: Yes Did patient suffer from severe childhood neglect?: No Has patient ever been sexually abused/assaulted/raped as an adolescent or adult?: No Was the patient ever a victim of a crime or a disaster?: No Witnessed domestic violence?: No Has patient been affected by domestic violence as an adult?: No  Child/Adolescent Assessment:     CCA Substance  Use Alcohol/Drug Use: Alcohol / Drug Use Pain Medications: No pain medications Prescriptions: see discharge [facility] from [facility]; he is taking all but the Clonidine Over the Counter: None History of alcohol / drug use?: Yes Longest period of sobriety (when/how long): 125 days last year Negative Consequences of Use: Work / Programmer, multimedia, Copywriter, advertising relationships, Surveyor, quantity Withdrawal Symptoms: Tremors, Change in blood pressure, Agitation, Irritability, Nausea / Vomiting, Sweats, Tachycardia Substance #1 Name of Substance 1: Alcohol 1 - Age of First Use: 21 1 - Amount (size/oz): 12 pack per day up to 20 beers per day 1 - Frequency: daily 1 - Duration: has always been drinking since age 33; there were times he would go a day or two without drinking and went weeks without drinking but it was "20 years ago" 1 - Last Use / Amount: 02/02/2022 1 - Method of Aquiring: legal 1- Route of Use: oral Substance #2 Name of Substance 2: Xanax (used to drink while taking Xanax) 2 - Age of First Use: 25 2 - Amount (size/oz): .5 mg 2 - Frequency: QID 2 - Duration: close to about 20 years 2 - Last Use / Amount: July of last year 2 - Method of Aquiring: prescription 2 - Route of Substance Use: oral Substance #3 Name of Substance 3: Smokeless tobacco 3 - Age of First Use: 12-15 years 3 - Amount (size/oz): five per day 3 - Frequency: daily 3 - Duration: 15 years 3 - Last Use / Amount: today 3 - Method of Aquiring: legal 3 - Route of Substance Use: oral                   ASAM's:  Six Dimensions of Multidimensional Assessment  Dimension 1:  Acute Intoxication and/or Withdrawal Potential:   Dimension 1:  Description of individual's past and current experiences of substance use and withdrawal: No risk of withdrawal  Dimension 2:  Biomedical Conditions and Complications:   Dimension 2:  Description of patient's biomedical conditions and  complications: HTN, High Cholesterol, and Sleep  Apnea (wears a C-Pap)  Dimension 3:  Emotional, Behavioral, or Cognitive Conditions and Complications:  Dimension 3:  Description of emotional, behavioral, or cognitive conditions and complications: moderate depression; diagnosed with Major Depression in treatment  Dimension 4:  Readiness to Change:  Dimension 4:  Description of Readiness to Change criteria: rates his desire to quit as an 8 on a scale of 1-10 with 10 being most wanting to quit; the part that makes him reluctant to quit is that he had a good time when he used to use and would think he could control  Dimension 5:  Relapse, Continued use, or Continued Problem Potential:  Dimension 5:  Relapse, continued use, or continued problem potential critiera description: He has never had more than 120 something days of sobriety and is attending AA but has yet to get a Sponsor  Dimension 6:  Recovery/Living Environment:  Dimension 6:  Recovery/Iiving environment criteria description: His wife does not drink or use drugs and has a "zero tolerance" for it; he has friends who did use and does not plan on seeing them; his wife is friends with wives of his using friends and it is hard to get his wife to understand that he needs to break these ties; he says that his job is a stressor saying that his co-workers are selfish and take advantage of him; he says that everything about Ginette Otto is a Insurance account manager Severity Score: ASAM's Severity Rating Score: 8  ASAM Recommended Level of Treatment: ASAM Recommended Level of Treatment: Level II Intensive Outpatient Treatment   Substance use Disorder (SUD) Substance Use Disorder (SUD)  Checklist Symptoms of Substance Use: Presence of craving or strong urge to use, Persistent desire or unsuccessful efforts to cut down or control use, Evidence of withdrawal (Comment), Evidence of tolerance, Social, occupational, recreational activities given up or reduced due to use, Substance(s) often taken in larger amounts or over  longer times than was intended, Recurrent use that results in a failure to fulfill major role obligations (work, school, home), Large amounts of time spent to obtain, use or recover from the substance(s), Continued use despite persistent or recurrent social, interpersonal problems, caused or exacerbated by use, Repeated use in physically hazardous situations, Continued use despite having a persistent/recurrent physical/psychological problem caused/exacerbated by use  Recommendations for Services/Supports/Treatments: Recommendations for Services/Supports/Treatments Recommendations For Services/Supports/Treatments: CD-IOP Intensive Chemical Dependency Program  DSM5 Diagnoses: Patient Active Problem List   Diagnosis Date Noted   Sleep apnea 11/29/2017   Esophageal reflux 04/06/2015   Other abnormal glucose 07/15/2013   Overweight (BMI 25.0-29.9)    Essential hypertension 01/09/2013   Hyperlipidemia 01/09/2013   Testosterone Deficiency 01/09/2013   Vitamin D deficiency 01/09/2013   Depression with anxiety 01/09/2013    Patient Centered Plan: Patient is on the following Treatment Plan(s):  Substance Abuse; Ian Zavala gives permission for this therapist to electronically sign for him.   Referrals to Alternative Service(s): Referred to Alternative Service(s):   Place:   Date:   Time:    Referred to Alternative Service(s):   Place:   Date:   Time:    Referred to Alternative Service(s):   Place:   Date:   Time:    Referred to Alternative Service(s):   Place:   Date:   Time:      Collaboration of Care: Other ROI obtained for his [employer] E[employer]  Patient/Guardian was advised Release of Information must be obtained prior to any record release in order to collaborate their care with an outside provider. Patient/Guardian was advised if they have not already done so to contact the registration department to sign all necessary forms in order for [place] to release information regarding their care.   Plan: Ian Zavala  to start CD IOP on 03/29/22.  Consent: Patient/Guardian gives verbal consent for treatment and assignment of benefits for services provided during this visit. Patient/Guardian expressed understanding and agreed to proceed.   Myrna Blazer, MA, LCSW, [facility], LCAS 03/27/2022

## 2022-03-28 ENCOUNTER — Telehealth (HOSPITAL_COMMUNITY): Payer: Self-pay | Admitting: Licensed Clinical Social Worker

## 2022-03-28 NOTE — Telephone Encounter (Signed)
The therapist responds to an email from [name] LSW, CEAP, DOT-SAP, CCISM with [employer] EAP with the following:  "Good afternoon,  [name] attended his assessment yesterday and will start CD IOP tomorrow.  Regards,"   Ian Zavala, [place], LCSW, [facility], [place] 03/28/2022

## 2022-03-29 ENCOUNTER — Ambulatory Visit (INDEPENDENT_AMBULATORY_CARE_PROVIDER_SITE_OTHER): Payer: 59 | Admitting: Licensed Clinical Social Worker

## 2022-03-29 ENCOUNTER — Telehealth (HOSPITAL_COMMUNITY): Payer: Self-pay | Admitting: Licensed Clinical Social Worker

## 2022-03-29 VITALS — BP 120/74 | HR 70 | Ht 76.0 in | Wt 256.0 lb

## 2022-03-29 DIAGNOSIS — F1020 Alcohol dependence, uncomplicated: Secondary | ICD-10-CM

## 2022-03-29 DIAGNOSIS — F32A Depression, unspecified: Secondary | ICD-10-CM

## 2022-03-29 DIAGNOSIS — F17200 Nicotine dependence, unspecified, uncomplicated: Secondary | ICD-10-CM | POA: Diagnosis not present

## 2022-03-29 DIAGNOSIS — F99 Mental disorder, not otherwise specified: Secondary | ICD-10-CM

## 2022-03-29 DIAGNOSIS — Z6372 Alcoholism and drug addiction in family: Secondary | ICD-10-CM

## 2022-03-29 DIAGNOSIS — F1321 Sedative, hypnotic or anxiolytic dependence, in remission: Secondary | ICD-10-CM | POA: Diagnosis not present

## 2022-03-29 DIAGNOSIS — F419 Anxiety disorder, unspecified: Secondary | ICD-10-CM

## 2022-03-29 DIAGNOSIS — F341 Dysthymic disorder: Secondary | ICD-10-CM

## 2022-03-29 DIAGNOSIS — F5105 Insomnia due to other mental disorder: Secondary | ICD-10-CM

## 2022-03-29 DIAGNOSIS — E291 Testicular hypofunction: Secondary | ICD-10-CM

## 2022-03-29 NOTE — Progress Notes (Incomplete)
Psychiatric Initial Adult Assessment   Patient Identification: Ian Zavala MRN:  [id number] Date of Evaluation:  03/29/2022 Referral Source: *** Chief Complaint:  No chief complaint on file.  Visit Diagnosis: No diagnosis found.  History of Present Illness:  ***  Associated Signs/Symptoms: Depression Symptoms:  {DEPRESSION SYMPTOMS:20000} (Hypo) Manic Symptoms:  {BHH MANIC SYMPTOMS:22872} Anxiety Symptoms:  {BHH ANXIETY SYMPTOMS:22873} Psychotic Symptoms:  {BHH PSYCHOTIC SYMPTOMS:22874} PTSD Symptoms: {BHH PTSD SYMPTOMS:22875}  Past Psychiatric History: ***  Previous Psychotropic Medications: {YES/NO:21197}  Substance Abuse History in the last 12 months:  {yes no:314532}  Consequences of Substance Abuse: {BHH CONSEQUENCES OF SUBSTANCE ABUSE:22880}  Past Medical History:  Past Medical History:  Diagnosis Date   Anxiety    Depression    GERD (gastroesophageal reflux disease)    Hyperlipidemia    Hypertension    Hypogonadism male    Obesity    OSA on CPAP 2019   Vitamin D deficiency    No past surgical history on file.  Family Psychiatric History: ***  Family History:  Family History  Problem Relation Age of Onset   Hypertension Mother    Depression Mother    Fibromyalgia Mother    Depression Brother    Hypertension Brother    Cancer Father 67       urethral/lung cancer   Depression Sister     Social History:   Social History   Socioeconomic History   Marital status: Married    Spouse name: Not on file   Number of children: Not on file   Years of education: Not on file   Highest education level: Not on file  Occupational History   Not on file  Tobacco Use   Smoking status: Never   Smokeless tobacco: Never  Substance and Sexual Activity   Alcohol use: Yes    Types: 12 drink(s) per week   Drug use: No   Sexual activity: Yes  Other Topics Concern   Not on file  Social History Narrative   Not on file   Social Determinants of Health    Financial Resource Strain: Not on file  Food Insecurity: Not on file  Transportation Needs: Not on file  Physical Activity: Not on file  Stress: Not on file  Social Connections: Not on file    Additional Social History: ***  Allergies:   Allergies  Allergen Reactions   Citalopram Other (See Comments)    anxiety   Pravastatin    Wellbutrin [Bupropion]     Metabolic Disorder Labs: Lab Results  Component Value Date   HGBA1C 5.3 12/26/2021   MPG 105 12/26/2021   MPG 105 07/20/2021   No results found for: "PROLACTIN" Lab Results  Component Value Date   CHOL 187 12/26/2021   TRIG 113 12/26/2021   HDL 46 12/26/2021   CHOLHDL 4.1 12/26/2021   VLDL 46 (H) 06/26/2016   LDLCALC 119 (H) 12/26/2021   LDLCALC 130 (H) 07/20/2021   Lab Results  Component Value Date   TSH 1.88 12/26/2021    Therapeutic Level Labs: No results found for: "LITHIUM" No results found for: "CBMZ" No results found for: "VALPROATE"  Current Medications: Current Outpatient Medications  Medication Sig Dispense Refill   acamprosate (CAMPRAL) 333 MG tablet Take  2 tablets (666 mg)    3 x/day    with Meals     for Alcohol Use Disorder 540 tablet 1   bisoprolol (ZEBETA) 10 MG tablet TAKE 1 TABLET DAILY FOR BLOOD PRESSURE 90 tablet 3  busPIRone (BUSPAR) 10 MG tablet Take 1 tablet 2 to 3 x /day for Anxiety 270 tablet 1   cholecalciferol (VITAMIN D) 1000 UNITS tablet Take 1,000 Units by mouth daily. Take 5000 IU daily.     enalapril (VASOTEC) 20 MG tablet TAKE 2 TABLETS DAILY FOR BLOOD PRESSURE 180 tablet 3   hydrochlorothiazide (HYDRODIURIL) 25 MG tablet TAKE 1 TABLET EVERY MORNINGFOR BLOOD PRESSURE AND     FLUID RETENTION 90 tablet 3   hydrOXYzine (ATARAX) 25 MG tablet Take  1 tablet  2 to 3 x /day  as needed for Anxiety 270 tablet 1   Magnesium 400 MG CAPS Take 1 capsule by mouth daily.     naltrexone (DEPADE) 50 MG tablet Take  1 tablet  Daily  to Suppress Alcohol Cravings 90 tablet 1   Omega-3  Fatty Acids (FISH OIL) 1000 MG CAPS Take 2,000 mg by mouth daily.     omeprazole (PRILOSEC) 40 MG capsule TAKE 1 CAPSULE DAILY TO    PREVENT INDIGESTION AND    HEARTBURN 90 capsule 1   propranolol (INDERAL) 10 MG tablet Take  1 tablet  3 to 4 x /day  as needed for Acute Anxiety or Panic Attack 270 tablet 0   rosuvastatin (CRESTOR) 20 MG tablet TAKE 1 TABLET 3 DAYS PER   WEEK FOR CHOLESTEROL 39 tablet 1   sertraline (ZOLOFT) 100 MG tablet TAKE 1/2 TO 1 TABLET DAILY FOR MOOD AND ANXIETY 90 tablet 1   testosterone cypionate (DEPOTESTOSTERONE CYPIONATE) 200 MG/ML injection Inject 1 ml into Muscle every 7 days 10 mL 1   traZODone (DESYREL) 150 MG tablet Take  1/2 to 1 tablet  1 to 2 hours  before Bedtime  as needed for Sleep 90 tablet 1   No current facility-administered medications for this visit.    Musculoskeletal: Strength & Muscle Tone: {desc; muscle tone:32375} Gait & Station: {PE GAIT ED EF:6704556 Patient leans: {Patient Leans:21022755}  Psychiatric Specialty Exam: Review of Systems  There were no vitals taken for this visit.There is no height or weight on file to calculate BMI.  General Appearance: {Appearance:22683}  Eye Contact:  {BHH EYE CONTACT:22684}  Speech:  {Speech:22685}  Volume:  {Volume (PAA):22686}  Mood:  {BHH MOOD:22306}  Affect:  {Affect (PAA):22687}  Thought Process:  {Thought Process (PAA):22688}  Orientation:  {BHH ORIENTATION (PAA):22689}  Thought Content:  {Thought Content:22690}  Suicidal Thoughts:  {ST/HT (PAA):22692}  Homicidal Thoughts:  {ST/HT (PAA):22692}  Memory:  {BHH MEMORY:22881}  Judgement:  {Judgement (PAA):22694}  Insight:  {Insight (PAA):22695}  Psychomotor Activity:  {Psychomotor (PAA):22696}  Concentration:  {Concentration:21399}  Recall:  {BHH GOOD/FAIR/POOR:22877}  Fund of Knowledge:{BHH GOOD/FAIR/POOR:22877}  Language: {BHH GOOD/FAIR/POOR:22877}  Akathisia:  {BHH YES OR NO:22294}  Handed:  {Handed:22697}  AIMS (if indicated):  {Desc;  done/not:10129}  Assets:  {Assets (PAA):22698}  ADL's:  {BHH XO:4411959  Cognition: {chl bhh cognition:304700322}  Sleep:  {BHH GOOD/FAIR/POOR:22877}   Screenings: Web designer from 03/27/2022 in [facility] Office Visit from 12/26/2021 in [place] [facility] [place] Office Visit from 07/20/2021 in [place] [facility] [place] Office Visit from 05/24/2020 in [place] [facility] [place] Office Visit from 04/06/2015 in [place] [facility]  PHQ-2 Total Score 6 0 0 0 0  PHQ-9 Total Score 17 -- -- -- --      Health and safety inspector from 03/27/2022 in [place] Error: Q7 should not be populated  when Q6 is No       Assessment and Plan: ***  Collaboration of Care: {BH OP Collaboration of Care:21014065}  Patient/Guardian was advised Release of Information must be obtained prior to any record release in order to collaborate their care with an outside provider. Patient/Guardian was advised if they have not already done so to contact the registration department to sign all necessary forms in order for [place] to release information regarding their care.   Consent: Patient/Guardian gives verbal consent for treatment and assignment of benefits for services provided during this visit. Patient/Guardian expressed understanding and agreed to proceed.   [name], PA-C 2/7/202410:53 AM

## 2022-03-29 NOTE — Progress Notes (Incomplete)
Psychiatric Initial Adult Assessment   Patient Identification: Ian Zavala MRN:  [id number] Date of Evaluation:  03/29/2022 Referral Source: *** Chief Complaint:  No chief complaint on file.  Visit Diagnosis: No diagnosis found.  History of Present Illness:  ***  Associated Signs/Symptoms: Depression Symptoms:  {DEPRESSION SYMPTOMS:20000} (Hypo) Manic Symptoms:  {BHH MANIC SYMPTOMS:22872} Anxiety Symptoms:  {BHH ANXIETY SYMPTOMS:22873} Psychotic Symptoms:  {BHH PSYCHOTIC SYMPTOMS:22874} PTSD Symptoms: {BHH PTSD SYMPTOMS:22875}  Past Psychiatric History: ***  Previous Psychotropic Medications: {YES/NO:21197}  Substance Abuse History in the last 12 months:  {yes no:314532}  Consequences of Substance Abuse: {BHH CONSEQUENCES OF SUBSTANCE ABUSE:22880}  Past Medical History:  Past Medical History:  Diagnosis Date  . Anxiety   . Depression   . GERD (gastroesophageal reflux disease)   . Hyperlipidemia   . Hypertension   . Hypogonadism male   . Obesity   . OSA on CPAP 2019  . Vitamin D deficiency    No past surgical history on file.  Family Psychiatric History: ***  Family History:  Family History  Problem Relation Age of Onset  . Hypertension Mother   . Depression Mother   . Fibromyalgia Mother   . Depression Brother   . Hypertension Brother   . Cancer Father 40       urethral/lung cancer  . Depression Sister     Social History:   Social History   Socioeconomic History  . Marital status: Married    Spouse name: Not on file  . Number of children: Not on file  . Years of education: Not on file  . Highest education level: Not on file  Occupational History  . Not on file  Tobacco Use  . Smoking status: Never  . Smokeless tobacco: Never  Substance and Sexual Activity  . Alcohol use: Yes    Types: 12 drink(s) per week  . Drug use: No  . Sexual activity: Yes  Other Topics Concern  . Not on file  Social History Narrative  . Not on file   Social  Determinants of Health   Financial Resource Strain: Not on file  Food Insecurity: Not on file  Transportation Needs: Not on file  Physical Activity: Not on file  Stress: Not on file  Social Connections: Not on file    Additional Social History: ***  Allergies:   Allergies  Allergen Reactions  . Citalopram Other (See Comments)    anxiety  . Pravastatin   . Wellbutrin [Bupropion]     Metabolic Disorder Labs: Lab Results  Component Value Date   HGBA1C 5.3 12/26/2021   MPG 105 12/26/2021   MPG 105 07/20/2021   No results found for: "PROLACTIN" Lab Results  Component Value Date   CHOL 187 12/26/2021   TRIG 113 12/26/2021   HDL 46 12/26/2021   CHOLHDL 4.1 12/26/2021   VLDL 46 (H) 06/26/2016   LDLCALC 119 (H) 12/26/2021   LDLCALC 130 (H) 07/20/2021   Lab Results  Component Value Date   TSH 1.88 12/26/2021    Therapeutic Level Labs: No results found for: "LITHIUM" No results found for: "CBMZ" No results found for: "VALPROATE"  Current Medications: Current Outpatient Medications  Medication Sig Dispense Refill  . acamprosate (CAMPRAL) 333 MG tablet Take  2 tablets (666 mg)    3 x/day    with Meals     for Alcohol Use Disorder 540 tablet 1  . bisoprolol (ZEBETA) 10 MG tablet TAKE 1 TABLET DAILY FOR BLOOD PRESSURE 90 tablet 3  .  busPIRone (BUSPAR) 10 MG tablet Take 1 tablet 2 to 3 x /day for Anxiety 270 tablet 1  . cholecalciferol (VITAMIN D) 1000 UNITS tablet Take 1,000 Units by mouth daily. Take 5000 IU daily.    . enalapril (VASOTEC) 20 MG tablet TAKE 2 TABLETS DAILY FOR BLOOD PRESSURE 180 tablet 3  . hydrochlorothiazide (HYDRODIURIL) 25 MG tablet TAKE 1 TABLET EVERY MORNINGFOR BLOOD PRESSURE AND     FLUID RETENTION 90 tablet 3  . hydrOXYzine (ATARAX) 25 MG tablet Take  1 tablet  2 to 3 x /day  as needed for Anxiety 270 tablet 1  . Magnesium 400 MG CAPS Take 1 capsule by mouth daily.    . naltrexone (DEPADE) 50 MG tablet Take  1 tablet  Daily  to Suppress Alcohol  Cravings 90 tablet 1  . Omega-3 Fatty Acids (FISH OIL) 1000 MG CAPS Take 2,000 mg by mouth daily.    Marland Kitchen omeprazole (PRILOSEC) 40 MG capsule TAKE 1 CAPSULE DAILY TO    PREVENT INDIGESTION AND    HEARTBURN 90 capsule 1  . propranolol (INDERAL) 10 MG tablet Take  1 tablet  3 to 4 x /day  as needed for Acute Anxiety or Panic Attack 270 tablet 0  . rosuvastatin (CRESTOR) 20 MG tablet TAKE 1 TABLET 3 DAYS PER   WEEK FOR CHOLESTEROL 39 tablet 1  . sertraline (ZOLOFT) 100 MG tablet TAKE 1/2 TO 1 TABLET DAILY FOR MOOD AND ANXIETY 90 tablet 1  . testosterone cypionate (DEPOTESTOSTERONE CYPIONATE) 200 MG/ML injection Inject 1 ml into Muscle every 7 days 10 mL 1  . traZODone (DESYREL) 150 MG tablet Take  1/2 to 1 tablet  1 to 2 hours  before Bedtime  as needed for Sleep 90 tablet 1   No current facility-administered medications for this visit.    Musculoskeletal: Strength & Muscle Tone: {desc; muscle tone:32375} Gait & Station: {PE GAIT ED YCXK:48185} Patient leans: {Patient Leans:21022755}  Psychiatric Specialty Exam: Review of Systems  Constitutional:  Positive for activity change. Negative for appetite change, chills, diaphoresis, fatigue, fever and unexpected weight change.  HENT: Negative.    Eyes: Negative.   Respiratory: Negative.    Cardiovascular:  Negative for chest pain, palpitations and leg swelling.       Hypertensive since age 58  Gastrointestinal: Negative.   Endocrine: Negative for cold intolerance, heat intolerance, polydipsia, polyphagia and polyuria.  Genitourinary: Negative.   Musculoskeletal:  Negative for arthralgias, back pain, gait problem, joint swelling, myalgias and neck pain.  Skin:  Negative for color change, pallor, rash and wound.  Allergic/Immunologic: Negative for environmental allergies, food allergies and immunocompromised state.  Neurological:  Negative for dizziness, tremors, seizures, syncope, facial asymmetry, speech difficulty, weakness, light-headedness,  numbness and headaches.  Hematological: Negative.   Psychiatric/Behavioral:  Positive for agitation, decreased concentration, dysphoric mood and sleep disturbance. Negative for behavioral problems, confusion, hallucinations, self-injury and suicidal ideas. The patient is nervous/anxious. The patient is not hyperactive.     There were no vitals taken for this visit.There is no height or weight on file to calculate BMI.  General Appearance: {Appearance:22683}  Eye Contact:  {BHH EYE CONTACT:22684}  Speech:  {Speech:22685}  Volume:  {Volume (PAA):22686}  Mood:  {BHH MOOD:22306}  Affect:  {Affect (PAA):22687}  Thought Process:  {Thought Process (PAA):22688}  Orientation:  {BHH ORIENTATION (PAA):22689}  Thought Content:  {Thought Content:22690}  Suicidal Thoughts:  {ST/HT (PAA):22692}  Homicidal Thoughts:  {ST/HT (PAA):22692}  Memory:  {BHH UDJSHF:02637}  Judgement:  {Judgement (PAA):22694}  Insight:  {Insight (PAA):22695}  Psychomotor Activity:  {Psychomotor (PAA):22696}  Concentration:  {Concentration:21399}  Recall:  {BHH GOOD/FAIR/POOR:22877}  Fund of Knowledge:{BHH GOOD/FAIR/POOR:22877}  Language: {BHH GOOD/FAIR/POOR:22877}  Akathisia:  {BHH YES OR NO:22294}  Handed:  {Handed:22697}  AIMS (if indicated):  {Desc; done/not:10129}  Assets:  {Assets (PAA):22698}  ADL's:  {BHH GBE'E:10071}  Cognition: {chl bhh cognition:304700322}  Sleep:  {BHH GOOD/FAIR/POOR:22877}   Screenings: Web designer from 03/27/2022 in [facility] Office Visit from 12/26/2021 in [place] [facility] [place] Office Visit from 07/20/2021 in [place] [facility] [place] Office Visit from 05/24/2020 in [place] [facility] [place] Office Visit from 04/06/2015 in [place] [facility]  PHQ-2 Total Score 6 0 0 0 0  PHQ-9 Total Score 17 -- -- -- --      Health and safety inspector from 03/27/2022  in [place] Error: Q7 should not be populated when Q6 is No       Assessment and Plan: ***  Collaboration of Care: {BH OP Collaboration of Care:21014065}  Patient/Guardian was advised Release of Information must be obtained prior to any record release in order to collaborate their care with an outside provider. Patient/Guardian was advised if they have not already done so to contact the registration department to sign all necessary forms in order for [place] to release information regarding their care.   Consent: Patient/Guardian gives verbal consent for treatment and assignment of benefits for services provided during this visit. Patient/Guardian expressed understanding and agreed to proceed.   [name], PA-C 2/7/202410:09 AM

## 2022-03-29 NOTE — Telephone Encounter (Signed)
The therapist faxes the Supplemental Doctor's Statement and [employer] Provider's Statement of Disability to [name].  Adam Phenix, [place], LCSW, [facility], [place] 03/29/2022

## 2022-03-29 NOTE — Progress Notes (Signed)
Daily Group Progress Note   Program: [facility]     Individual Time: 9 a.m. to 12 p.m.   Type of Therapy: Process and Psychoeducational    Topic: The therapist checks in with group members, assesses for SI/HI/psychosis and overall level of functioning. The therapist inquires about sobriety date and number of community support meetings attended since last session.    The therapist introduces a new group member and has existing members share their stories of how they came to be in [facility]. The therapist again talks about how assertiveness connects to recovery and how it helps people to avoid entering emotional relapse via limit setting. He reviews the stages of relapse explaining why the program emphasizes picking up the phone and reaching out to people even when not thinking of using. He explains the brain-based reasons that people in early recovery must be especially diligent about avoiding triggers and on being smart not strong. He also discusses that research has determined that recovery takes place in fellowship and that having a non-using support system and using it is imperative.    Summary: Ian Zavala presents rating his depression as a "5" and her anxiety as a "5."    He has been to no meetings saying that his entire family is sick except for him but then adding that this is "not a good excuse." He describes his mood a "interested" and "trusting" and is mostly quiet but attentive throughout most of this initial group and thanks each person after he or she shares his or her story of how they came to be in this program.   At the conclusion of the group, he says that it was a positive experience for him and that he looks forward to returning on Friday. Time runs out before Ian Zavala and another member are able to share their stories.    Progress Towards Goals: Ian Zavala reports no alcohol use.    UDS collected: Yes Results: No   AA/NA attended?: No   Sponsor?: No   Ian Zavala, [place], LCSW, [facility],  [facility] 03/30/2022

## 2022-03-31 ENCOUNTER — Ambulatory Visit (INDEPENDENT_AMBULATORY_CARE_PROVIDER_SITE_OTHER): Payer: 59 | Admitting: Licensed Clinical Social Worker

## 2022-03-31 DIAGNOSIS — F1321 Sedative, hypnotic or anxiolytic dependence, in remission: Secondary | ICD-10-CM

## 2022-03-31 DIAGNOSIS — F1020 Alcohol dependence, uncomplicated: Secondary | ICD-10-CM | POA: Diagnosis not present

## 2022-03-31 DIAGNOSIS — F32A Depression, unspecified: Secondary | ICD-10-CM

## 2022-03-31 DIAGNOSIS — F17200 Nicotine dependence, unspecified, uncomplicated: Secondary | ICD-10-CM

## 2022-03-31 NOTE — Progress Notes (Signed)
Daily Group Progress Note   Program: [facility]     Individual Time: 9 a.m. to 12 p.m.   Type of Therapy: Process and Psychoeducational    Topic: The therapist checks in with group members, assesses for SI/HI/psychosis and overall level of functioning. The therapist inquires about sobriety date and number of community support meetings attended since last session.    The therapist introduces a new group member and, again, has existing members share their stories of how they came to be in [facility]. The therapist facilitates discussion on what a home group  is, what is meant by a burning desire, why Sponsors have to the the same sex as Sponsees, what is meant by the phrase, "13th stepping," and what is meant by the "pink cloud." The therapist all talks about various phrases that are forms of denial such as "it's all natural." The therapist continues to explain the reason that addiction is an illness and not a character defect and normalizes people being nervous when they first come to treatment noting that many people with addiction do not initially like or trust people and many have issues with social anxiety that using substances alleviates.    Summary: Ian Zavala presents rating his depression as a "3" and her anxiety as a "3."    He reports having the same sobriety date and attended one, on-line meeting saying that he did not like it as he prefers to attend in person. He says that the key for him is finding meetings in which the people are "real." He says that he is "hopeful" about what the "next chapter will bring."  Ian Zavala talks about his mother being raised by an alcoholic and ending up in an orphanage and details how he has problems with Xanax and alcohol and how his use spiraled out of control such that he could go on a binge and miss a week of work at a time. He admits to having a lot of social anxiety like the other new member.    Progress Towards Goals: Ian Zavala reports no alcohol use.    UDS collected:  No Results: No   AA/NA attended?: Yes   Sponsor?: No   Adam Phenix, [place], LCSW, [facility], [facility] 03/31/2022

## 2022-04-01 ENCOUNTER — Other Ambulatory Visit: Payer: Self-pay | Admitting: Internal Medicine

## 2022-04-03 ENCOUNTER — Encounter: Payer: Self-pay | Admitting: Nurse Practitioner

## 2022-04-03 ENCOUNTER — Ambulatory Visit (INDEPENDENT_AMBULATORY_CARE_PROVIDER_SITE_OTHER): Payer: 59 | Admitting: Licensed Clinical Social Worker

## 2022-04-03 ENCOUNTER — Ambulatory Visit (INDEPENDENT_AMBULATORY_CARE_PROVIDER_SITE_OTHER): Payer: 59 | Admitting: Nurse Practitioner

## 2022-04-03 VITALS — BP 112/76 | HR 67 | Temp 97.7°F | Ht 76.0 in | Wt 257.4 lb

## 2022-04-03 DIAGNOSIS — F32A Depression, unspecified: Secondary | ICD-10-CM

## 2022-04-03 DIAGNOSIS — F418 Other specified anxiety disorders: Secondary | ICD-10-CM

## 2022-04-03 DIAGNOSIS — E663 Overweight: Secondary | ICD-10-CM

## 2022-04-03 DIAGNOSIS — F1090 Alcohol use, unspecified, uncomplicated: Secondary | ICD-10-CM | POA: Diagnosis not present

## 2022-04-03 DIAGNOSIS — F1020 Alcohol dependence, uncomplicated: Secondary | ICD-10-CM

## 2022-04-03 DIAGNOSIS — I10 Essential (primary) hypertension: Secondary | ICD-10-CM | POA: Diagnosis not present

## 2022-04-03 DIAGNOSIS — F17200 Nicotine dependence, unspecified, uncomplicated: Secondary | ICD-10-CM

## 2022-04-03 DIAGNOSIS — F1321 Sedative, hypnotic or anxiolytic dependence, in remission: Secondary | ICD-10-CM

## 2022-04-03 DIAGNOSIS — E559 Vitamin D deficiency, unspecified: Secondary | ICD-10-CM

## 2022-04-03 DIAGNOSIS — R7309 Other abnormal glucose: Secondary | ICD-10-CM | POA: Diagnosis not present

## 2022-04-03 DIAGNOSIS — E782 Mixed hyperlipidemia: Secondary | ICD-10-CM

## 2022-04-03 DIAGNOSIS — Z79899 Other long term (current) drug therapy: Secondary | ICD-10-CM

## 2022-04-03 DIAGNOSIS — K219 Gastro-esophageal reflux disease without esophagitis: Secondary | ICD-10-CM | POA: Diagnosis not present

## 2022-04-03 MED ORDER — GABAPENTIN 300 MG PO CAPS: 300.0000 mg | ORAL_CAPSULE | Freq: Three times a day (TID) | ORAL | 0 refills | Status: DC

## 2022-04-03 NOTE — Progress Notes (Signed)
Assessment and Plan:    Essential hypertension Controlled - continue bisoprolol, enalapril, hydrochlorothiazide, clonidine PRN Discussed DASH (Dietary Approaches to Stop Hypertension) DASH diet is lower in sodium than a typical American diet. Cut back on foods that are high in saturated fat, cholesterol, and trans fats. Eat more whole-grain foods, fish, poultry, and nuts Remain active and exercise as tolerated daily.  Monitor BP at home-Call if greater than 130/80.  Check CMP/CBC  - CBC with Differential/Platelet - COMPLETE METABOLIC PANEL WITH GFR  Alcohol use disorder Continue Gabapentin, Naltrexone Continue IOP program Continue AA meetings   - Vitamin B12  Gastroesophageal reflux disease, unspecified whether esophagitis present Continue Omeprazole Lifestyle modification:  wt loss, avoid meals 2-3h before bedtime. Consider eliminating food triggers:  chocolate, caffeine, EtOH, acid/spicy food.  Abnormal glucose Education: Reviewed 'ABCs' of diabetes management  Discussed goals to be met and/or maintained include A1C (<7) Blood pressure (<130/80) Cholesterol (LDL <70) Continue Eye Exam yearly  Continue Dental Exam Q6 mo Discussed dietary recommendations Discussed Physical Activity recommendations Check A1C  - Hemoglobin A1c  Hyperlipidemia, mixed Continue Rosuvastatin Discussed lifestyle modifications. Recommended diet heavy in fruits and veggies, omega 3's. Decrease consumption of animal meats, cheeses, and dairy products. Remain active and exercise as tolerated. Continue to monitor. Check lipids/TSH  - Lipid panel  Vitamin D deficiency Continue supplement for a goal of 60-100 Monitor levels  - VITAMIN D 25 Hydroxy (Vit-D Deficiency, Fractures)  Overweight (BMI 25.0-29.9) Discussed appropriate BMI Diet modification. Physical activity. Encouraged/praised to build confidence.  Depression with anxiety Continue buspirone, gabapentin, sertraline,  hydroxyzine, propranolol. Continue AA meetings, IOP Reviewed relaxation techniques.  Sleep hygiene. Recommended mindfulness meditation and exercise.   Encouraged personality growth wand development through coping techniques and problem-solving skills. Limit/Decrease/Monitor drug/alcohol intake.    Medication management All medications discussed and reviewed in full. All questions and concerns regarding medications addressed.    - CBC with Differential/Platelet - COMPLETE METABOLIC PANEL WITH GFR - Lipid panel - VITAMIN D 25 Hydroxy (Vit-D Deficiency, Fractures) - Vitamin B12 - Hemoglobin A1c    Notify office for further evaluation and treatment, questions or concerns if any reported s/s fail to improve.   The patient was advised to call back or seek an in-person evaluation if any symptoms worsen or if the condition fails to improve as anticipated.   Further disposition pending results of labs. Discussed med's effects and SE's.    I discussed the assessment and treatment plan with the patient. The patient was provided an opportunity to ask questions and all were answered. The patient agreed with the plan and demonstrated an understanding of the instructions.  Discussed med's effects and SE's. Screening labs and tests as requested with regular follow-up as recommended.  I provided 20 minutes of face-to-face time during this encounter including counseling, chart review, and critical decision making was preformed.  Future Appointments  Date Time Provider [place]  04/05/2022  9:00 AM [facility] None  04/07/2022  9:00 AM [facility] None  04/10/2022  9:00 AM [facility] None  04/12/2022  9:00 AM [facility] None  04/14/2022  9:00 AM [facility] None  04/17/2022  9:00 AM [facility] None  04/19/2022  9:00 AM [facility] None  04/21/2022  9:00 AM [facility] None  04/24/2022  9:00 AM [facility] None  04/26/2022  9:00 AM  [facility] None  04/28/2022  9:00 AM [facility] None  05/01/2022  9:00 AM [facility] None  05/03/2022  9:00 AM  [facility] None  05/05/2022  9:00 AM [facility] None  07/03/2022  4:00 PM Unk Pinto, MD [facility] None  01/01/2023  3:00 PM Unk Pinto, MD [facility] None      HPI 47 y.o. male  presents for 3 month follow up on hypertension, cholesterol, glucose management, hypogonadism, overweight and vitamin D deficiency.   He has recently returned home from two rehab programs for alcoholism.  First went to [place] for 60 days, relapsed and recently returned from [place] on 03/05/22 for 30 days. He is now invested in the [facility] program and continuing to attend meetings.  He continues to remain alcohol free. Taking Naltrexone.   He is working night shift at [employer], [place] but has 25 years vested, trying to hold out until retirement.   He is taking sertraline 50 mg daily for mood. He is no longer taking Xanax.  He was weaned during his 61 day rehab period in [place].   Continues to take buspirone, gabapentin, hydroxyzine, propranolol.  He is on CPAP for OSA.   GERD on omeprazole taking regularly, denies reflux, belching, coughing.    BMI is Body mass index is 31.33 kg/m., he has not been working on diet and exercise Wt Readings from Last 3 Encounters:  04/03/22 257 lb 6.4 oz (116.8 kg)  01/16/22 257 lb 6.4 oz (116.8 kg)  12/26/21 256 lb 6.4 oz (116.3 kg)    He has BP cuff but hasn't been checking at home, today their BP is BP: 112/76, similar on recheck. Taking bisoprolol 10 mg, enalapril 40 mg daily, clonidine.   He does not workout He denies chest pain, shortness of breath, dizziness.   He is on cholesterol medication, WAS STOPPED DUE TO LIVER ENZYMES, has restarted and prescribed rosuvastatin 20 mg three days a week but reports GI upset after taking, only once a week or so. His cholesterol is not at goal. He admits may not drink during the week  but then 12 pack on the weekend.  The cholesterol last visit was:   Lab Results  Component Value Date   CHOL 187 12/26/2021   HDL 46 12/26/2021   LDLCALC 119 (H) 12/26/2021   TRIG 113 12/26/2021   CHOLHDL 4.1 12/26/2021   Last A1C in the office was:  Lab Results  Component Value Date   HGBA1C 5.3 12/26/2021   Patient is on Vitamin D supplement.   Lab Results  Component Value Date   VD25OH 67 12/26/2021   He has a history of testosterone deficiency and is on testosterone replacement, taking 400 mg every 2 weeks, due today.  He states that the testosterone helps with his energy, libido, muscle mass. Lab Results  Component Value Date   TESTOSTERONE 1,619 (H) 12/26/2021     Current Medications:  Current Outpatient Medications on File Prior to Visit  Medication Sig   acamprosate (CAMPRAL) 333 MG tablet Take  2 tablets (666 mg)    3 x/day    with Meals     for Alcohol Use Disorder   bisoprolol (ZEBETA) 10 MG tablet TAKE 1 TABLET DAILY FOR BLOOD PRESSURE   busPIRone (BUSPAR) 10 MG tablet Take 1 tablet 2 to 3 x /day for Anxiety   cholecalciferol (VITAMIN D) 1000 UNITS tablet Take 1,000 Units by mouth daily. Take 5000 IU daily.   enalapril (VASOTEC) 20 MG tablet TAKE 2 TABLETS DAILY FOR BLOOD PRESSURE   hydrochlorothiazide (HYDRODIURIL) 25 MG tablet TAKE 1 TABLET EVERY MORNINGFOR BLOOD PRESSURE AND  FLUID RETENTION   hydrOXYzine (ATARAX) 25 MG tablet Take  1 tablet  2 to 3 x /day  as needed for Anxiety   Magnesium 400 MG CAPS Take 1 capsule by mouth daily.   naltrexone (DEPADE) 50 MG tablet Take  1 tablet  Daily  to Suppress Alcohol Cravings   Omega-3 Fatty Acids (FISH OIL) 1000 MG CAPS Take 2,000 mg by mouth daily.   omeprazole (PRILOSEC) 40 MG capsule TAKE 1 CAPSULE DAILY TO    PREVENT INDIGESTION AND    HEARTBURN   propranolol (INDERAL) 10 MG tablet Take  1 tablet  3 to 4 x /day  as needed for Acute Anxiety or Panic Attack   rosuvastatin (CRESTOR) 20 MG tablet TAKE 1 TABLET 3  DAYS PER   WEEK FOR CHOLESTEROL   sertraline (ZOLOFT) 100 MG tablet TAKE 1/2 TO 1 TABLET DAILY FOR MOOD AND ANXIETY (Patient taking differently: 150 mg. TAKE 1/2 TO 1 TABLET DAILY FOR MOOD AND ANXIETY)   testosterone cypionate (DEPOTESTOSTERONE CYPIONATE) 200 MG/ML injection Inject 1 ml into Muscle every 7 days   traZODone (DESYREL) 150 MG tablet Take  1/2 to 1 tablet  1 to 2 hours  before Bedtime  as needed for Sleep   No current facility-administered medications on file prior to visit.    Medical History:  Past Medical History:  Diagnosis Date   Anxiety    Depression    GERD (gastroesophageal reflux disease)    Hyperlipidemia    Hypertension    Hypogonadism male    Obesity    OSA on CPAP 2019   Vitamin D deficiency    Allergies:  Allergies  Allergen Reactions   Citalopram Other (See Comments)    anxiety   Pravastatin    Wellbutrin [Bupropion]      Review of Systems:  Review of Systems  Constitutional: Negative.  Negative for malaise/fatigue and weight loss.  HENT: Negative.  Negative for hearing loss and tinnitus.   Eyes: Negative.  Negative for blurred vision and double vision.  Respiratory: Negative.  Negative for cough, shortness of breath and wheezing.   Cardiovascular: Negative.  Negative for chest pain, palpitations, orthopnea, claudication and leg swelling.  Gastrointestinal:  Positive for abdominal pain and diarrhea. Negative for blood in stool, constipation, heartburn, melena, nausea and vomiting.  Genitourinary: Negative.   Musculoskeletal: Negative.  Negative for joint pain and myalgias.  Skin: Negative.  Negative for rash.  Neurological: Negative.  Negative for dizziness, tingling, sensory change, weakness and headaches.  Endo/Heme/Allergies: Negative.  Negative for polydipsia.  Psychiatric/Behavioral: Negative.    All other systems reviewed and are negative.   Family history- Review and unchanged Social history- Review and unchanged Physical Exam: BP  112/76   Pulse 67   Temp 97.7 F (36.5 C)   Ht 6' 4"$  (1.93 m)   Wt 257 lb 6.4 oz (116.8 kg)   SpO2 98%   BMI 31.33 kg/m  Wt Readings from Last 3 Encounters:  04/03/22 257 lb 6.4 oz (116.8 kg)  01/16/22 257 lb 6.4 oz (116.8 kg)  12/26/21 256 lb 6.4 oz (116.3 kg)   General Appearance: Well nourished, in no apparent distress. Eyes: PERRLA, EOMs, conjunctiva no swelling or erythema Sinuses: No Frontal/maxillary tenderness ENT/Mouth: Ext aud canals clear, TMs without erythema, bulging. No erythema, swelling, or exudate on post pharynx.  Tonsils not swollen or erythematous. Hearing normal.  Neck: Supple, thyroid normal.  Respiratory: Respiratory effort normal, BS equal bilaterally without rales, rhonchi, wheezing or stridor.  Cardio: RRR with no MRGs. Brisk peripheral pulses without edema.  Abdomen: Soft, + BS,  Non tender, no guarding, rebound, hernias, masses. Lymphatics: Non tender without lymphadenopathy.  Musculoskeletal: Full ROM, 5/5 strength, Normal gait Skin: Warm, dry without rashes, lesions, ecchymosis.  Neuro: Cranial nerves intact. Normal muscle tone, no cerebellar symptoms. Psych: Awake and oriented X 3, normal affect, Insight and Judgment appropriate.    Darrol Jump, NP 4:44 PM [facility]

## 2022-04-03 NOTE — Patient Instructions (Signed)
Alcohol Misuse and Dependence Information, Adult Alcohol is a widely available drug and people choose to drink alcohol in different amounts. Alcohol misuse and dependence can have a negative effect on your life. Alcohol misuse is when you use alcohol too much or too often. You may have a hard time setting a limit on the amount you drink. Alcohol dependence is when you use alcohol consistently for a period of time, and your body changes as a result. Alcohol dependence can make it hard for you to stop drinking because you may start to feel sick or different when you do not drink alcohol. These symptoms are known as withdrawal. People who drink alcohol very often and in large amounts, may develop what is called an alcohol use disorder. How can alcohol misuse and dependence affect me? Drinking too much can lead to addiction. You may feel like you need alcohol to function normally. You may drink alcohol before work in the morning, during the day, or as soon as you get home from work in the evening. These actions can result in: Poor work performance. Job loss. Financial problems. Car crashes or criminal charges from driving after drinking alcohol. Problems in your relationships with friends and family. Losing the trust and respect of coworkers, friends, and family. Drinking heavily over a long period of time can permanently damage your body and brain, and can cause lifelong health issues, such as: Damage to your liver or pancreas. Heart problems, high blood pressure, or stroke. Certain cancers. Decreased ability to fight infections. Brain or nerve damage. Depression. Early death, also called premature death. If you are careless or you crave alcohol, it is easy to drink more than your body can handle (overdose). Alcohol overdose is a serious situation that requires hospitalization. It may lead to permanent injuries or death. What can increase my risk? Having a family history of alcohol misuse. Having  depression or other mental health conditions. Beginning to drink at an early age. Binge drinking often. Experiencing trauma, stress, and an unstable home life during childhood. Spending time with people who drink often. What actions can I take to prevent alcohol misuse and dependence? Do not drink alcohol if: Your health care provider tells you not to drink. You are pregnant, may be pregnant, or are planning to become pregnant. If you drink alcohol: Limit how much you have to: 0-1 drink a day for women who are not pregnant. 0-2 drinks a day for men. Know how much alcohol is in your drink. In the [place] one drink equals one 12 oz bottle of beer (355 mL), one 5 oz glass of wine (148 mL), or one 1 oz glass of hard liquor (44 mL). If you think you have an alcohol dependency problem, decide to stop drinking. This can be very hard to do if you are used to frequently drinking alcohol. If you begin to have withdrawal symptoms, talk with your health care provider or a person that you trust. These symptoms may include anxiety, shaky hands, headache, nausea, sweating, or not being able to sleep. Choose to drink nonalcoholic beverages in social gatherings and places where there may be alcohol. Activity Spend more time on activities that you enjoy that do not involve alcohol, like hobbies or exercise. Find healthy ways to cope with stress, such as meditation or spending time with people you care about. General information Talk to your family, coworkers, and friends about supporting you in your efforts to stop drinking. If they drink, ask them not to drink  around you. Spend more time with people who do not drink alcohol. If you think that you have an alcohol dependency problem: Tell friends or family about your concerns. Talk with your health care provider or another health professional about where to get help. Work with a Transport planner and a Regulatory affairs officer. Consider joining a support group  for people who struggle with alcohol misuse and dependence. Where to find support  Your health care provider. SMART Recovery: smartrecovery.org Local treatment centers or chemical dependency counselors. Local AA groups in your community: ShedSizes.ch Where to find more information Centers for Disease Control and Prevention: StoreMirror.com.cy [employer] on Alcohol Abuse and Alcoholism: LinkVoyage.dk Alcoholics Anonymous (AA): ShedSizes.ch Contact a health care provider if: You drank more or for longer than you intended on more than one occasion. You often drink to the point of vomiting or passing out. You have problems in your life due to drinking, but you continue to drink. You keep drinking even though you feel anxious, depressed, or have experienced memory loss. You have stopped doing the things you used to enjoy in order to drink. You have to drink more than you used to in order to get the effect you want. You experience anxiety, sweating, nausea, shakiness, and trouble sleeping when you try to stop drinking. Get help right away if: You have serious withdrawal symptoms, including: Confusion. Racing heart. High blood pressure. Fever. These symptoms may be an emergency. Get help right away. Call 911. Do not wait to see if the symptoms will go away. Do not drive yourself to the hospital. Also, get help right away if: You have thoughts about hurting yourself or others. Take one of these steps if you feel like you may hurt yourself or others, or have thoughts about taking your own life: Call 911. Call the [place] at 650-380-9757 or 988. This is open 24 hours a day. Text the Crisis Text Line at 435-453-6451. Summary Alcohol misuse and dependence can have a negative effect on your life. Drinking too much or too often can lead to addiction. If you drink alcohol, limit how much you use. If you are having trouble keeping your drinking under control, find ways to change your  behavior. Hobbies, calming activities, exercise, or support groups can help. If you feel you need help with changing your drinking habits, talk with your health care provider, a good friend, or a therapist, or go to a support group. This information is not intended to replace advice given to you by your health care provider. Make sure you discuss any questions you have with your health care provider. Document Revised: 04/13/2021 Document Reviewed: 04/13/2021 Elsevier Patient Education  [place].

## 2022-04-04 LAB — CBC WITH DIFFERENTIAL/PLATELET
Absolute Monocytes: 425 cells/uL (ref 200–950)
Basophils Absolute: 30 cells/uL (ref 0–200)
Basophils Relative: 0.5 %
Eosinophils Absolute: 59 cells/uL (ref 15–500)
Eosinophils Relative: 1 %
HCT: 40.5 % (ref 38.5–50.0)
Hemoglobin: 14.3 g/dL (ref 13.2–17.1)
Lymphs Abs: 1835 cells/uL (ref 850–3900)
MCH: 32.1 pg (ref 27.0–33.0)
MCHC: 35.3 g/dL (ref 32.0–36.0)
MCV: 90.8 fL (ref 80.0–100.0)
MPV: 11.6 fL (ref 7.5–12.5)
Monocytes Relative: 7.2 %
Neutro Abs: 3552 cells/uL (ref 1500–7800)
Neutrophils Relative %: 60.2 %
Platelets: 238 10*3/uL (ref 140–400)
RBC: 4.46 10*6/uL (ref 4.20–5.80)
RDW: 12.2 % (ref 11.0–15.0)
Total Lymphocyte: 31.1 %
WBC: 5.9 10*3/uL (ref 3.8–10.8)

## 2022-04-04 LAB — HEMOGLOBIN A1C
Hgb A1c MFr Bld: 5.7 % of total Hgb — ABNORMAL HIGH (ref <5.7)
Mean Plasma Glucose: 117 mg/dL
eAG (mmol/L): 6.5 mmol/L

## 2022-04-04 LAB — LIPID PANEL
Cholesterol: 291 mg/dL — ABNORMAL HIGH (ref <200)
HDL: 36 mg/dL — ABNORMAL LOW (ref >=40)
LDL Cholesterol (Calc): 188 mg/dL (calc) — ABNORMAL HIGH
Non-HDL Cholesterol (Calc): 255 mg/dL (calc) — ABNORMAL HIGH (ref <130)
Total CHOL/HDL Ratio: 8.1 (calc) — ABNORMAL HIGH (ref <5.0)
Triglycerides: 393 mg/dL — ABNORMAL HIGH (ref <150)

## 2022-04-04 LAB — COMPLETE METABOLIC PANEL WITH GFR
AG Ratio: 1.9 (calc) (ref 1.0–2.5)
ALT: 21 U/L (ref 9–46)
AST: 18 U/L (ref 10–40)
Albumin: 4.6 g/dL (ref 3.6–5.1)
Alkaline phosphatase (APISO): 56 U/L (ref 36–130)
BUN: 13 mg/dL (ref 7–25)
CO2: 27 mmol/L (ref 20–32)
Calcium: 9.5 mg/dL (ref 8.6–10.3)
Chloride: 105 mmol/L (ref 98–110)
Creat: 0.99 mg/dL (ref 0.60–1.29)
Globulin: 2.4 g/dL (calc) (ref 1.9–3.7)
Glucose, Bld: 89 mg/dL (ref 65–99)
Potassium: 4.2 mmol/L (ref 3.5–5.3)
Sodium: 140 mmol/L (ref 135–146)
Total Bilirubin: 0.5 mg/dL (ref 0.2–1.2)
Total Protein: 7 g/dL (ref 6.1–8.1)
eGFR: 95 mL/min/{1.73_m2} (ref >=60)

## 2022-04-04 LAB — VITAMIN B12: Vitamin B-12: 767 pg/mL (ref 200–1100)

## 2022-04-04 LAB — VITAMIN D 25 HYDROXY (VIT D DEFICIENCY, FRACTURES): Vit D, 25-Hydroxy: 59 ng/mL (ref 30–100)

## 2022-04-04 NOTE — Progress Notes (Signed)
Daily Group Progress Note   Program: [facility]     Individual Time: 9 a.m. to 12 p.m.   Type of Therapy: Process and Psychoeducational    Topic: The therapist checks in with group members, assesses for SI/HI/psychosis and overall level of functioning. The therapist inquires about sobriety date and number of community support meetings attended since last session.    The therapist has group members complete the exercise on sharing what their using rituals looked like. The therapist talks about lying in relation to addiction noting that addiction requires dishonesty.   The therapist answers questions about HIPAA and talks about the importance of family being involved in treatment when possible so as to understand the disease of addiction and to be supportive of one's need to attend meetings.    Summary: [name] presents rating his depression as a "2" and her anxiety as a "4."    [name] says that he used to tell himself that he was not an alcoholic as he could go for days without drinking and would also tell himself that he would not drink if he were not "on these meds."  He shares his using ritual which the therapist observes sounds labor-intensive in relation to his efforts to hide his drinking from his wife and his family. He notes that he would already be drunk and get with friends and say, "Let's get started" to act as though he had not started drinking yet. He says that it got to the point that he was no longer drinking for enjoyment. He says that he began to realize that things were getting out-of-control when he missed a birthday event for his wife a couple of years ago due to being sick from drinking. He notes that his wife was an Estate manager/land agent" for a while and would buy beer for him; however, she stopped doing this.  [name] describes his mood as "blah" and "indifferent" and says that he feels like he is just going through the motions questioning, "what's the purpose?" He admits that he tends to feel more  comfortable isolating and being alone. The therapist informs. [name] that he needs to start forcing himself to go to meetings pointing out how another group member does this saying that he always ends up feeling better after doing so. This other group member makes [name] aware of a meeting they can attend together after [name] complains about there not being any meetings around 2 or 3 p.m.    Progress Towards Goals: [name] reports no alcohol use.    UDS collected: Yes Results: No   AA/NA attended?: No   Sponsor?: No   Adam Phenix, [place], LCSW, [facility], LCAS 04/03/2022

## 2022-04-05 ENCOUNTER — Ambulatory Visit (INDEPENDENT_AMBULATORY_CARE_PROVIDER_SITE_OTHER): Payer: 59 | Admitting: Licensed Clinical Social Worker

## 2022-04-05 ENCOUNTER — Encounter (HOSPITAL_COMMUNITY): Payer: Self-pay | Admitting: Licensed Clinical Social Worker

## 2022-04-05 ENCOUNTER — Encounter (HOSPITAL_COMMUNITY): Payer: Self-pay

## 2022-04-05 DIAGNOSIS — F99 Mental disorder, not otherwise specified: Secondary | ICD-10-CM

## 2022-04-05 DIAGNOSIS — Z6372 Alcoholism and drug addiction in family: Secondary | ICD-10-CM

## 2022-04-05 DIAGNOSIS — F341 Dysthymic disorder: Secondary | ICD-10-CM

## 2022-04-05 DIAGNOSIS — F1020 Alcohol dependence, uncomplicated: Secondary | ICD-10-CM | POA: Diagnosis not present

## 2022-04-05 DIAGNOSIS — F5105 Insomnia due to other mental disorder: Secondary | ICD-10-CM

## 2022-04-05 DIAGNOSIS — T7432XS Child psychological abuse, confirmed, sequela: Secondary | ICD-10-CM

## 2022-04-05 DIAGNOSIS — F1321 Sedative, hypnotic or anxiolytic dependence, in remission: Secondary | ICD-10-CM

## 2022-04-05 DIAGNOSIS — F17200 Nicotine dependence, unspecified, uncomplicated: Secondary | ICD-10-CM

## 2022-04-05 DIAGNOSIS — F989 Unspecified behavioral and emotional disorders with onset usually occurring in childhood and adolescence: Secondary | ICD-10-CM

## 2022-04-05 NOTE — Progress Notes (Signed)
Daily Group Progress Note   Program: [facility]     Individual Time: 9 a.m. to 12 p.m.   Type of Therapy: Process and Psychoeducational    Topic: The therapist checks in with group members, assesses for SI/HI/psychosis and overall level of functioning. The therapist inquires about sobriety date and number of community support meetings attended since last session.    The therapist facilitates discussion on a number of topics including the phrase, "easy does it,' the challenges involved in examining one's character defects, the fact that addiction has a genetic component, what is meant by having a "reservation" in relation to one's recovery, the sugar cravings that many people experience in early recovery and how exercise can decrease the intensity and duration of PAWS. The therapist also talks about the fact that life is not neat and ordered by often times messy and that living life on life's terms can be challenging at time.    Summary: Ian Zavala presents rating his depression as a "2" and her anxiety as a "2."    He says that he feels "rushed" and "anxious" believing that he procrastinated in getting his wife her Valentine's Day gifts as he says that he needs to get her flowers and a card; however, group members note that he has not procrastinated as he already bought her surprise concert tickets prior to today.   He says that he attended an in-person meeting in [place] noting that it was suggested that one cannot take another person's inventory which he relates to today's group discussion. At the same time, he points out that there was a guy there who seemed to tell a war story. The therapist suggests that Ian Zavala take what he needs and leave the rest and observes that Ian Zavala tends to look for negatives in meetings with the therapist validating that there will always be things in meetings he does not like. The therapist encourages Ian Zavala to focus on what positives occurred at the meetings.   Ian Zavala says that  as a result of quitting drinking that his liver enzymes now look good; however, he says that he is eating too many sweets so is at risk of diabetes. He then suggests that he cannot have anything with the therapist noting that Ian Zavala can have sweets but simply needs to have them in moderate. Ian Zavala does say that he has done some exercise with the therapist informing Ian Zavala that exercising will help to reduce his depression and issues with PAWS as well.   At the conclusion of group, another group member asks Ian Zavala if he would like to go to a meeting that is a few minutes away. Ian Zavala thinks for a bit and finally concludes that he has time before having to get his wife's flowers and then observes that his disease was trying to come up with excuses for not attending. He leaves with this other member to go to a meeting.    Progress Towards Goals: Ian Zavala reports no alcohol use.    UDS collected: No Results: No   AA/NA attended?: Yes   Sponsor?: No   Adam Phenix, [place], LCSW, [facility], LCAS 04/05/2022
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
## 2022-04-05 NOTE — Progress Notes (Signed)
Patient ID: Ian Zavala, male   DOB: 01-04-1976, 47 y.o.   MRN: [id number]
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
## 2022-04-07 ENCOUNTER — Ambulatory Visit (INDEPENDENT_AMBULATORY_CARE_PROVIDER_SITE_OTHER): Payer: 59 | Admitting: Licensed Clinical Social Worker

## 2022-04-07 DIAGNOSIS — F17200 Nicotine dependence, unspecified, uncomplicated: Secondary | ICD-10-CM

## 2022-04-07 DIAGNOSIS — F1020 Alcohol dependence, uncomplicated: Secondary | ICD-10-CM

## 2022-04-07 DIAGNOSIS — F1321 Sedative, hypnotic or anxiolytic dependence, in remission: Secondary | ICD-10-CM

## 2022-04-07 NOTE — Progress Notes (Signed)
Daily Group Progress Note   Program: CD IOP     Individual Time: 9 a.m. to 12 p.m.   Type of Therapy: Process and Psychoeducational    Topic: The therapist checks in with group members, assesses for SI/HI/psychosis and overall level of functioning. The therapist inquires about sobriety date and number of community support meetings attended since last session.    The therapist has group members finish watching the video, "Breaking the Addiction Cycle" challenging them to come up with ways to interrupt their own using rituals when in a trance-like state. The therapist also emphasizes the two biggest keys to recovery illustrated in the video while answering questions about process addictions  The therapist explains what is meant by the program being one of "attraction" but not "promotion." He shares the NA Just for Today reading for the day which focuses on the fact that bad things will still happen to people when they are in recovery but that they will find that they can learn to sit with uncomfortable emotions.    Summary: Ian Zavala presents rating his depression as a "3" and his anxiety as a "2."    He attended an in person meeting with one of the other clients in IOP after last group with both agreeing that it was a very good meeting in that a lot of people were picking up chips and there was a lot of participation.   Ian Zavala says that he has still not gotten paid although his EAP person submitted the forms to the proper agencies or departments. Thus, he concludes that he may have to make time to call the company that is supposed to be paying him to find out what the delay is saying that in doing so that he could be on hold for a couple of hours.   He says that he is "tired" as he is not sleeping well and "irritable" wondering if this could be due to PAWS with the therapist noting that poor sleep, irritability, mood swings, and an inability to concentrate can all be related to PAWS which does  eventually get better. Ian Zavala says that he plans on doing a meeting tonight.    Progress Towards Goals: Ian Zavala reports no alcohol use.    UDS collected: No Results: No   AA/NA attended?: Yes   Sponsor?: No   Adam Phenix, [place], LCSW, [facility], LCAS 04/07/2022

## 2022-04-10 ENCOUNTER — Ambulatory Visit (INDEPENDENT_AMBULATORY_CARE_PROVIDER_SITE_OTHER): Payer: 59 | Admitting: Licensed Clinical Social Worker

## 2022-04-10 DIAGNOSIS — F1321 Sedative, hypnotic or anxiolytic dependence, in remission: Secondary | ICD-10-CM

## 2022-04-10 DIAGNOSIS — F17200 Nicotine dependence, unspecified, uncomplicated: Secondary | ICD-10-CM

## 2022-04-10 DIAGNOSIS — F1020 Alcohol dependence, uncomplicated: Secondary | ICD-10-CM | POA: Diagnosis not present

## 2022-04-10 NOTE — Progress Notes (Signed)
Daily Group Progress Note   Program: CD IOP     Individual Time: 9 a.m. to 12 p.m.   Type of Therapy: Process and Psychoeducational    Topic: The therapist checks in with group members, assesses for SI/HI/psychosis and overall level of functioning. The therapist inquires about sobriety date and number of community support meetings attended since last session.    The therapist addresses issues concerning how to deal with social anxiety in addition to facilitating a group discussion on what essentially is radical acceptance noting the saying, "suffering is inevitable but misery is optional."  The therapist has group members complete Matrix Model Recovery Checklist IC 2A.    Summary: Ian Zavala presents rating his depression as a "2" and his anxiety as a "2."    He tells a new member who has yet to be to a meeting that he will attend the meeting that she is planning on attending to see her pick-up her first chip. He went to a meeting close to home and got back in touch with the guy who offered to be his Sponsor in November of last year and is going to call him so has a Publishing copy.   He says that today is his 36th wedding anniversary and that he feels "grateful" and "hopeful." He says that he finds going to meetings helpful as the people at meetings understand what he is dealing with while people at home tend to expect everything to be okay now that he is not drinking.   Ian Zavala shares his answers to the Recovery Checklist and says that his biggest takeaway from today's group is to trust the process.    Progress Towards Goals: Ian Zavala reports no alcohol use.    UDS collected: Yes Results: Yes, negative for drugs and alcohol   AA/NA attended?: Yes   Sponsor?: Yes   Adam Phenix, [place], [place], [facility], [place] 04/10/2022

## 2022-04-12 ENCOUNTER — Telehealth (HOSPITAL_COMMUNITY): Payer: Self-pay | Admitting: Licensed Clinical Social Worker

## 2022-04-12 ENCOUNTER — Ambulatory Visit (INDEPENDENT_AMBULATORY_CARE_PROVIDER_SITE_OTHER): Payer: 59 | Admitting: Licensed Clinical Social Worker

## 2022-04-12 DIAGNOSIS — F17200 Nicotine dependence, unspecified, uncomplicated: Secondary | ICD-10-CM

## 2022-04-12 DIAGNOSIS — F1321 Sedative, hypnotic or anxiolytic dependence, in remission: Secondary | ICD-10-CM

## 2022-04-12 DIAGNOSIS — F1020 Alcohol dependence, uncomplicated: Secondary | ICD-10-CM | POA: Diagnosis not present

## 2022-04-13 NOTE — Progress Notes (Signed)
Daily Group Progress Note   Program: CD IOP     Individual Time: 9 a.m. to 12 p.m.   Type of Therapy: Process and Psychoeducational    Topic: The therapist checks in with group members, assesses for SI/HI/psychosis and overall level of functioning. The therapist inquires about sobriety date and number of community support meetings attended since last session.    The therapist educates group members about a new, potentially dangerous legal drug, Tianeptine encouraging them to avoid it if they encounter it. The therapist primarily focuses on what is meant by being an "ACOA" and on how to deal with dysfunctional family dynamics via limit setting while noting that people can grieve for the family that they did not have and may possibly never have.    Summary: Ian Zavala presents rating his depression as a "2" and his anxiety as a "2."    He has not been to a meeting but did call his new Sponsor and they are going to get together this evening. Ian Zavala believes that this new Sponsor is going to be good for him noting that he has a tendency to "procrastinate" and needs someone to "kick" him "in the butt." He says that he called the company that is supposed to be paying him while on leave and was on hold for two hours but believes the situation is now fixed. He says that his mood is "optimistic" about the new Sponsor but also "anxious."  Ian Zavala says that he talked to his mother for the first time since December saying that it was a relief to get this over with. He says that in talking with his mother that he never knows "what version" he "will get.' He says that she told him growing up that he would "never amount to nothing" so he learned, "don't try for anymore." He says that his mom asked him if he needed money; however, he adds that all she does is complain about not having money. After their parents split, he says that all she did for years was "talk shit" about him only to play the bereaved ex-wife after he died.  Ian Zavala recalls that he never saw his parents kiss or hug and that if she found anything slightly out of place that she would trash his room and dump the things in his drawers on the floor. He left home in his teens as he said that he finally had enough.  At the same time, Ian Zavala says that he feels guilty like he "should visit"  Her "more often" with the therapist and other group members validating that Ian Zavala has a lot of good reasons to not visit her so as to protect himself emotionally. Another group member who compares her father to [name] says that she would have to drink in order to get up the nerve to see her father.    Progress Towards Goals: Ian Zavala reports no alcohol use.    UDS collected: No Results: No   AA/NA attended?: No   Sponsor?: Yes   Ian Zavala, [place], [place], [facility], [place] 04/12/2022

## 2022-04-14 ENCOUNTER — Ambulatory Visit (INDEPENDENT_AMBULATORY_CARE_PROVIDER_SITE_OTHER): Payer: 59 | Admitting: Licensed Clinical Social Worker

## 2022-04-14 DIAGNOSIS — F1020 Alcohol dependence, uncomplicated: Secondary | ICD-10-CM

## 2022-04-14 DIAGNOSIS — F1321 Sedative, hypnotic or anxiolytic dependence, in remission: Secondary | ICD-10-CM

## 2022-04-14 DIAGNOSIS — F17200 Nicotine dependence, unspecified, uncomplicated: Secondary | ICD-10-CM

## 2022-04-14 NOTE — Progress Notes (Signed)
Daily Group Progress Note   Program: CD IOP     Individual Time: 9 a.m. to 12 p.m.   Type of Therapy: Process and Psychoeducational    Topic: The therapist checks in with group members, assesses for SI/HI/psychosis and overall level of functioning. The therapist inquires about sobriety date and number of community support meetings attended since last session.   The therapist facilitates a discussion on how to maintain healthy boundaries in interpersonal relationships and what is meant by getting "triangulated" while explaining how this connects to maintaining one's sobriety. The therapist shares the NA Just for Today Reading concerning putting principles before personality and the need to not "shoot the messenger." The therapist also focuses on the benefits of accountability specifically when the person to whom the person is being accountable is someone he or she knows cares.   Summary: Ian Zavala presents rating his depression as a "2" and his anxiety as a "2."    He has attended three in person meetings and met with his Sponsor Wednesday evening. He recalls his negative view at being told by this therapist that he would be encouraged to get a Sponsor before starting group but not is "content" and "excited" at the structure he will receive in working with his Sponsor. He says that he previously would say that he would be "hitting a meeting" but it meant that he would not be attending a meeting. Now, he has the accountability of his Sponsor checking in with him daily and knows his Sponsor cares.  He says that his Sponsor gave him five things to do which are: 1) pray and meditate daily, 2) read his Big Book daily, 3) call his Sponsor daily, 4) go to meetings daily, and 5) thank God daily.    Progress Towards Goals: Ian Zavala reports no alcohol use.    UDS collected: No Results: No   AA/NA attended?: Yes   Sponsor?: Yes   Ian Zavala, [place], [place], [facility], [facility] 04/14/2022

## 2022-04-17 ENCOUNTER — Ambulatory Visit (INDEPENDENT_AMBULATORY_CARE_PROVIDER_SITE_OTHER): Payer: 59 | Admitting: Licensed Clinical Social Worker

## 2022-04-17 DIAGNOSIS — F989 Unspecified behavioral and emotional disorders with onset usually occurring in childhood and adolescence: Secondary | ICD-10-CM

## 2022-04-17 DIAGNOSIS — F341 Dysthymic disorder: Secondary | ICD-10-CM

## 2022-04-17 DIAGNOSIS — E291 Testicular hypofunction: Secondary | ICD-10-CM

## 2022-04-17 DIAGNOSIS — F17200 Nicotine dependence, unspecified, uncomplicated: Secondary | ICD-10-CM

## 2022-04-17 DIAGNOSIS — F1020 Alcohol dependence, uncomplicated: Secondary | ICD-10-CM

## 2022-04-17 DIAGNOSIS — T7432XS Child psychological abuse, confirmed, sequela: Secondary | ICD-10-CM

## 2022-04-17 DIAGNOSIS — F5105 Insomnia due to other mental disorder: Secondary | ICD-10-CM

## 2022-04-17 DIAGNOSIS — F17210 Nicotine dependence, cigarettes, uncomplicated: Secondary | ICD-10-CM

## 2022-04-17 DIAGNOSIS — F99 Mental disorder, not otherwise specified: Secondary | ICD-10-CM

## 2022-04-17 DIAGNOSIS — Z6372 Alcoholism and drug addiction in family: Secondary | ICD-10-CM

## 2022-04-17 DIAGNOSIS — F32A Depression, unspecified: Secondary | ICD-10-CM

## 2022-04-17 DIAGNOSIS — F1321 Sedative, hypnotic or anxiolytic dependence, in remission: Secondary | ICD-10-CM

## 2022-04-17 DIAGNOSIS — F419 Anxiety disorder, unspecified: Secondary | ICD-10-CM

## 2022-04-17 NOTE — Progress Notes (Signed)
Daily Group Progress Note   Program: CD IOP     Individual Time: 9 a.m. to 12 p.m.   Type of Therapy: Process and Psychoeducational    Topic: The therapist checks in with group members, assesses for SI/HI/psychosis and overall level of functioning. The therapist inquires about sobriety date and number of community support meetings attended since last session.   The therapist discusses the consequences of getting a DWI which three of the four group members have experienced. The therapist notes that working Step Three is challenging for many people especially if people have trauma associated with organized religion and "God." For example, there are people who have been traumatized at [facility] or who believe that God was not to be found when they were suffering or people who have an adverse reaction to "God" having been forced to go to [place], [employer] and education [employer].  The therapist facilitates a discussion on when to get a Sponsor and how to navigate issues when a person feels confused by his or her Sponsor's direction.   Summary: Ian Zavala presents rating his depression as a "2" and his anxiety as a "2."    He attended three meetings one of which was in [place]. He was self-conscious about attending this meeting as this was the meeting he was attending before his last relapse. He says that they kept messaging him after he stopped going and he sent them angry messages when drunk but deleted these threads so is not exactly sure what he put in the messages. The therapist suggests that Ian Zavala could simply admit this to the group when sharing and offer an apology while thanking them for there support.   Ian Zavala says that his Sponsor has encouraged him to not share at meetings until he has worked Steps 4 and 5. He questions if he should be going to the same meetings as his Sponsor with the group suggesting that this is not necessary and that attending meetings is enough. Ian Zavala alludes to the possibility of firing one's Sponsor  when noting that his Sponsor has yet to smile and that there are long, awkward silences during calls. At the same time, he states his intent to stick things out with him for now.   Ian Zavala says that he feels accepted and thankful saying that AA is a good program. He talks about a man who was in a meeting with him who is apparently sleeping in a building as he is homeless with Ian Zavala that there are homeless people working the program which makes him realize the things that he takes for granted. He says that he was told to keep a gratitude journal when inpatient but quit after a few days but believes it may be good for him to start again.    Progress Towards Goals: Ian Zavala reports no alcohol use.    UDS collected: Yes Results: Yes, negative for drugs and alcohol   AA/NA attended?: Yes   Sponsor?: Yes   Ian Zavala, [place], [place], [facility], [place] 04/17/2022
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
## 2022-04-19 ENCOUNTER — Ambulatory Visit (INDEPENDENT_AMBULATORY_CARE_PROVIDER_SITE_OTHER): Payer: 59 | Admitting: Licensed Clinical Social Worker

## 2022-04-19 DIAGNOSIS — F17200 Nicotine dependence, unspecified, uncomplicated: Secondary | ICD-10-CM

## 2022-04-19 DIAGNOSIS — F1020 Alcohol dependence, uncomplicated: Secondary | ICD-10-CM | POA: Diagnosis not present

## 2022-04-19 DIAGNOSIS — F1321 Sedative, hypnotic or anxiolytic dependence, in remission: Secondary | ICD-10-CM

## 2022-04-19 NOTE — Progress Notes (Signed)
Daily Group Progress Note   Program: CD IOP     Individual Time: 9 a.m. to 12 p.m.   Type of Therapy: Process and Psychoeducational    Topic: The therapist checks in with group members, assesses for SI/HI/psychosis and overall level of functioning. The therapist inquires about sobriety date and number of community support meetings attended since last session.   The therapist focuses again on why learning to become assertive is critical in regard to developing healthy relationships which is turn is critical to one's recovery giving examples of this while modeling assertive responses. He discusses the reason that working the 4th Step is so challenging for people and also discusses the problems that arise when getting sober while one's partner or significant other does not. The therapist educates group members on how to use radical acceptance" and educates them on what an [facility] is and how it can be beneficial to many in recovery.     Summary: Ian Zavala presents rating his depression as a "3" and his anxiety as a "3."    Ian Zavala says that he attended two meetings and that he feels "tired" and "anxious." He has been contact from some co-workers at his job asking when he is going to return. He is feeling anxious about how he will be able to work, attend meetings, and go to [place] events that his wife wants him to attend. He did not want to go to a meeting the other day but went anyway.   He says that his mother contacted him asking him to do her taxes with Ian Zavala questioning if he is not doing what he should be doing in relation to his mother. The therapist points out that his mother can get her taxes done for free at the [employer] [facility]. Ian Zavala says that his wife expects him to go to [facility] events but this puts him in contact with guys with whom he would drink who still drink with his wife not wanting to give up this Theodoro Kos as she is friends with these guys' wives. The therapist observes  that Ian Zavala needs to be assertive in setting limits with co-workers, his mom, and his wife in an effort to put his recovery first and reminds Ian Zavala that this therapist can facilitate family sessions.    Progress Towards Goals: Ian Zavala reports no alcohol use.    UDS collected: No Results: No   AA/NA attended?: Yes   Sponsor?: Yes   Adam Phenix, [place], [place], [facility], [place] 04/19/2022

## 2022-04-21 ENCOUNTER — Ambulatory Visit (INDEPENDENT_AMBULATORY_CARE_PROVIDER_SITE_OTHER): Payer: 59 | Admitting: Licensed Clinical Social Worker

## 2022-04-21 DIAGNOSIS — F1321 Sedative, hypnotic or anxiolytic dependence, in remission: Secondary | ICD-10-CM

## 2022-04-21 DIAGNOSIS — F17200 Nicotine dependence, unspecified, uncomplicated: Secondary | ICD-10-CM

## 2022-04-21 DIAGNOSIS — F1020 Alcohol dependence, uncomplicated: Secondary | ICD-10-CM

## 2022-04-21 NOTE — Progress Notes (Signed)
Daily Group Progress Note   Program: CD IOP     Individual Time: 9 a.m. to 12 p.m.   Type of Therapy: Process and Psychoeducational    Topic: The therapist checks in with group members, assesses for SI/HI/psychosis and overall level of functioning. The therapist inquires about sobriety date and number of community support meetings attended since last session.   The therapist talks about how one goes about choosing a Sponsor and that one has to find the right fit so may have to change Sponsors which is okay. He educates group members on the long-term impact that alcohol has on a person's sleep pattern and how long it will take for this to return to normal. He emphasizes that no matter what organization a person joins that as long as it has people that none will be perfect and there will always be challenging people. He shares research about recovery taking place in Fellowship but notes that this Fellowship does not just have to be AA or NA but there are other options for developing a non-using support system.   The therapist introduces two new group members today while saying goodbye to two who are graduating the program.    Summary: [name] presents rating his depression as a "3" and his anxiety as a "4."    He attended two meetings but says that he is "frustrated" and "anxious" based on his Sponsor suggesting that [name] is not getting Step One because [name] did not attend a meeting the Sponsor wanted him to attend though [name] already attended a meeting that day and could not attend due to a family dinner. Additionally, his Sponsor suggested that [name] "step away" from [place] for 6-10 months and due AA only suggesting that Theodoro Kos is "religion" while AA is "spirituality." The group concludes that his Sponsor seems to be imposing his beliefs onto [name] and encourages him to [employer]. The therapist notes that this is an opportunity to practice asserting himself and cautions Ruthann Cancer when he says that this experience started to turn him off from the program.   [name] also talks about having received a $500 check from his mother with a "get well" card noting that he wants to send it back as he feels like there will be strings attached; however, he does not want to be "snarky." The therapist models how [name] could send this check back enclosing a note thanking his mother while saying, "no thank you" in a polite but assertive manner.  Thus, [name] is going to assert himself with his Sponsor and his mother. He has yet had time to talk to his wife about the situation at [facility] involving triggers.    Progress Towards Goals: [name] reports no alcohol use.    UDS collected: No Results: No   AA/NA attended?: Yes   Sponsor?: Yes   Adam Phenix, [place], [place], [facility], [place] 04/19/2022

## 2022-04-24 ENCOUNTER — Ambulatory Visit (INDEPENDENT_AMBULATORY_CARE_PROVIDER_SITE_OTHER): Payer: 59 | Admitting: Licensed Clinical Social Worker

## 2022-04-24 ENCOUNTER — Encounter (HOSPITAL_COMMUNITY): Payer: Self-pay | Admitting: Licensed Clinical Social Worker

## 2022-04-24 DIAGNOSIS — F1020 Alcohol dependence, uncomplicated: Secondary | ICD-10-CM

## 2022-04-24 DIAGNOSIS — F1321 Sedative, hypnotic or anxiolytic dependence, in remission: Secondary | ICD-10-CM

## 2022-04-24 DIAGNOSIS — F17200 Nicotine dependence, unspecified, uncomplicated: Secondary | ICD-10-CM

## 2022-04-24 NOTE — Addendum Note (Signed)
Addended by: Dara Hoyer on: 04/24/2022 06:01 PM   Modules accepted: Orders, Level of Service

## 2022-04-24 NOTE — Progress Notes (Signed)
Daily Group Progress Note   Program: CD IOP     Individual Time: 9 a.m. to 12 p.m.   Type of Therapy: Process and Psychoeducational    Topic: The therapist checks in with group members, assesses for SI/HI/psychosis and overall level of functioning. The therapist inquires about sobriety date and number of community support meetings attended since last session.   The therapist educates group members about aftercare options upon completing CD IOP, facilitates a discussion on what makes a good Counselor, and answers questions regarding how to talk to one's children about addiction in an age-appropriate manner. As there are two new group members, the therapist has group members tell their stories of addiction and how they came to be in CD IOP.    Summary: Ian Zavala presents rating his depression as a "2" and his anxiety as a "3."    Ian Zavala says that he went to [place] over the weekend and ran into his Sponsor who is the same person who told him to stay away from [place] for 6-10 months. He has been to three meetings saying that he is "tired" and "grateful." He is tired as he took his younger son to a wrestling even and did not get home until 1 a.m. He says that when he was at the even that he was not tempted by the beer sales around him. He spoke to one of his Sponsor's other Sponsees about his issues with his Sponsor and believes that he is going to share his concerns with his Sponsor before just letting him go.   He says that his mother messaged his wife when Ian Zavala did not return her call but he plans on calling her today and letting her know that he is not going to cash the check. Time runs out in group before Ian Zavala can share his story.    Progress Towards Goals: Ian Zavala reports no alcohol use.    UDS collected: Yes Results: Yes, negative for drugs and alcohol   AA/NA attended?: Yes   Sponsor?: Yes   Adam Phenix, [place], [place], [facility], [place] 04/24/2022

## 2022-04-26 ENCOUNTER — Ambulatory Visit (INDEPENDENT_AMBULATORY_CARE_PROVIDER_SITE_OTHER): Payer: 59 | Admitting: Licensed Clinical Social Worker

## 2022-04-26 DIAGNOSIS — F1321 Sedative, hypnotic or anxiolytic dependence, in remission: Secondary | ICD-10-CM

## 2022-04-26 DIAGNOSIS — F1020 Alcohol dependence, uncomplicated: Secondary | ICD-10-CM

## 2022-04-26 DIAGNOSIS — F17200 Nicotine dependence, unspecified, uncomplicated: Secondary | ICD-10-CM

## 2022-04-27 NOTE — Progress Notes (Signed)
Daily Group Progress Note   Program: CD IOP     Individual Time: 9 a.m. to 1 p.m.   Type of Therapy: Process and Psychoeducational    Topic: The therapist checks in with group members, assesses for SI/HI/psychosis and overall level of functioning. The therapist inquires about sobriety date and number of community support meetings attended since last session.   The therapist discusses the importance of getting quality sleep in relation to one's mental and sobriety and explains how light therapy lamps are used to treat persons with SAD. He educates group members on the fact that willpower is finite such that attempting to engage in too many activities at one time that involve use of willpower can exhaust one's reserves. The therapist educates members on what Alanon is and who can benefit from it while explaining why the disease of addiction is described as being "baffling and cunning." He suggests that people in long-term recovery can continue to attends Twelve Step meetings but additionally should work on incorporating other hobbies and activities that allow for building non-using supports and allowing for them to have fun and enjoy their sobriety.    Summary: Ian Zavala presents rating his depression as a "3" and his anxiety as a "3."    He attended two meetings since he was last in group noting that he feels "thankful" and "hopeful." Ian Zavala says that his wife has attended Alanon. 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   The group stays an hour late as another man in the group says that he wants to  hear Ian Zavala's story especially in relation to how Ian Zavala's wife is dealing with Ian Zavala's drinking and the issues it caused. During this discussion, it becomes apparent that Ian Zavala's two main issues currently are his relationship with his mother and his job. He says that he was told in the residential treatment that he would relapse if he went back to the same job but says that he cannot see a way to change. The therapist educates him about the ESC and Voc Rehab.   Ian Zavala recognizes that when his disease talks to him that it points out that he "deserves" to have a drink. The therapist also notes that his disease paints a bleak picture of the future which does not have to happen provided Ian Zavala continues to not drink and puts work into Astronomer jobs, Armed forces training and education officer.   Ian Zavala concludes at the end of group that his two biggest takeaways involve how the disease is baffling and cunning and that recovery is not a passive process but involves work.    Progress Towards Goals: Ian Zavala reports no alcohol use.    UDS collected: No Results: No   AA/NA attended?: Yes   Sponsor?: Yes   Ian Zavala, [place], [place], [facility], [place] 04/26/2022

## 2022-04-28 ENCOUNTER — Telehealth (HOSPITAL_COMMUNITY): Payer: Self-pay | Admitting: Licensed Clinical Social Worker

## 2022-04-28 ENCOUNTER — Ambulatory Visit (INDEPENDENT_AMBULATORY_CARE_PROVIDER_SITE_OTHER): Payer: 59 | Admitting: Licensed Clinical Social Worker

## 2022-04-28 DIAGNOSIS — F1321 Sedative, hypnotic or anxiolytic dependence, in remission: Secondary | ICD-10-CM

## 2022-04-28 DIAGNOSIS — F17200 Nicotine dependence, unspecified, uncomplicated: Secondary | ICD-10-CM

## 2022-04-28 DIAGNOSIS — F1020 Alcohol dependence, uncomplicated: Secondary | ICD-10-CM | POA: Diagnosis not present

## 2022-04-28 NOTE — Progress Notes (Signed)
Daily Group Progress Note   Program: CD IOP     Individual Time: 9 a.m. to 12 p.m.   Type of Therapy: Process and Psychoeducational    Topic: The therapist checks in with group members, assesses for SI/HI/psychosis and overall level of functioning. The therapist inquires about sobriety date and number of community support meetings attended since last session.   The therapist educates group members on what is meant by stages of change and what motivational interviewing is. He reminds them that things like "90 in 73" are a "suggestion" and not an absolute as people have other things in their lives to which they need to attend such as work, family, recreation, attendance to religious services, Armed forces training and education officer. He highlights the importance of keeping one's life in balance such that one does not overdo one area thus neglecting another. He explains how addiction is both chronic and progressive and that due to being progressive that the longer people use, the more difficulty they find it to have both family and addiction, work and addiction, Armed forces training and education officer. He validates that letting go of one's substance use involves grieving and that recovery requires honesty. The therapist notes the many reasons that people in recovery often say what they thing other people want to hear versus the truth. He educates them on what is meant by being "[place] sober" and how using any addictive substance can activate the addictive pathway.     The therapist shows a video that illustrates that there is no such thing as a hard versus a soft drug.    Summary: [name] presents rating his depression as a "3" and his anxiety as a "2."    He continues to question how he will work in what he perceives to be the demands of recovery into his work when he returns. The therapist continues to explain that the Twelve Step program has suggestions and not mandates and that he may have to reduce his meeting attendance if need be when he returns to work.  The therapist point out that people have to find the middle between complacency and taking on so many meetings that it is more stressful than it is helpful.   [name] says that his main goal is to keep his family and is able to see that as his disease progressed that it was impossible to time both for his family and his drinking. At the same time, he recognizes that letting go of the drinking causes him to feel grief.   In response to [name]'s complaining about how much work he has missed due to attending treatment, the therapist notes that [name] has been compelled to attend by his employer as a result of his continued drinking such that the best way to not lose his job or be sent for more treatment is to not drink. The therapist discloses that it is his hope that [name] will not drink again such that he is never again in a CD IOP or inpatient residential substance use treatment program.   [name] asks if this therapist will come see him pick up his one year to which this therapist answers in the affirmative.    Progress Towards Goals: [name] reports no alcohol use.    UDS collected: No Results: No   AA/NA attended?: Yes   Sponsor?: Yes   Adam Phenix, [place], [place], [facility], [place] 04/28/2022

## 2022-04-28 NOTE — Telephone Encounter (Signed)
Therapist completes and faxes Ian Zavala's biweekly update to his EAP.  Adam Phenix, [place], LCSW, [facility], [place] 04/28/2022

## 2022-05-01 ENCOUNTER — Ambulatory Visit (INDEPENDENT_AMBULATORY_CARE_PROVIDER_SITE_OTHER): Payer: 59 | Admitting: Licensed Clinical Social Worker

## 2022-05-01 DIAGNOSIS — F1020 Alcohol dependence, uncomplicated: Secondary | ICD-10-CM

## 2022-05-01 NOTE — Progress Notes (Signed)
Daily Group Progress Note   Program: CD IOP     Individual Time: 9 a.m. to 12 p.m.   Type of Therapy: Process and Psychoeducational    Topic: The therapist checks in with group members, assesses for SI/HI/psychosis and overall level of functioning. The therapist inquires about sobriety date and number of community support meetings attended since last session.   The therapist notes that group members who using drugs and alcohol to relax or motivate them to do things will need to learn time management skills and relaxation techniques in sobriety. The therapist educates group members on the [place] as a means of increasing motivation. He suggests that motivating one's self by focusing on the benefits of positive behavior change is more effective than focusing only on the negative outcomes of not making the change. He discusses the benefits of keeping a gratitude journal noting that this means focusing on not only the big things for which one is grateful but the little things as well.   He educates group members on what is meant by "catastrophizing" and the need to actively combat this if one is to break this habit. The therapist notes that group members should legitimately give themselves credit for being in treatment pointing that that most persons with addiction do not get treatment and many of those who do do not follow through.   The therapist observes that the group overall seems to have fallen into a rut overall in regard to energy and follow-through on treatment-related tasks.    Summary: Ian Zavala presents rating his depression as a "3" and his anxiety as a "3."    He says that what he misses most about drinking is that it "enhances things" but at the same time, he realizes that it causes negative consequences. He has been to three meetings saying that he attended a meeting on Saturday that he liked and that had some people in his age-range. People at the meeting asked him to join them  at breakfast. Ian Zavala says that he would avoid this in the past but went as he is trying to "get out of" his "comfort zone." He talks about the Sunday meeting that he continues to attend which he sounds like he does not like. Thus, the therapist suggests that Ian Zavala go to a different meeting on Sunday instead.  He says that he considered asking another guy to be his Sponsor but part of him tells him to keep with his same Sponsor; however, he admits that this part wants to keep his same Sponsor as he is "easy" and "not much of a conversationalist" so will not pursue things further if Ian Zavala tells him things are okay.  Ian Zavala admits that he views being in treatment as "punishment" for his drinking and says that this is his "last week here.' He describes his mood as "anxious" and "frustrated" noting that he is 47 years old and unhappy with his job and where he is in life. He says that his 34 year-old son reaches out to his mother about things with Ian Zavala saying that he is "not part of any of this." He attributes their lack of relationship to work and drinking saying that when his son moves out that he does not feel like he could call him do to their lack of relationship.  The therapist makes the observation that Ian Zavala tends to catastrophize about the future and expect the worse that may be something he learned from his childhood and likely protects him from  feeling disappointed while pointing out that not everything is set in stone and that he can still have a positive relationship with his son in the future.    Progress Towards Goals: Ian Zavala reports no alcohol use.    UDS collected: Yes Results: Yes, negative for drugs and alcoho   AA/NA attended?: Yes   Sponsor?: Yes   Ian Zavala, [place], [place], [facility], [place] 05/01/2022

## 2022-05-03 ENCOUNTER — Ambulatory Visit (INDEPENDENT_AMBULATORY_CARE_PROVIDER_SITE_OTHER): Payer: 59 | Admitting: Licensed Clinical Social Worker

## 2022-05-03 ENCOUNTER — Encounter (HOSPITAL_COMMUNITY): Payer: Self-pay | Admitting: Licensed Clinical Social Worker

## 2022-05-03 DIAGNOSIS — F1020 Alcohol dependence, uncomplicated: Secondary | ICD-10-CM

## 2022-05-03 DIAGNOSIS — F17200 Nicotine dependence, unspecified, uncomplicated: Secondary | ICD-10-CM

## 2022-05-03 DIAGNOSIS — F419 Anxiety disorder, unspecified: Secondary | ICD-10-CM

## 2022-05-03 DIAGNOSIS — F341 Dysthymic disorder: Secondary | ICD-10-CM

## 2022-05-03 DIAGNOSIS — F32A Depression, unspecified: Secondary | ICD-10-CM

## 2022-05-03 DIAGNOSIS — E291 Testicular hypofunction: Secondary | ICD-10-CM

## 2022-05-03 DIAGNOSIS — T7432XS Child psychological abuse, confirmed, sequela: Secondary | ICD-10-CM

## 2022-05-03 DIAGNOSIS — F989 Unspecified behavioral and emotional disorders with onset usually occurring in childhood and adolescence: Secondary | ICD-10-CM

## 2022-05-03 DIAGNOSIS — F5105 Insomnia due to other mental disorder: Secondary | ICD-10-CM

## 2022-05-03 DIAGNOSIS — F1321 Sedative, hypnotic or anxiolytic dependence, in remission: Secondary | ICD-10-CM

## 2022-05-03 DIAGNOSIS — Z6372 Alcoholism and drug addiction in family: Secondary | ICD-10-CM

## 2022-05-03 DIAGNOSIS — T50905A Adverse effect of unspecified drugs, medicaments and biological substances, initial encounter: Secondary | ICD-10-CM
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
## 2022-05-03 NOTE — Progress Notes (Signed)
Daily Group Progress Note   Program: CD IOP     Individual Time: 9 a.m. to 12 p.m.   Type of Therapy: Process and Psychoeducational    Topic: The therapist checks in with group members, assesses for SI/HI/psychosis and overall level of functioning. The therapist inquires about sobriety date and number of community support meetings attended since last session.   The therapist has group members take the PHQ-9 and GAD-7 along with a tool that provides their risk of heavy drinking in the next two months. As group members consent to discuss these results, the therapist educates them about anti-depressants, GenSight testing, and the pitfalls of benzodiazepines being used long-term. He explains the potential lethality of a benzodiazepine withdrawal.   He informs group members while getting their PHQ-9 and GAD-7 scores down to a 4 or less is desirable prior to discharge from CD IOP relating it to the first stage of relapse which is emotional relapse.   The therapist explains the technique of playing forward what will happen when one's disease tries to get the person to focus on the first drink only or the first use of the drug.    Summary: Ian Zavala presents rating his depression as a "4" and his anxiety as a "3."    He takes the PHQ-9, GAD-7, and the Aware. His PHQ-9 has dropped from a 17 to an 11. His GAD-7 is a 6. Per his results on the Aware, he has a 25% chance of heavy drinking within the next two months.  Ian Zavala says that he picks up 90 days today. He admits that he intentionally did not check in with his Sponsor to see what would happen with his Sponsor sending him a text asking him if his phone was broken.   The therapist talks with Ian Zavala about the fact that he is maxed  out on his 5 with Ian Zavala saying that the Zoloft has caused him sexual side effects which thus far he has lived with. He says that he has been tried on a number of anti-depressants over the years not being sure what worked or did  not work as he was drinking when taking many of them. He is interested in getting the GenSight testing done when he learns about it in group.   Ian Zavala says that he went to a barbecue place this weekend and the waiter offered them one of his favorite beers for free as he apparently poured it by accident. Ian Zavala notes that before he could say no to it that his wife declined it. None-the-less, Ian Zavala says that he was depressed over not having it and had to lie down when he got home.   The therapist has Ian Zavala play forward what would have happened had he drunk this beer if his wife were not there with Ian Zavala admitting that he would have gone to the brewery next door, gotten too drunk to drive, would have had to get a ride home, and his wife would have left to go stay somewhere else.    Progress Towards Goals: Ian Zavala reports no alcohol use.    UDS collected: No Results: No   AA/NA attended?: Yes   Sponsor?: Yes   Adam Phenix, [place], [place], [facility], [place] 05/03/2022

## 2022-05-05 ENCOUNTER — Ambulatory Visit (INDEPENDENT_AMBULATORY_CARE_PROVIDER_SITE_OTHER): Payer: 59 | Admitting: Licensed Clinical Social Worker

## 2022-05-05 DIAGNOSIS — F1321 Sedative, hypnotic or anxiolytic dependence, in remission: Secondary | ICD-10-CM

## 2022-05-05 DIAGNOSIS — F32A Depression, unspecified: Secondary | ICD-10-CM

## 2022-05-05 DIAGNOSIS — F17200 Nicotine dependence, unspecified, uncomplicated: Secondary | ICD-10-CM

## 2022-05-05 DIAGNOSIS — F1020 Alcohol dependence, uncomplicated: Secondary | ICD-10-CM | POA: Diagnosis not present

## 2022-05-05 NOTE — Progress Notes (Signed)
Daily Group Progress Note   Program: CD IOP     Individual Time: 9 a.m. to 12 p.m.   Type of Therapy: Process and Psychoeducational    Topic: The therapist checks in with group members, assesses for SI/HI/psychosis and overall level of functioning. The therapist inquires about sobriety date and number of community support meetings attended since last session.   The therapist has group members tell their stories while doing group check-in as there is a new group member. There is a Wellsite geologist in group who is in recovery and knows one of the group members having worked with her years ago. At the group's request, he shares his story and provides some of the fundamentals of recovery such as not isolating and reaching out to people for help. He also emphasizes that one does not necessarily have to be rigid and/or dogmatic with recovery as some old-timers push.   The therapist discusses the need to have leisure activities and time for rest built into one's recovery and the reason that it is important in early recovery to focus on being smart and not strong.     Summary: Ian Zavala presents rating his depression as a "3" and his anxiety as a "3."    He says that his Sponsor has "chilled out" as of late. Ian Zavala says that he feels "anxious" and "insecure" saying that he attended a Church men's group at the insistence of his father-in-law. Ian Zavala says that he noticed that he had a hard time talking to people about "normal" things saying that he is more comfortable at an [place].   He declined a friend's 50th birthday invitation as this was a friend with whom he drank. [name]'s wife is friends with the friend's wife and also declined such that Ian Zavala felt guilty. The therapist emphasizes that Ian Zavala is doing exactly what he must given where he is in early recovery and that he is being smart rather than strong which is a good thiing.   In discussing his story, he mentions that he sometimes thinks that "if I just  reeled it in" last year in regard to his drinking that he could be watching the [place] while drinking. The therapist suggests that if Ian Zavala were able to "reel it in" that he would not be an alcoholic.   In discussing his story, Ian Zavala recognizes the progression of his disease  over time.   Progress Towards Goals: Ian Zavala reports no alcohol use.    UDS collected: No Results: Yes, negative for drugs and alcohol   AA/NA attended?: Yes   Sponsor?: Yes   Adam Phenix, [place], [place], [facility], [place] 05/05/2022

## 2022-05-08 ENCOUNTER — Ambulatory Visit (INDEPENDENT_AMBULATORY_CARE_PROVIDER_SITE_OTHER): Payer: 59 | Admitting: Licensed Clinical Social Worker

## 2022-05-08 ENCOUNTER — Encounter: Payer: Self-pay | Admitting: Medical

## 2022-05-08 DIAGNOSIS — F17200 Nicotine dependence, unspecified, uncomplicated: Secondary | ICD-10-CM

## 2022-05-08 DIAGNOSIS — F1321 Sedative, hypnotic or anxiolytic dependence, in remission: Secondary | ICD-10-CM

## 2022-05-08 DIAGNOSIS — F1020 Alcohol dependence, uncomplicated: Secondary | ICD-10-CM

## 2022-05-08 DIAGNOSIS — F32A Depression, unspecified: Secondary | ICD-10-CM

## 2022-05-08 NOTE — Progress Notes (Signed)
Daily Group Progress Note   Program: [facility] IOP     Individual Time: 9 a.m. to 12 p.m.   Type of Therapy: Process and Psychoeducational    Topic: The therapist checks in with group members, assesses for SI/HI/psychosis and overall level of functioning. The therapist inquires about sobriety date and number of community support meetings attended since last session.    The therapist answers questions from group members regarding drugs of abuse with the therapist. The therapist explains that benzodiazepines are contraindicated for persons with alcohol use disorders and were initially intended for short-terms use to treat acute anxiety as opposed to being a long-term treatment option for chronic anxiety. The therapist shows videos on what is PAWS and on the role of self-compassion in recovery.    Summary: Ian Zavala presents rating his depression as a "4" and his anxiety as a "7."    He has been attending meetings daily saying that he felt"depressed" and "remorseful"  when he first came to group today. He feels remorseful about mistakes he made in the past as a result of drinking. After coming to group, he now feels "accepted" and "supported."   He says that he is likely going to be stressed out the next few weeks. He says that he likes speaker meetings and "Bill's stories" meeting but is not a fan of open discussion groups and he likes large versus small meetings as he feels compelled to share in very small meetings.   He says that his Sponsor  has told him to get a home group saying to pick one at which he feels most comfortable; however, Ian Zavala cannot identify a specific one that fits the bill.  Ian Zavala says that he had to leave Church early due to anxiety apparently being overwhelmed by meetings, [place], and family events. He says that he will sometimes sleep a few hours not so much because he is tired but to give his brain a break from overstimulation.  The therapist observes that Ian Zavala is an introvert  normalizing this and the need for more solitary time as a result. Ian Zavala says that he can leave events early; however, feels guilty as if he leaves then his wife will leave also apparently concerned what Ian Zavala would do at home unaccompanied. Ian Zavala says that he feels like the family views him as being the "sick person" but acknowledges that this may be his own projection.  He says that he saw a video of the people at the friend's 50th birthday party drinking and singing Ian Zavala. The therapist notes that Ian Zavala seems to feel like he is missing something. Ian Zavala says that his disease will sometimes say "f__ it" thinking that if his wife were to leave him for a brief period that he would be able to drink; however, when encouraged to play the story forward is able to realize that this would lead to job loss and worse.  Progress Towards Goals: Ian Zavala reports no alcohol use.    UDS collected: Yes Results: No   AA/NA attended?: Yes   Sponsor?: Yes   Ian Zavala, [place], [place], [facility], [place] 05/08/2022

## 2022-05-10 ENCOUNTER — Ambulatory Visit (INDEPENDENT_AMBULATORY_CARE_PROVIDER_SITE_OTHER): Payer: 59 | Admitting: Licensed Clinical Social Worker

## 2022-05-10 ENCOUNTER — Ambulatory Visit (INDEPENDENT_AMBULATORY_CARE_PROVIDER_SITE_OTHER): Payer: 59 | Admitting: Nurse Practitioner

## 2022-05-10 ENCOUNTER — Encounter: Payer: Self-pay | Admitting: Nurse Practitioner

## 2022-05-10 VITALS — BP 100/70 | HR 66 | Temp 98.1°F | Ht 76.0 in | Wt 258.2 lb

## 2022-05-10 DIAGNOSIS — E782 Mixed hyperlipidemia: Secondary | ICD-10-CM

## 2022-05-10 DIAGNOSIS — Z79899 Other long term (current) drug therapy: Secondary | ICD-10-CM

## 2022-05-10 DIAGNOSIS — F1090 Alcohol use, unspecified, uncomplicated: Secondary | ICD-10-CM

## 2022-05-10 DIAGNOSIS — E559 Vitamin D deficiency, unspecified: Secondary | ICD-10-CM

## 2022-05-10 DIAGNOSIS — F1020 Alcohol dependence, uncomplicated: Secondary | ICD-10-CM | POA: Diagnosis not present

## 2022-05-10 DIAGNOSIS — I10 Essential (primary) hypertension: Secondary | ICD-10-CM

## 2022-05-10 DIAGNOSIS — F17200 Nicotine dependence, unspecified, uncomplicated: Secondary | ICD-10-CM

## 2022-05-10 DIAGNOSIS — F418 Other specified anxiety disorders: Secondary | ICD-10-CM

## 2022-05-10 DIAGNOSIS — F1321 Sedative, hypnotic or anxiolytic dependence, in remission: Secondary | ICD-10-CM

## 2022-05-10 DIAGNOSIS — K219 Gastro-esophageal reflux disease without esophagitis: Secondary | ICD-10-CM

## 2022-05-10 DIAGNOSIS — E663 Overweight: Secondary | ICD-10-CM

## 2022-05-10 DIAGNOSIS — F32A Depression, unspecified: Secondary | ICD-10-CM

## 2022-05-10 DIAGNOSIS — R7309 Other abnormal glucose: Secondary | ICD-10-CM | POA: Diagnosis not present

## 2022-05-10 MED ORDER — OMEPRAZOLE 40 MG PO CPDR: DELAYED_RELEASE_CAPSULE | ORAL | 1 refills | Status: DC

## 2022-05-10 MED ORDER — GABAPENTIN 300 MG PO CAPS: 300.0000 mg | ORAL_CAPSULE | Freq: Three times a day (TID) | ORAL | 0 refills | Status: DC

## 2022-05-10 MED ORDER — ROSUVASTATIN CALCIUM 20 MG PO TABS: ORAL_TABLET | ORAL | 1 refills | Status: DC

## 2022-05-10 NOTE — Progress Notes (Signed)
Assessment and Plan:    Essential hypertension Below goal in clinic today. Record in home BP readings Discussed medication adjustment if BP continues to remain below goal of <100/80 Continue DASH (Dietary Approaches to Stop Hypertension) Remain active and exercise as tolerated daily.   Alcohol use disorder Continue Gabapentin, Naltrexone Continue IOP program Continue AA meetings  Gastroesophageal reflux disease, unspecified whether esophagitis present Continue Omeprazole Lifestyle modification:  wt loss, avoid meals 2-3h before bedtime. Consider eliminating food triggers:  chocolate, caffeine, EtOH, acid/spicy food.  Abnormal glucose Education: Reviewed 'ABCs' of diabetes management  Discussed goals to be met and/or maintained include A1C (<7) Blood pressure (<130/80) Cholesterol (LDL <70) Continue Eye Exam yearly  Continue Dental Exam Q6 mo Discussed dietary recommendations Discussed Physical Activity recommendations Check A1C  Hyperlipidemia, mixed Continue Rosuvastatin Discussed lifestyle modifications. Recommended diet heavy in fruits and veggies, omega 3's. Decrease consumption of animal meats, cheeses, and dairy products. Remain active and exercise as tolerated. Continue to monitor. Check lipids/TSH  - Lipid panel  Vitamin D deficiency Continue supplement for a goal of 60-100 Monitor levels  Overweight (BMI 25.0-29.9) Discussed appropriate BMI Diet modification. Physical activity. Encouraged/praised to build confidence.  Depression with anxiety Continue buspirone, gabapentin, sertraline, hydroxyzine, propranolol. Continue AA meetings, IOP Reviewed relaxation techniques.  Sleep hygiene. Recommended mindfulness meditation and exercise.   Encouraged personality growth wand development through coping techniques and problem-solving skills. Limit/Decrease/Monitor drug/alcohol intake.    Medication management All medications discussed and reviewed in  full. All questions and concerns regarding medications addressed.    Orders Placed This Encounter  Procedures   Lipid panel   Notify office for further evaluation and treatment, questions or concerns if any reported s/s fail to improve.   The patient was advised to call back or seek an in-person evaluation if any symptoms worsen or if the condition fails to improve as anticipated.   Further disposition pending results of labs. Discussed med's effects and SE's.    I discussed the assessment and treatment plan with the patient. The patient was provided an opportunity to ask questions and all were answered. The patient agreed with the plan and demonstrated an understanding of the instructions.  Discussed med's effects and SE's. Screening labs and tests as requested with regular follow-up as recommended.  I provided 20 minutes of face-to-face time during this encounter including counseling, chart review, and critical decision making was preformed.  Future Appointments  Date Time Provider [place]  05/12/2022  9:00 AM [facility] None  05/15/2022  9:00 AM [facility] None  07/03/2022  4:00 PM Unk Pinto, MD [facility] None  01/01/2023  3:00 PM Unk Pinto, MD [facility] None      HPI 47 y.o. male  presents for 3 month follow up on hypertension, cholesterol, glucose management, hypogonadism, overweight and vitamin D deficiency, alcohol abuse.  He has recently returned home from two rehab programs for alcoholism.  First went to [place] for 60 days, relapsed and recently returned from [place] on 03/05/22 for 30 days. He is now invested in the [facility] program and continuing to attend meetings.  He continues to remain alcohol free. Taking Naltrexone. He continues to follow with AA and IOP.  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   He is taking sertraline 50 mg daily for mood. He  is no longer taking Xanax.  He was weaned during his 97 day rehab period  in [place].   Continues to take buspirone, gabapentin, hydroxyzine, propranolol.  He is on CPAP for OSA.   GERD on omeprazole taking regularly, denies reflux, belching, coughing.   BMI is Body mass index is 31.43 kg/m., he has not been working on diet and exercise Wt Readings from Last 3 Encounters:  05/10/22 258 lb 3.2 oz (117.1 kg)  04/03/22 257 lb 6.4 oz (116.8 kg)  01/16/22 257 lb 6.4 oz (116.8 kg)    He has BP cuff but hasn't been checking at home, today their BP is BP: 100/70, similar on recheck. Taking bisoprolol 10 mg, enalapril 40 mg daily, clonidine.   He does not workout He denies chest pain, shortness of breath, dizziness.   He is on cholesterol medication, WAS STOPPED DUE TO LIVER ENZYMES, has restarted and prescribed rosuvastatin 20 mg three days a week but reports GI upset after taking, only once a week or so. His cholesterol is not at goal. He admits may not drink during the week but then 12 pack on the weekend.  The cholesterol last visit was:   Lab Results  Component Value Date   CHOL 291 (H) 04/03/2022   HDL 36 (L) 04/03/2022   LDLCALC 188 (H) 04/03/2022   TRIG 393 (H) 04/03/2022   CHOLHDL 8.1 (H) 04/03/2022   Last A1C in the office was:  Lab Results  Component Value Date   HGBA1C 5.7 (H) 04/03/2022   Patient is on Vitamin D supplement.   Lab Results  Component Value Date   VD25OH 59 04/03/2022    Current Medications:  Current Outpatient Medications on File Prior to Visit  Medication Sig   bisoprolol (ZEBETA) 10 MG tablet TAKE 1 TABLET DAILY FOR BLOOD PRESSURE   busPIRone (BUSPAR) 10 MG tablet Take 1 tablet 2 to 3 x /day for Anxiety   cholecalciferol (VITAMIN D) 1000 UNITS tablet Take 1,000 Units by mouth daily. Take 5000 IU daily.   enalapril (VASOTEC) 20 MG tablet TAKE 2 TABLETS DAILY FOR BLOOD PRESSURE   hydrochlorothiazide (HYDRODIURIL) 25 MG tablet TAKE 1 TABLET EVERY MORNINGFOR  BLOOD PRESSURE AND     FLUID RETENTION   hydrOXYzine (ATARAX) 25 MG tablet Take  1 tablet  2 to 3 x /day  as needed for Anxiety   Magnesium 400 MG CAPS Take 1 capsule by mouth daily.   naltrexone (DEPADE) 50 MG tablet Take  1 tablet  Daily  to Suppress Alcohol Cravings   Omega-3 Fatty Acids (FISH OIL) 1000 MG CAPS Take 2,000 mg by mouth daily.   propranolol (INDERAL) 10 MG tablet Take  1 tablet  3 to 4 x /day  as needed for Acute Anxiety or Panic Attack   sertraline (ZOLOFT) 100 MG tablet TAKE 1/2 TO 1 TABLET DAILY FOR MOOD AND ANXIETY (Patient taking differently: 150 mg. TAKE 1/2 TO 1 TABLET DAILY FOR MOOD AND ANXIETY)   testosterone cypionate (DEPOTESTOSTERONE CYPIONATE) 200 MG/ML injection Inject 1 ml into Muscle every 7 days   traZODone (DESYREL) 150 MG tablet Take  1/2 to 1 tablet  1 to 2 hours  before Bedtime  as needed for Sleep   cloNIDine (CATAPRES) 0.1 MG tablet Take by mouth. (Patient not taking: Reported on 05/10/2022)   No current facility-administered medications on file prior to visit.    Medical History:  Past Medical History:  Diagnosis Date   Anxiety    Depression    GERD (gastroesophageal reflux disease)    Hyperlipidemia    Hypertension  Hypogonadism male    Obesity    OSA on CPAP 2019   Vitamin D deficiency    Allergies:  Allergies  Allergen Reactions   Citalopram Other (See Comments)    anxiety   Pravastatin    Wellbutrin [Bupropion]      Review of Systems:  Review of Systems  Constitutional: Negative.  Negative for malaise/fatigue and weight loss.  HENT: Negative.  Negative for hearing loss and tinnitus.   Eyes: Negative.  Negative for blurred vision and double vision.  Respiratory: Negative.  Negative for cough, shortness of breath and wheezing.   Cardiovascular: Negative.  Negative for chest pain, palpitations, orthopnea, claudication and leg swelling.  Gastrointestinal:  Negative for abdominal pain, blood in stool, constipation, diarrhea,  heartburn, melena, nausea and vomiting.  Genitourinary: Negative.   Musculoskeletal: Negative.  Negative for joint pain and myalgias.  Skin: Negative.  Negative for rash.  Neurological: Negative.  Negative for dizziness, tingling, sensory change, weakness and headaches.  Endo/Heme/Allergies: Negative.  Negative for polydipsia.  Psychiatric/Behavioral: Negative.    All other systems reviewed and are negative.   Family history- Review and unchanged Social history- Review and unchanged Physical Exam: BP 100/70   Pulse 66   Temp 98.1 F (36.7 C)   Ht 6\' 4"  (1.93 m)   Wt 258 lb 3.2 oz (117.1 kg)   SpO2 99%   BMI 31.43 kg/m  Wt Readings from Last 3 Encounters:  05/10/22 258 lb 3.2 oz (117.1 kg)  04/03/22 257 lb 6.4 oz (116.8 kg)  01/16/22 257 lb 6.4 oz (116.8 kg)   General Appearance: Well nourished, in no apparent distress. Eyes: PERRLA, EOMs, conjunctiva no swelling or erythema Sinuses: No Frontal/maxillary tenderness ENT/Mouth: Ext aud canals clear, TMs without erythema, bulging. No erythema, swelling, or exudate on post pharynx.  Tonsils not swollen or erythematous. Hearing normal.  Neck: Supple, thyroid normal.  Respiratory: Respiratory effort normal, BS equal bilaterally without rales, rhonchi, wheezing or stridor.  Cardio: RRR with no MRGs. Brisk peripheral pulses without edema.  Abdomen: Soft, + BS,  Non tender, no guarding, rebound, hernias, masses. Lymphatics: Non tender without lymphadenopathy.  Musculoskeletal: Full ROM, 5/5 strength, Normal gait Skin: Warm, dry without rashes, lesions, ecchymosis.  Neuro: Cranial nerves intact. Normal muscle tone, no cerebellar symptoms. Psych: Awake and oriented X 3, normal affect, Insight and Judgment appropriate.    Darrol Jump, NP 3:08 PM [facility]

## 2022-05-10 NOTE — Progress Notes (Signed)
Daily Group Progress Note   Program: [facility] IOP     Individual Time: 9 a.m. to 12 p.m.   Type of Therapy: Process and Psychoeducational    Topic: The therapist checks in with group members, assesses for SI/HI/psychosis and overall level of functioning. The therapist inquires about sobriety date and number of community support meetings attended since last session.    The facilitates discussions on what is meant by "HALT" in Twelve Step meetings, what acceptance and commitment therapy is, and the fact that most people who obtain long-term recovery are not "one chip wonders." He continues to normalize having mood struggles and boredom in year one of recovery discussing how drug and alcohol depletes a person's dopamine levels such that they tend to only feel normal when using. He normalizes feeling guilty about one's recovery when one's loved ones do not recovery and remain actively in addiction while cautioning against engaging in the "rescue fantasy." He instills hope that after year one of recovery that life typically becomes so fulfilling that remaining abstinent is not so much of a struggle as the rewards of recovery are reinforcing.    Summary: [name] presents rating his depression as a "5" and his anxiety as a "4."    He continues to attend meetings and meet with his Sponsor; however, he admits that he feels "apathetic" and the therapist observes that [name] is experiencing anhedonia. He also is anxious about going back to work knowing that it will greatly increase his stress level.  The therapist talks with [name] about discharge noting that he could leave anytime that he wants; however, it would be preferable that when he does leave that his depression is better controlled after a possible medication change once his [facility] results come back.   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   [name] admits that he knows that if he were to drink that for a short period of time that he would no longer feel apathetic and unable to enjoy things. He says that it helps to know that there is a light at the end of the tunnel.   Progress Towards Goals: [name] reports no alcohol use.    UDS collected: No Results: No   AA/NA attended?: Yes   Sponsor?: Yes   Adam Phenix, [place], [place], [facility], [place] 05/10/2022

## 2022-05-10 NOTE — Patient Instructions (Signed)
Exercising to Stay Healthy To become healthy and stay healthy, it is recommended that you do moderate-intensity and vigorous-intensity exercise. You can tell that you are exercising at a moderate intensity if your heart starts beating faster and you start breathing faster but can still hold a conversation. You can tell that you are exercising at a vigorous intensity if you are breathing much harder and faster and cannot hold a conversation while exercising. How can exercise benefit me? Exercising regularly is important. It has many health benefits, such as: Improving overall fitness, flexibility, and endurance. Increasing bone density. Helping with weight control. Decreasing body fat. Increasing muscle strength and endurance. Reducing stress and tension, anxiety, depression, or anger. Improving overall health. What guidelines should I follow while exercising? Before you start a new exercise program, talk with your health care provider. Do not exercise so much that you hurt yourself, feel dizzy, or get very short of breath. Wear comfortable clothes and wear shoes with good support. Drink plenty of water while you exercise to prevent dehydration or heat stroke. Work out until your breathing and your heartbeat get faster (moderate intensity). How often should I exercise? Choose an activity that you enjoy, and set realistic goals. Your health care provider can help you make an activity plan that is individually designed and works best for you. Exercise regularly as told by your health care provider. This may include: Doing strength training two times a week, such as: Lifting weights. Using resistance bands. Push-ups. Sit-ups. Yoga. Doing a certain intensity of exercise for a given amount of time. Choose from these options: A total of 150 minutes of moderate-intensity exercise every week. A total of 75 minutes of vigorous-intensity exercise every week. A mix of moderate-intensity and  vigorous-intensity exercise every week. Children, pregnant women, people who have not exercised regularly, people who are overweight, and older adults may need to talk with a health care provider about what activities are safe to perform. If you have a medical condition, be sure to talk with your health care provider before you start a new exercise program. What are some exercise ideas? Moderate-intensity exercise ideas include: Walking 1 mile (1.6 km) in about 15 minutes. Biking. Hiking. Golfing. Dancing. Water aerobics. Vigorous-intensity exercise ideas include: Walking 4.5 miles (7.2 km) or more in about 1 hour. Jogging or running 5 miles (8 km) in about 1 hour. Biking 10 miles (16.1 km) or more in about 1 hour. Lap swimming. Roller-skating or in-line skating. Cross-country skiing. Vigorous competitive sports, such as football, basketball, and soccer. Jumping rope. Aerobic dancing. What are some everyday activities that can help me get exercise? Yard work, such as: Pushing a lawn mower. Raking and bagging leaves. Washing your car. Pushing a stroller. Shoveling snow. Gardening. Washing windows or floors. How can I be more active in my day-to-day activities? Use stairs instead of an elevator. Take a walk during your lunch break. If you drive, park your car farther away from your work or school. If you take public transportation, get off one stop early and walk the rest of the way. Stand up or walk around during all of your indoor phone calls. Get up, stretch, and walk around every 30 minutes throughout the day. Enjoy exercise with a friend. Support to continue exercising will help you keep a regular routine of activity. Where to find more information You can find more information about exercising to stay healthy from: U.S. Department of Health and Human Services: www.hhs.gov Centers for Disease Control and Prevention (  CDC): www.cdc.gov Summary Exercising regularly is  important. It will improve your overall fitness, flexibility, and endurance. Regular exercise will also improve your overall health. It can help you control your weight, reduce stress, and improve your bone density. Do not exercise so much that you hurt yourself, feel dizzy, or get very short of breath. Before you start a new exercise program, talk with your health care provider. This information is not intended to replace advice given to you by your health care provider. Make sure you discuss any questions you have with your health care provider. Document Revised: 06/04/2020 Document Reviewed: 06/04/2020 Elsevier Patient Education  2023 Elsevier Inc.  

## 2022-05-11 LAB — LIPID PANEL
Cholesterol: 191 mg/dL (ref <200)
HDL: 43 mg/dL (ref >=40)
LDL Cholesterol (Calc): 111 mg/dL (calc) — ABNORMAL HIGH
Non-HDL Cholesterol (Calc): 148 mg/dL (calc) — ABNORMAL HIGH (ref <130)
Total CHOL/HDL Ratio: 4.4 (calc) (ref <5.0)
Triglycerides: 241 mg/dL — ABNORMAL HIGH (ref <150)

## 2022-05-12 ENCOUNTER — Encounter (HOSPITAL_COMMUNITY): Payer: Self-pay | Admitting: Medical

## 2022-05-12 ENCOUNTER — Ambulatory Visit (INDEPENDENT_AMBULATORY_CARE_PROVIDER_SITE_OTHER): Payer: 59 | Admitting: Medical

## 2022-05-12 DIAGNOSIS — F1020 Alcohol dependence, uncomplicated: Secondary | ICD-10-CM

## 2022-05-12 DIAGNOSIS — Z6372 Alcoholism and drug addiction in family: Secondary | ICD-10-CM

## 2022-05-12 DIAGNOSIS — F341 Dysthymic disorder: Secondary | ICD-10-CM

## 2022-05-12 DIAGNOSIS — T7432XS Child psychological abuse, confirmed, sequela: Secondary | ICD-10-CM

## 2022-05-12 NOTE — Progress Notes (Signed)
Patient ID: Ian Zavala, male   DOB: [birth year], 47 y.o.   MRN: [id number] Bill Salinas report back-pt given copy to review FU Monday

## 2022-05-12 NOTE — Progress Notes (Signed)
Daily Group Progress Note   Program: [facility] IOP     Individual Time: 9 a.m. to 12 p.m.   Type of Therapy: Process and Psychoeducational    Topic: The therapist checks in with group members, assesses for SI/HI/psychosis and overall level of functioning. The therapist inquires about sobriety date and number of community support meetings attended since last session.    The facilitates discussions on a number of different topics such as the need to preferably having a non-substance-using person to remove any drug or alcohol stashes to avoid contact with them, the pros and cons of virtual Twelve Step meetings, how to find free, 12 Step worksheets on-line, how to use painful memories associated with past using as a motivation versus sending someone into a shame spiral, and the reason that people experience alcohol-related blackouts. The therapist also educates group members concerning while monotherapy with an anti-depressant is contraindicated in persons with a bipolar spectrum disorder. The therapist also explains why some people need to stay on anti-depressant medication indefinitely. The therapist presents the following distress tolerance skills: STOP, Pros and Cons, and Changing Your Body.    Summary: Ian Zavala presents rating his depression as a "4" and his anxiety as a "4."    He says that he continues to attend a meeting her day working on doing 90 in 35. 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   He says that his Sponsor told him to take his anti-depressants "for now" but suggested that  he wanted Ian Zavala to work on getting off them by a year talking about an ex who was hospitalized and on anti-depressants. The therapist counters his Sponsor's misinformation noting that some people must stay on anti-depressants indefinitely and should never come off them and the reasons for this.   Progress Towards Goals: Ian Zavala reports no alcohol use.    UDS collected: No Results: Yes, negative for drugs and alcohol   AA/NA attended?: Yes   Sponsor?: Yes   Adam Phenix, [place], [place], [facility], [place] 05/12/2022

## 2022-05-15 ENCOUNTER — Telehealth (HOSPITAL_COMMUNITY): Payer: Self-pay | Admitting: Licensed Clinical Social Worker

## 2022-05-15 ENCOUNTER — Other Ambulatory Visit (INDEPENDENT_AMBULATORY_CARE_PROVIDER_SITE_OTHER): Payer: 59 | Admitting: Medical

## 2022-05-15 ENCOUNTER — Other Ambulatory Visit (HOSPITAL_COMMUNITY): Payer: Self-pay | Admitting: Medical

## 2022-05-15 ENCOUNTER — Ambulatory Visit (HOSPITAL_COMMUNITY): Payer: 59 | Admitting: Licensed Clinical Social Worker

## 2022-05-15 DIAGNOSIS — F3181 Bipolar II disorder: Secondary | ICD-10-CM

## 2022-05-15 DIAGNOSIS — T7432XS Child psychological abuse, confirmed, sequela: Secondary | ICD-10-CM

## 2022-05-15 DIAGNOSIS — E291 Testicular hypofunction: Secondary | ICD-10-CM

## 2022-05-15 DIAGNOSIS — F99 Mental disorder, not otherwise specified: Secondary | ICD-10-CM

## 2022-05-15 DIAGNOSIS — F5105 Insomnia due to other mental disorder: Secondary | ICD-10-CM

## 2022-05-15 DIAGNOSIS — F1020 Alcohol dependence, uncomplicated: Secondary | ICD-10-CM

## 2022-05-15 DIAGNOSIS — F419 Anxiety disorder, unspecified: Secondary | ICD-10-CM

## 2022-05-15 DIAGNOSIS — Z6372 Alcoholism and drug addiction in family: Secondary | ICD-10-CM

## 2022-05-15 DIAGNOSIS — F17200 Nicotine dependence, unspecified, uncomplicated: Secondary | ICD-10-CM

## 2022-05-15 DIAGNOSIS — F1321 Sedative, hypnotic or anxiolytic dependence, in remission: Secondary | ICD-10-CM

## 2022-05-15 DIAGNOSIS — F341 Dysthymic disorder: Secondary | ICD-10-CM

## 2022-05-15 MED ORDER — LAMOTRIGINE 25 MG PO TABS: ORAL_TABLET | ORAL | 0 refills | Status: DC

## 2022-05-15 MED ORDER — LAMOTRIGINE 200 MG PO TABS: 200.0000 mg | ORAL_TABLET | Freq: Every day | ORAL | 2 refills | Status: DC

## 2022-05-15 NOTE — Progress Notes (Signed)
Daily Group Progress Note   Program: CD IOP     Individual Time: 9 a.m. to 12 p.m.   Type of Therapy: Process and Psychoeducational    Topic: The therapist checks in with group members, assesses for SI/HI/psychosis and overall level of functioning. The therapist inquires about sobriety date and number of community support meetings attended since last session.    The therapist explains that roughly 70% of people currently in jail are there because of addiction and that the Criminal Justice system wants these people to abstain and get treatment knowing that if they were to get treatment that they would likely not see them again.  The therapist continues to present material on distress tolerance covering Distracting, Self-Soothing, and Improving the Moment techniques encouraging group members to consider which skills they already use effectively and which ones with which they struggle or do not find helpful. The therapist explains that a skill that works well for one person might have an adverse impact for another as people are different in regards to coping mechanisms.    Summary: [name] presents rating his depression as a "4" and his anxiety as a "4."    He says that he feels "grateful." [place] for his wife's birthday which allowed him to briefly see his brother who lives in the area. He says that he attended an "interesting" meeting while there. His brother recommended a good pizza place where [name] could eat but [name] says that when he got there that it had a "bar type" atmosphere. He opted to stay saying that he had "no itching for a drink" saying that playing the story forward continues to work for him.  [name] says that engaging in activities works in regard to distress tolerance; however, contributing does not as this involves contact with people with [name] enjoying more solitary time when overwhelmed.   [name] meets with the P.A. today who changes  his diagnosis to Bipolar II Disorder and is to start [name] on Lamictal.   Progress Towards Goals: [name] reports no alcohol use.    UDS collected: Yes Results: No   AA/NA attended?: Yes   Sponsor?: Yes   Adam Phenix, [place], [place], [facility], [place] 05/15/2022

## 2022-05-15 NOTE — Telephone Encounter (Signed)
The therapist calls Ms. Fllint, LMSW leaving a voicemail about "Ian Zavala" explaining that his leave date will almost certainly need to be extended beyond 05/25/22 with the goal to titrate him up to around 200 mg of Lamictal which can take several weeks.  The therapist also asks if Ms. [place], LMSW could call him to discuss this in addition to see if [employer] has any ability to have this patient moved from 2nd to 1st shift upon his return to work.  The therapist leaves his direct callback number.  Adam Phenix, [place], LCSW, [facility], [place] 05/15/2022

## 2022-05-15 NOTE — Telephone Encounter (Signed)
The therapist receives the following email from [employer], [place], [employer], [place]   Senior Employee Assistance Counselor:  "Hello Ian Zavala,     Thank you for your continued updates regarding our mutual client, Ian Zavala. I am glad to hear he is doing well in the program and actively attending. I do have some concerns regarding his PHQ-9 score as his depression symptoms in the past have been a trigger for him drinking. Please let me know how the changing of the medication helps. Previously his leave paperwork had an estimate return date of 4/2. Do you think he will need to be extended?     Warmest regards,"  Adam Phenix, [place], [place], [facility], [place] 05/15/2022
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
## 2022-05-17 ENCOUNTER — Ambulatory Visit (INDEPENDENT_AMBULATORY_CARE_PROVIDER_SITE_OTHER): Payer: 59 | Admitting: Licensed Clinical Social Worker

## 2022-05-17 ENCOUNTER — Telehealth (HOSPITAL_COMMUNITY): Payer: Self-pay | Admitting: Licensed Clinical Social Worker

## 2022-05-17 DIAGNOSIS — F3181 Bipolar II disorder: Secondary | ICD-10-CM

## 2022-05-17 DIAGNOSIS — F1321 Sedative, hypnotic or anxiolytic dependence, in remission: Secondary | ICD-10-CM

## 2022-05-17 DIAGNOSIS — F17200 Nicotine dependence, unspecified, uncomplicated: Secondary | ICD-10-CM

## 2022-05-17 DIAGNOSIS — F1020 Alcohol dependence, uncomplicated: Secondary | ICD-10-CM

## 2022-05-17 NOTE — Telephone Encounter (Signed)
The therapist leaves Ian Zavala a HIPAA-compliant voicemail letting him know that he got the "lab situation" taken care of.  Adam Phenix, [place], LCSW, [facility], [place] 05/17/2022

## 2022-05-17 NOTE — Progress Notes (Signed)
Daily Group Progress Note   Program: CD IOP     Individual Time: 9 a.m. to 12 p.m.   Type of Therapy: Process and Psychoeducational    Topic: The therapist checks in with group members, assesses for SI/HI/psychosis and overall level of functioning. The therapist inquires about sobriety date and number of community support meetings attended since last session.    The therapist reiterates the benefits of exercise in reducing PAWS while encouraging getting back into activity slowly. He continues to present materials on distress tolerance covering the skill, radical acceptance.     Summary: Ian Zavala presents rating his depression as a "2" and his anxiety as a "3."    He notes today that he "actually" feels "pretty good" and has an uncharacteristically bright affect. He says that he is "optimistic" and "thankful" noting that the new medication he is taking seems to have started "turning on" something in his brain.   Ian Zavala says that he was uncharacteristically happy and wanting to do stuff with his wife. He says that he is now dreaming every night with the therapist explaining that the reason he did not dream in the past was likely alcohol interfering with his REM sleep.   He still has some anxiety about returning to work which the therapist normalizes. Ian Zavala says that the radical acceptance skill is especially useful for him as this is something that he has always struggled with.   Progress Towards Goals: Ian Zavala reports no alcohol use.    UDS collected: No Results: No   AA/NA attended?: Yes   Sponsor?: Yes   Adam Phenix, [place], [place], [facility], [place] 05/17/2022

## 2022-05-19 ENCOUNTER — Ambulatory Visit (INDEPENDENT_AMBULATORY_CARE_PROVIDER_SITE_OTHER): Payer: 59 | Admitting: Licensed Clinical Social Worker

## 2022-05-19 DIAGNOSIS — F17200 Nicotine dependence, unspecified, uncomplicated: Secondary | ICD-10-CM

## 2022-05-19 DIAGNOSIS — F1321 Sedative, hypnotic or anxiolytic dependence, in remission: Secondary | ICD-10-CM

## 2022-05-19 DIAGNOSIS — F3181 Bipolar II disorder: Secondary | ICD-10-CM

## 2022-05-19 DIAGNOSIS — F1020 Alcohol dependence, uncomplicated: Secondary | ICD-10-CM

## 2022-05-19 NOTE — Progress Notes (Signed)
Daily Group Progress Note   Program: CD IOP     Individual Time: 9 a.m. to 12 p.m.   Type of Therapy: Process and Psychoeducational    Topic: The therapist checks in with group members, assesses for SI/HI/psychosis and overall level of functioning. The therapist inquires about sobriety date and number of community support meetings attended since last session.    The therapist shares a chart with a modified Jellinek Curve using this to illustrate how addiction is progressive disease but one that can put put indefinitely into remission with the cessation of drug and/or alcohol use. He answers group members' questions about the "rat brain" providing information on the biology of addiction. He explains the differences between helping and enabling and what is meant by "tough love" and emphasizes the importance of family involvement in substance abuse treatment while explaining what Alanon is. He normalizes people in early recovery being impatient and wanting everything to happen now and suggests how using the mantras, "easy does it" or "[place] wasn't built in a day" can help people remember the need for patience and to focus on the here-and-now.    Summary: Ian Zavala presents rating his depression as a "2" and his anxiety as a "3."    He says that he feels bad that when he shares that it seems to take up so much of group; however, the therapist notes that Ian Zavala's shares are honest and bring up issues common to all in group. For example, he observes that Ian Zavala's talking about his mother and his childhood led to everyone discussing their family of origins and what many indicated was one of the most useful groups.  Ian Zavala says that he feels "hopeful" as the new medication seems to be helping. The therapist informs him that Ian Zavala is on the track to return to work and that this therapist will continue to recommend he be moved to a first shift if possible as this would be best for his long-term recovery.  In discussing  the [place] Melter, Ian Zavala says that he sees how he followed this pattern and that things took a turn for the worse when he entered his forties.   Progress Towards Goals: Ian Zavala reports no alcohol use.    UDS collected: No Results: Yes, negative for drugs and alcohol   AA/NA attended?: Yes   Sponsor?: Yes   Adam Phenix, [place], [place], [facility], [place] 05/19/2022

## 2022-05-22 ENCOUNTER — Ambulatory Visit (INDEPENDENT_AMBULATORY_CARE_PROVIDER_SITE_OTHER): Payer: 59 | Admitting: Licensed Clinical Social Worker

## 2022-05-22 DIAGNOSIS — F1321 Sedative, hypnotic or anxiolytic dependence, in remission: Secondary | ICD-10-CM

## 2022-05-22 DIAGNOSIS — F17200 Nicotine dependence, unspecified, uncomplicated: Secondary | ICD-10-CM

## 2022-05-22 DIAGNOSIS — F3181 Bipolar II disorder: Secondary | ICD-10-CM | POA: Diagnosis not present

## 2022-05-22 DIAGNOSIS — F1020 Alcohol dependence, uncomplicated: Secondary | ICD-10-CM

## 2022-05-22 NOTE — Progress Notes (Signed)
Daily Group Progress Note   Program: CD IOP     Individual Time: 9 a.m. to 12 p.m.   Type of Therapy: Process and Psychoeducational    Topic: The therapist checks in with group members, assesses for SI/HI/psychosis and overall level of functioning. The therapist inquires about sobriety date and number of community support meetings attended since last session.    The therapist explains the difference between open versus closed Twelve Step meetings and how it can be beneficial to have friends and loved ones attend open meetings and how they are to introduce themselves if they do. The therapist explains what a resentment chip is and its purpose. He explains what process addictions are and the reason these activities should be approached with caution by people in recovery for drugs and alcohol. He informs members about the website Meetup.com to find social groups.  The therapist covers the Matrix Model topic on the important of scheduling noting that group members can now use programs such as the recovery evaluator which one of the group members talks about today.    Summary: Ian Zavala presents rating his depression as a "2" and his anxiety as a "2."    He says that he had a good weekend and that his sobriety date is the same. He says that if he had planned better and gone to an earlier meeting then he would not have missed the 2nd half of a basketball game that he really wanted to watch.   He attended three meetings saying that he feels "hopeful" and "proud" and now has 109 days of sobriety. He saw his mother and says that it went well but that he can clearly see that she is an "ACOA" as she asks him to do things that she could easily do for herself.   Ian Zavala says that he put $10 or $15 into a [employer]' account and received a $200 addition as an incentive and bet winning over $100. He says that he does not see any harm in playing with their money and asks if he should be doing this with the therapist  cautioning him about the dangers of process addictions noting that [name] seems to have one in relation to compulsive shopping via [place].   The therapist informs Ian Zavala that the P.A. will extend his leave for another two weeks but he may be on track to return earlier than this as his mood remains good.   Progress Towards Goals: Ian Zavala reports no alcohol use.    UDS collected: Yes Results: No  AA/NA attended?: Yes   Sponsor?: Yes   Adam Phenix, [place], [place], [facility], [place] 05/22/2022

## 2022-05-24 ENCOUNTER — Ambulatory Visit (INDEPENDENT_AMBULATORY_CARE_PROVIDER_SITE_OTHER): Payer: 59 | Admitting: Licensed Clinical Social Worker

## 2022-05-24 DIAGNOSIS — F3181 Bipolar II disorder: Secondary | ICD-10-CM | POA: Diagnosis not present

## 2022-05-24 DIAGNOSIS — F17200 Nicotine dependence, unspecified, uncomplicated: Secondary | ICD-10-CM

## 2022-05-24 DIAGNOSIS — F1321 Sedative, hypnotic or anxiolytic dependence, in remission: Secondary | ICD-10-CM

## 2022-05-24 DIAGNOSIS — F1020 Alcohol dependence, uncomplicated: Secondary | ICD-10-CM

## 2022-05-24 NOTE — Progress Notes (Signed)
Daily Group Progress Note   Program: CD IOP     Individual Time: 9 a.m. to 12 p.m.   Type of Therapy: Process and Psychoeducational    Topic: The therapist checks in with group members, assesses for SI/HI/psychosis and overall level of functioning. The therapist inquires about sobriety date and number of community support meetings attended since last session.    The therapist facilitates a discussion on spirituality asking group members to consider how they will be content with what they have when reaching some goal in the future if not content with what they already have now. The therapist answers group member's questions about psychotropic medications and anti-craving medications.      Summary: Ian Zavala presents rating his depression as a "1" and his anxiety as a "2."    He says that he feels "grateful" and "optimistic." He says that his mood now is "night and day" from what it was before with Ian Zavala now on 50 mg of Lamictal q d.   He says that he has more excitement to do things and took his son fishing. In the past, he says that he would have been thinking of drinking or how much beer to put in the cooler but did not have these thoughts. He also notes that he was previously always getting irritated over small things but no more.  The therapist talks to Ian Zavala about his discharge plan and when he will be ready for discharge as his PHQ-9 and GAD-7 are now in the normal range. Rather than discharge this Friday, Ian Zavala notes that he is in "no hurry" as before wanting to stay in CD IOP a little longer to assure stability and have his Lamictal increased.   The therapist will consider 06/02/22 as a tentative date for [name]'s discharge based on his current level of progress.   Progress Towards Goals: Ian Zavala reports no alcohol use.    UDS collected: No Results: No  AA/NA attended?: Yes   Sponsor?: Yes   Adam Phenix, [place], [place], [facility], [place] 05/24/2022

## 2022-05-26 ENCOUNTER — Ambulatory Visit (INDEPENDENT_AMBULATORY_CARE_PROVIDER_SITE_OTHER): Payer: 59 | Admitting: Licensed Clinical Social Worker

## 2022-05-26 DIAGNOSIS — F17200 Nicotine dependence, unspecified, uncomplicated: Secondary | ICD-10-CM

## 2022-05-26 DIAGNOSIS — Z62811 Personal history of psychological abuse in childhood: Secondary | ICD-10-CM

## 2022-05-26 DIAGNOSIS — Z6372 Alcoholism and drug addiction in family: Secondary | ICD-10-CM

## 2022-05-26 DIAGNOSIS — F3181 Bipolar II disorder: Secondary | ICD-10-CM

## 2022-05-26 DIAGNOSIS — T50905A Adverse effect of unspecified drugs, medicaments and biological substances, initial encounter: Secondary | ICD-10-CM

## 2022-05-26 DIAGNOSIS — F5105 Insomnia due to other mental disorder: Secondary | ICD-10-CM

## 2022-05-26 DIAGNOSIS — F1321 Sedative, hypnotic or anxiolytic dependence, in remission: Secondary | ICD-10-CM

## 2022-05-26 DIAGNOSIS — F32A Depression, unspecified: Secondary | ICD-10-CM

## 2022-05-26 DIAGNOSIS — F989 Unspecified behavioral and emotional disorders with onset usually occurring in childhood and adolescence: Secondary | ICD-10-CM

## 2022-05-26 DIAGNOSIS — F341 Dysthymic disorder: Secondary | ICD-10-CM

## 2022-05-26 DIAGNOSIS — F419 Anxiety disorder, unspecified: Secondary | ICD-10-CM

## 2022-05-26 DIAGNOSIS — F99 Mental disorder, not otherwise specified: Secondary | ICD-10-CM

## 2022-05-26 DIAGNOSIS — E291 Testicular hypofunction: Secondary | ICD-10-CM

## 2022-05-26 DIAGNOSIS — F1020 Alcohol dependence, uncomplicated: Secondary | ICD-10-CM

## 2022-05-26 DIAGNOSIS — T7432XS Child psychological abuse, confirmed, sequela: Secondary | ICD-10-CM

## 2022-05-26 NOTE — Progress Notes (Signed)
Daily Group Progress Note   Program: CD IOP     Individual Time: 9 a.m. to 12 p.m.   Type of Therapy: Process and Psychoeducational    Topic: The therapist checks in with group members, assesses for SI/HI/psychosis and overall level of functioning. The therapist inquires about sobriety date and number of community support meetings attended since last session.   The therapist has group members complete and discuss Matrix Modules ERS1A through ERS3B. The therapist emphasizes the importance of talking to others when one is in "emotional relapse" and not just talking to someone when thinking about drinking or using.    Summary: Ian Zavala presents rating his depression as a "2" and his anxiety as a "2."    He notes having only one "no" on his external trigger checklist. He says that an emotion that he experienced that led to relapse which is not on the checklist is "disappointment."   Ian Zavala indicates that returning to work, getting bad news, and stress are all triggers that may be a problem in the near future. Ian Zavala indicates that he would never use at [facility] or kids events.    Progress Towards Goals: Ian Zavala reports no alcohol use.    UDS collected: No Results: Yes, negative for drugs and alcohol  AA/NA attended?: Yes   Sponsor?: Yes   Myrna Blazer, MA, LCSW, [facility], LCAS 05/26/2022

## 2022-05-29 ENCOUNTER — Ambulatory Visit (INDEPENDENT_AMBULATORY_CARE_PROVIDER_SITE_OTHER): Payer: 59 | Admitting: Licensed Clinical Social Worker

## 2022-05-29 ENCOUNTER — Other Ambulatory Visit: Payer: Self-pay | Admitting: Internal Medicine

## 2022-05-29 DIAGNOSIS — F3181 Bipolar II disorder: Secondary | ICD-10-CM | POA: Diagnosis not present

## 2022-05-29 DIAGNOSIS — F419 Anxiety disorder, unspecified: Secondary | ICD-10-CM

## 2022-05-29 DIAGNOSIS — F1321 Sedative, hypnotic or anxiolytic dependence, in remission: Secondary | ICD-10-CM

## 2022-05-29 DIAGNOSIS — F17200 Nicotine dependence, unspecified, uncomplicated: Secondary | ICD-10-CM

## 2022-05-29 DIAGNOSIS — F1020 Alcohol dependence, uncomplicated: Secondary | ICD-10-CM

## 2022-05-29 NOTE — Progress Notes (Signed)
Daily Group Progress Note   Program: CD IOP     Individual Time: 9 a.m. to 12 p.m.   Type of Therapy: Process and Psychoeducational    Topic: The therapist checks in with group members, assesses for SI/HI/psychosis and overall level of functioning. The therapist inquires about sobriety date and number of community support meetings attended since last session.   The therapist provides handouts to group members with a list of games from Transactional Analysis relating this to a discussion on how to deal with family members and loved ones who keep throwing up the past. The therapist discusses productive ways that family and loved ones can deal with their feelings of hurt and anger from the loved one's actions while using substances such as attending Alanon, seeing a therapist, attending family sessions via CD IOP, etcetera.    Summary: Ian Zavala presents rating his depression as a "2" and his anxiety as a "2."    He presents informing that therapist that he is getting a $35 bill from [facility] for this CD IOP visits with the therapist suggesting that it is possible he is being charged a co-pay per visit; however, noting that he could also have reached his out-of-pocket max from prior residential treatment admissions with the billing having not caught up to the present. He recommends Ian Zavala call his insurance company to get answers. He also talks to Ian Zavala about when he is ready to discharge with [name]'s tentative last day being on 06/02/22.  Ian Zavala reports that he is doing well and attending meetings. In talking about ways people can stay in denial about their use, Ian Zavala says that he latched on to a comment his father-in-law made once in saying that he could not see how a person could be an alcoholic if he only drank beer. Thus, Ian Zavala told himself that as along as he did not drink liquor that he was not an alcoholic.  Ian Zavala says that his wife will at times ask him to validate her feelings about what she went through due  to his drinking; however, she does not throw things up in his face. Ian Zavala notes that he can understand the importance of family members doing things like attending Alanon and getting help for themselves.   Progress Towards Goals: Ian Zavala reports no alcohol use.    UDS collected: Yes Results: No  AA/NA attended?: Yes   Sponsor?: Yes   Myrna Blazer, MA, LCSW, [facility], LCAS 05/29/2022
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
## 2022-05-31 ENCOUNTER — Other Ambulatory Visit (HOSPITAL_COMMUNITY): Payer: Self-pay | Admitting: Medical

## 2022-05-31 ENCOUNTER — Ambulatory Visit (INDEPENDENT_AMBULATORY_CARE_PROVIDER_SITE_OTHER): Payer: 59 | Admitting: Licensed Clinical Social Worker

## 2022-05-31 ENCOUNTER — Encounter (HOSPITAL_COMMUNITY): Payer: Self-pay | Admitting: Licensed Clinical Social Worker

## 2022-05-31 DIAGNOSIS — F5105 Insomnia due to other mental disorder: Secondary | ICD-10-CM

## 2022-05-31 DIAGNOSIS — F32A Depression, unspecified: Secondary | ICD-10-CM

## 2022-05-31 DIAGNOSIS — E291 Testicular hypofunction: Secondary | ICD-10-CM

## 2022-05-31 DIAGNOSIS — F1321 Sedative, hypnotic or anxiolytic dependence, in remission: Secondary | ICD-10-CM

## 2022-05-31 DIAGNOSIS — I10 Essential (primary) hypertension: Secondary | ICD-10-CM | POA: Insufficient documentation

## 2022-05-31 DIAGNOSIS — F3181 Bipolar II disorder: Secondary | ICD-10-CM

## 2022-05-31 DIAGNOSIS — F17200 Nicotine dependence, unspecified, uncomplicated: Secondary | ICD-10-CM

## 2022-05-31 DIAGNOSIS — F341 Dysthymic disorder: Secondary | ICD-10-CM

## 2022-05-31 DIAGNOSIS — Z6372 Alcoholism and drug addiction in family: Secondary | ICD-10-CM

## 2022-05-31 DIAGNOSIS — F419 Anxiety disorder, unspecified: Secondary | ICD-10-CM

## 2022-05-31 DIAGNOSIS — F1020 Alcohol dependence, uncomplicated: Secondary | ICD-10-CM

## 2022-05-31 DIAGNOSIS — T7432XS Child psychological abuse, confirmed, sequela: Secondary | ICD-10-CM
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
## 2022-05-31 NOTE — Addendum Note (Signed)
Addended by: Court Joy on: 05/31/2022 04:38 PM   Modules accepted: Level of Service

## 2022-05-31 NOTE — Progress Notes (Signed)
Pt seen regarding his Lamictal rx. He was given Rx last week to start 200mg  at conclusion of induction but didnt see pharmacy note of On hold for 200mg .Told Counselor he "needed Lamictal" He has rx.Reports marked improvement in mood "Like somebody turned on the lights"

## 2022-06-01 ENCOUNTER — Other Ambulatory Visit (HOSPITAL_COMMUNITY): Payer: Self-pay | Admitting: Medical

## 2022-06-01 NOTE — Telephone Encounter (Signed)
Pt has rx.

## 2022-06-02 ENCOUNTER — Ambulatory Visit (INDEPENDENT_AMBULATORY_CARE_PROVIDER_SITE_OTHER): Payer: 59 | Admitting: Licensed Clinical Social Worker

## 2022-06-02 DIAGNOSIS — F3181 Bipolar II disorder: Secondary | ICD-10-CM

## 2022-06-02 DIAGNOSIS — F1321 Sedative, hypnotic or anxiolytic dependence, in remission: Secondary | ICD-10-CM

## 2022-06-02 DIAGNOSIS — F1020 Alcohol dependence, uncomplicated: Secondary | ICD-10-CM

## 2022-06-02 DIAGNOSIS — F17200 Nicotine dependence, unspecified, uncomplicated: Secondary | ICD-10-CM

## 2022-06-02 NOTE — Progress Notes (Signed)
Daily Group Progress Note   Program: CD IOP     Individual Time: 9 a.m. to 12 p.m.   Type of Therapy: Process and Psychoeducational    Topic: The therapist checks in with group members, assesses for SI/HI/psychosis and overall level of functioning. The therapist inquires about sobriety date and number of community support meetings attended since last session.    The therapist introduces a new group member. The therapist presents information on and facilitates discussions concerning the following topics: how to cope with grief effectively and various types of bereavement support, what ASAM placement criteria are and how they are used to determine the appropriate level of care, what [facility] are and how to access them, what is meant by the term, "harm reduction," what is meant by the Twelve Step phrase about not letting a person "rent space" in one's head, what is meant by a "monitor" and a "blunter" and how they cope with stress differently, and the fact that a person in recovery will likely always have thoughts about using when under stress; however, the person does not have to act on these thoughts. The therapist again reviews the various anti-craving medications for alcohol and how they work and what they do.    Summary: Ian Zavala presents rating his depression as a "1" and his anxiety as a "2."    He says that he has four months sober which is about the same time that he relapsed the last time. He says that his mood is better than it was the last time and he notices that the stuff that used to trigger him before no longer does. He admits that his wife being gone used to be a big trigger to drink noting that it is not now as she is gone a lot working the [employer].  Ian Zavala says that he feels "grateful" and "proud." The therapist explains that stepping down from CD IOP can present an opportunity for Ian Zavala to become complacent; however, Ian Zavala indicates that he does not believe this will happen as  he has a good support system.  As is customary for him, Ian Zavala actively participates in all group discussions today.   Progress Towards Goals: Ian Zavala reports no alcohol use.    UDS collected: No Results: Yes, negative for drugs and alcohol  AA/NA attended?: Yes   Sponsor?: Yes   Myrna Blazer, MA, LCSW, [facility], LCAS 06/02/2022

## 2022-06-05 ENCOUNTER — Ambulatory Visit (INDEPENDENT_AMBULATORY_CARE_PROVIDER_SITE_OTHER): Payer: 59 | Admitting: Licensed Clinical Social Worker

## 2022-06-05 DIAGNOSIS — F3181 Bipolar II disorder: Secondary | ICD-10-CM | POA: Diagnosis not present

## 2022-06-05 DIAGNOSIS — F1321 Sedative, hypnotic or anxiolytic dependence, in remission: Secondary | ICD-10-CM

## 2022-06-05 DIAGNOSIS — F17200 Nicotine dependence, unspecified, uncomplicated: Secondary | ICD-10-CM

## 2022-06-05 DIAGNOSIS — F1020 Alcohol dependence, uncomplicated: Secondary | ICD-10-CM

## 2022-06-05 NOTE — Progress Notes (Signed)
Daily Group Progress Note   Program: [facility] IOP     Individual Time: 9 a.m. to 12 p.m.   Type of Therapy: Process and Psychoeducational    Topic: The therapist checks in with group members, assesses for SI/HI/psychosis and overall level of functioning. The therapist inquires about sobriety date and number of community support meetings attended since last session.    The therapist discusses relapses as being an opportunity for learning noting that relapses present the opportunity for people to identify triggers and then to question what they will do differently the next time instead of using when faced with this trigger. The therapist normalizes persons in early recovery being reluctant to reach other to other people and/or attend meetings as most will say they do not like people, trust people, or want to be around people. The therapist explains how adverse early childhood experiences and straining relationships with one's family of origin can lead to these sentiments. The therapist observes that many people in Twelve Step programs will says, "Hello, family" prior to speaking as people in the Twelve Step program become their new family. The therapist explains the reason that people are encouraged to attend meetings and work steps noting that not using drugs and alcohol is only the beginning step. The therapist explains that steps help people in recovery to make changes that prevent them from becoming "dry drunks" destined for relapse. The therapist illustrates when is meant by a "dry drunk." Lastly, the therapist explains the medical necessity criteria related to continued stay and/or discharge from CD IOP.     Summary: Ian Zavala presents rating his depression as a "1" and his anxiety as a "2."    He provides support to a group member who relapsed over the weekend discussing his previous admissions to inpatient treatment because of relapsing before eventually ending up in [facility] IOP. He says that he previously was  reluctant to attend meetings but he says that he now feels bad if he misses one. He says that this Sponsor has mellowed quite a bit and suspects that he was being harder on him in the beginning thinking Ian Zavala was more at risk of relapse.  In talking about adverse early childhood experiences that cause trust issues, the therapist notes [name]'s experiences with his mom growing up. Ian Zavala says that he is not so much ready to return to work as he is to start making Zavala again.   In discussing medical necessity criteria, the therapy notes that [name]'s biggest barrier previously was his chronic depression/anhedonia which has improved since the addition of Lamictal such that his prognosis for maintaining long-term sobriety has increased immensely.  Ian Zavala would like to continue in IOP until he is formally cleared to return to work with this therapist to fax the return to work form today after it is signed by the P.A.   Progress Towards Goals: Ian Zavala reports no alcohol use.    UDS collected: Yes Results: No  AA/NA attended?: Yes   Sponsor?: Yes   Myrna Blazer, MA, LCSW, [facility], LCAS 06/05/2022

## 2022-06-07 ENCOUNTER — Ambulatory Visit (INDEPENDENT_AMBULATORY_CARE_PROVIDER_SITE_OTHER): Payer: 59 | Admitting: Licensed Clinical Social Worker

## 2022-06-07 DIAGNOSIS — F1020 Alcohol dependence, uncomplicated: Secondary | ICD-10-CM

## 2022-06-07 DIAGNOSIS — F3181 Bipolar II disorder: Secondary | ICD-10-CM

## 2022-06-07 DIAGNOSIS — F17200 Nicotine dependence, unspecified, uncomplicated: Secondary | ICD-10-CM

## 2022-06-07 DIAGNOSIS — F1321 Sedative, hypnotic or anxiolytic dependence, in remission: Secondary | ICD-10-CM

## 2022-06-07 NOTE — Progress Notes (Signed)
Daily Group Progress Note   Program: [facility] IOP     Individual Time: 9 a.m. to 12 p.m.   Type of Therapy: Process and Psychoeducational    Topic: The therapist checks in with group members, assesses for SI/HI/psychosis and overall level of functioning. The therapist inquires about sobriety date and number of community support meetings attended since last session.   The therapist spends a great deal of time focusing on how to worry less about what others think about you and how to assert one's self with people who gossip and try to pass on things that others are supposedly saying about you. The therapist encourages people to avoid such individuals while explaining how to identify emotionally healthier people with whom they can cultivate relationships. The therapist talks about the importance of "connecting rituals" in dating and marital relationships. He explains how bibliotherapy can help individuals to start overcoming histories of trauma, grief, etcetera by exposing them to other people's stories much as the Big Book shares other alcoholics' stories. He encourages group members to branch out and trying different meetings explaining the benefits of doing so.        Summary: [name] presents rating his depression as a "1" and his anxiety as a "2."    He reports continuing to have the same sobriety date. He says that he feels "grateful" that he has been able to spend time with his kids. He is also grateful that he has been able to do things around the house to help while his wife works noting that he could not do that if he were drinking.  [name] is anxious about returned to work mainly due to worrying about what people will think about him. The therapist focuses a great deal of time on this topic as it is a concern for several group members with all admitting it has been a trigger to relapse. The therapist utilizes CBT challenging irrational beliefs underlying this fear and models how to set limit with  people assertively.  In talking about relationship problems with significant others, [name] says that when his wife enabled him when drinking that it did not bother him but gets on his nerves when sober.  He spoke with his EAP about his recovery plan which involves seeing the P.A. and this therapist on an outpatient basis after leaving [facility] IOP.   Progress Towards Goals: [name] reports no alcohol use.    UDS collected: No Results: No  AA/NA attended?: Yes   Sponsor?: Yes   Myrna Blazer, MA, LCSW, [facility], LCAS 06/07/2022

## 2022-06-09 ENCOUNTER — Ambulatory Visit (INDEPENDENT_AMBULATORY_CARE_PROVIDER_SITE_OTHER): Payer: 59 | Admitting: Licensed Clinical Social Worker

## 2022-06-09 DIAGNOSIS — F3181 Bipolar II disorder: Secondary | ICD-10-CM | POA: Diagnosis not present

## 2022-06-09 DIAGNOSIS — F1321 Sedative, hypnotic or anxiolytic dependence, in remission: Secondary | ICD-10-CM

## 2022-06-09 DIAGNOSIS — F17200 Nicotine dependence, unspecified, uncomplicated: Secondary | ICD-10-CM

## 2022-06-09 DIAGNOSIS — F1020 Alcohol dependence, uncomplicated: Secondary | ICD-10-CM

## 2022-06-09 NOTE — Progress Notes (Signed)
Daily Group Progress Note   Program: [facility] IOP     Individual Time: 9 a.m. to 12 p.m.   Type of Therapy: Process and Psychoeducational    Topic: The therapist checks in with group members, assesses for SI/HI/psychosis and overall level of functioning. The therapist inquires about sobriety date and number of community support meetings attended since last session.   The therapist facilitates a discussion on what people believe is the difference between a "problem drinker" and an "alcoholic." The therapist explains that these terms are not clinical terms educating them on the DSM-V and the criteria for diagnosing a Substance Use Disorder. The therapist illustrates how a Substance Use Disorder is a chronic medical condition. He answers group members' questions informing them that there is one ounce of ethyl alcohol in a standard drink such that twelve beers is as harmful as twelve shots of liquor. He explains that unlike opiates that alcohol causes tissue damage. He educates group members on the fact that some, but not all substances, have potentially fatal withdrawal syndromes and the reason that the preferred treatment for persons with opioid dependence is MAT rather than detox.    Summary: Ian Zavala presents rating his depression as a "1" and his anxiety as a "2."    Ian Zavala reports having the same sobriety date and attends daily meetings. He says that he is "optimistic." When he started IOP, he says that he was having highs and lows and that his depression and anxiety scores were 7s but are now 1s and 2s.  Ian Zavala has his return-to-work physical next Wednesday. He is active in today's group discussion.    Progress Towards Goals: Ian Zavala reports no alcohol use.    UDS collected: No Results: Yes, negative for drugs and alcohol  AA/NA attended?: Yes   Sponsor?: Yes   Myrna Blazer, MA, LCSW, [facility], LCAS 06/09/2022

## 2022-06-12 ENCOUNTER — Ambulatory Visit (INDEPENDENT_AMBULATORY_CARE_PROVIDER_SITE_OTHER): Payer: 59 | Admitting: Medical

## 2022-06-12 ENCOUNTER — Other Ambulatory Visit (HOSPITAL_COMMUNITY): Payer: Self-pay | Admitting: Medical

## 2022-06-12 ENCOUNTER — Other Ambulatory Visit: Payer: Self-pay | Admitting: Internal Medicine

## 2022-06-12 DIAGNOSIS — F99 Mental disorder, not otherwise specified: Secondary | ICD-10-CM

## 2022-06-12 DIAGNOSIS — Z6372 Alcoholism and drug addiction in family: Secondary | ICD-10-CM

## 2022-06-12 DIAGNOSIS — T7432XS Child psychological abuse, confirmed, sequela: Secondary | ICD-10-CM

## 2022-06-12 DIAGNOSIS — F1090 Alcohol use, unspecified, uncomplicated: Secondary | ICD-10-CM

## 2022-06-12 DIAGNOSIS — G4701 Insomnia due to medical condition: Secondary | ICD-10-CM

## 2022-06-12 DIAGNOSIS — F1321 Sedative, hypnotic or anxiolytic dependence, in remission: Secondary | ICD-10-CM

## 2022-06-12 DIAGNOSIS — T50905A Adverse effect of unspecified drugs, medicaments and biological substances, initial encounter: Secondary | ICD-10-CM

## 2022-06-12 DIAGNOSIS — F1021 Alcohol dependence, in remission: Secondary | ICD-10-CM

## 2022-06-12 DIAGNOSIS — F3181 Bipolar II disorder: Secondary | ICD-10-CM

## 2022-06-12 DIAGNOSIS — F17200 Nicotine dependence, unspecified, uncomplicated: Secondary | ICD-10-CM

## 2022-06-12 DIAGNOSIS — F419 Anxiety disorder, unspecified: Secondary | ICD-10-CM | POA: Diagnosis not present

## 2022-06-12 DIAGNOSIS — F5105 Insomnia due to other mental disorder: Secondary | ICD-10-CM

## 2022-06-12 DIAGNOSIS — E291 Testicular hypofunction: Secondary | ICD-10-CM

## 2022-06-12 DIAGNOSIS — F341 Dysthymic disorder: Secondary | ICD-10-CM

## 2022-06-12 MED ORDER — PROPRANOLOL HCL 10 MG PO TABS
ORAL_TABLET | ORAL | 3 refills | Status: DC
Start: 2022-06-12 — End: 2023-06-12

## 2022-06-12 MED ORDER — LAMOTRIGINE 200 MG PO TABS: 200.0000 mg | ORAL_TABLET | Freq: Every day | ORAL | 2 refills | Status: DC

## 2022-06-12 MED ORDER — GABAPENTIN 300 MG PO CAPS: 300.0000 mg | ORAL_CAPSULE | Freq: Three times a day (TID) | ORAL | 0 refills | Status: DC

## 2022-06-12 MED ORDER — NALTREXONE HCL 50 MG PO TABS
ORAL_TABLET | ORAL | 1 refills | Status: DC
Start: 2022-06-12 — End: 2023-01-17

## 2022-06-12 MED ORDER — SERTRALINE HCL 100 MG PO TABS: 200.0000 mg | ORAL_TABLET | Freq: Every day | ORAL | 1 refills | Status: DC

## 2022-06-12 MED ORDER — BUSPIRONE HCL 10 MG PO TABS: ORAL_TABLET | ORAL | 1 refills | Status: DC

## 2022-06-12 MED ORDER — TRAZODONE HCL 150 MG PO TABS
ORAL_TABLET | ORAL | 1 refills | Status: DC
Start: 2022-06-12 — End: 2022-12-17
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
## 2022-06-12 NOTE — Progress Notes (Signed)
Daily Group Progress Note   Program: CD IOP     Individual Time: 9 a.m. to 12 p.m.   Type of Therapy: Process and Psychoeducational    Topic: The therapist checks in with group members, assesses for SI/HI/psychosis and overall level of functioning. The therapist inquires about sobriety date and number of community support meetings attended since last session.   The therapist focuses primarily on the concept of addiction as a biological disease with inheritable components versus addiction being a result of external factors outside oneself.    Summary: Ian Zavala presents rating his depression as a "2" and his anxiety as a "2."    Ian Zavala reports having the same sobriety date and continues to attend a meeting per day. He says that he feels "successful" and "ready-to-go." He admits that he feels "kind of sad" about graduating and no longer seeing the people in group.   Ian Zavala talks about realizing that if he engages in people pleasing which he tends to do that it can cause him to harbor resentments. He says that he now realizes that resentments can be very subtle versus extreme and obvious.  Ian Zavala continues to talk about thoughts that pop in his head regarding the possibility of drinking again which the therapist notes is his disease talking to him. The therapist observes that when Ian Zavala knows down to the core of his being that he can never drink without it bringing him right back to where it has always led him that the disease will lose its power.   Progress Towards Goals: Ian Zavala reports no alcohol use.    UDS collected: Yes Results: No  AA/NA attended?: Yes   Sponsor?: Yes   Myrna Blazer, MA, LCSW, [facility], LCAS 06/12/2022

## 2022-06-14 ENCOUNTER — Ambulatory Visit (HOSPITAL_COMMUNITY): Payer: 59

## 2022-06-14 ENCOUNTER — Encounter (HOSPITAL_COMMUNITY): Payer: Self-pay

## 2022-06-14 ENCOUNTER — Ambulatory Visit: Payer: 59 | Admitting: Nurse Practitioner

## 2022-06-15 NOTE — Telephone Encounter (Signed)
Seen in Clinic

## 2022-06-16 ENCOUNTER — Ambulatory Visit (HOSPITAL_COMMUNITY): Payer: 59

## 2022-06-16 ENCOUNTER — Ambulatory Visit (INDEPENDENT_AMBULATORY_CARE_PROVIDER_SITE_OTHER): Payer: 59 | Admitting: Licensed Clinical Social Worker

## 2022-06-16 DIAGNOSIS — F1090 Alcohol use, unspecified, uncomplicated: Secondary | ICD-10-CM | POA: Diagnosis not present

## 2022-06-16 DIAGNOSIS — F3181 Bipolar II disorder: Secondary | ICD-10-CM

## 2022-06-16 DIAGNOSIS — F1321 Sedative, hypnotic or anxiolytic dependence, in remission: Secondary | ICD-10-CM

## 2022-06-18 NOTE — Progress Notes (Signed)
Daily Group Progress Note   Program: CD IOP     Individual Time: 9 a.m. to 12 p.m.   Type of Therapy: Process and Psychoeducational    Topic: The therapist checks in with group members, assesses for SI/HI/psychosis and overall level of functioning. The therapist inquires about sobriety date and number of community support meetings attended since last session.   The therapist informs group members not present at last group of another group member's passing. The therapist has group members share their stories of how they came to be in CD IOP for a new group member starting today. The therapist primarily focuses on working to get group members to understand the things that their disease will tell them to justify using and to get them to ignore all of the data pointing to the fact that using will definitely cause them to continue to have serious problems resulting in "jails, institutions, or death." The therapist continues to emphasize the importance of avoiding triggers and recommends having non-using support remove old stashes which represent the "things" in "people, places and things."    Summary: Ian Zavala presents rating his depression as a "2" and his anxiety as a "2."    Ian Zavala says that he feels "grief" and "surprised" in relation to hearing of the group member's passing. Ian Zavala shares his history with the new group member noting that he previously had the thought that he could "maybe one day" he could "drink again;" however, he says that he knows that this will never be the case.  He says that he was recently feeling "really heavy" and "anxious" and "depressed" and had thoughts about drinking "to numb;" however, he says that one of his Counselors from an inpatient facility where he was previously called out-of-the-blue to check on him which helped him to feel better.  Ian Zavala passed his work physical and is now awaiting a start date for returning to work   Progress Towards Goals: Ian Zavala reports no  alcohol use.    UDS collected: No Results: Yes, negative for drugs and alcohol  AA/NA attended?: Yes   Sponsor?: Yes   Myrna Blazer, MA, LCSW, [facility], LCAS 06/16/2022

## 2022-06-19 ENCOUNTER — Ambulatory Visit (INDEPENDENT_AMBULATORY_CARE_PROVIDER_SITE_OTHER): Payer: 59 | Admitting: Licensed Clinical Social Worker

## 2022-06-19 DIAGNOSIS — F3181 Bipolar II disorder: Secondary | ICD-10-CM

## 2022-06-19 DIAGNOSIS — F1090 Alcohol use, unspecified, uncomplicated: Secondary | ICD-10-CM | POA: Diagnosis not present

## 2022-06-19 DIAGNOSIS — F1321 Sedative, hypnotic or anxiolytic dependence, in remission: Secondary | ICD-10-CM

## 2022-06-19 DIAGNOSIS — F1021 Alcohol dependence, in remission: Secondary | ICD-10-CM

## 2022-06-19 NOTE — Progress Notes (Signed)
Daily Group Progress Note   Program: CD IOP     Individual Time: 9 a.m. to 12 p.m.   Type of Therapy: Process and Psychoeducational    Topic: The therapist checks in with group members, assesses for SI/HI/psychosis and overall level of functioning. The therapist inquires about sobriety date and number of community support meetings attended since last session.   The therapist discusses what is meant by being on the "pink cloud" and explains what PAWS is and the reason that people will almost certainly fall off the pink cloud while working to get their first year of sobriety. The therapist explains what is meant by catastropizing and how to stop this via thought stopping techniques and using more rational self-talk. The therapist allows a group member to introduce group members to a free "12 Step" app.   The therapist explains that if one wishes to determine if he or she is an alcoholic or an addict, then the main criteria on which to focus is on loss of control. The therapist notes that non-alcoholic or social drinkers never lose control and puzzle at the reason that alcoholics "just can't have one drink" while alcoholic puzzle at how social drinkers can stop at one drink.    Summary: Ian Zavala presents rating his depression as a "3" and his anxiety as a "3."    He says that the weekend was "not too bad" but that he feels "worried" and "unfocused" mainly due to worries about his finances. He admits that his wife is not aware of how much credit card debt he has accrued. He estimates that this past year in treatment has cost him about 40K. He says that he is "bummed out" where he "put" himself.   The therapist talks about how much people spend when using with Ian Zavala noting that people never complain about spending this Zavala. The therapist also notes how much Zavala Ian Zavala was losing not working due to his alcoholism and points out that if a person dies from alcoholism that the ability to earn anymore Zavala  ends completely. Ian Zavala then recognizes that he was spending about $600 a month at least on alcohol which he will now be able to save and that he needs to be grateful that he has insurance that paid so much of his inpatient treatment episodes.   He says that he has a need to get back to work and earn Zavala at which time he gets a call saying that he can come back. Thus, he says that he now feels "excited" and a "little nervous." He says that he spoke up at two meetings and rather than being upset that his Sponsor told him that [name]'s was one of the best shares that he has heard at a meeting. Ian Zavala attributes this to the depth of conversation that occurs in CD IOP.   He schedules to see this therapist for outpatient therapy in a week and thanks everyone in group. He says that he will likely get ADA paperwork to see about getting on first shift.   Progress Towards Goals: Ian Zavala reports no alcohol use.    UDS collected: Yes Results: No  AA/NA attended?: Yes   Sponsor?: Yes   Myrna Blazer, MA, LCSW, [facility], LCAS 06/19/2022

## 2022-06-21 ENCOUNTER — Encounter (HOSPITAL_COMMUNITY): Payer: Self-pay | Admitting: Medical

## 2022-06-21 DIAGNOSIS — F419 Anxiety disorder, unspecified: Secondary | ICD-10-CM | POA: Insufficient documentation

## 2022-06-22 ENCOUNTER — Telehealth (HOSPITAL_COMMUNITY): Payer: Self-pay | Admitting: Licensed Clinical Social Worker

## 2022-06-22 NOTE — Telephone Encounter (Signed)
Therapist faxes [name]'s completed Supplemental Doctor's Statement to his EAP.  Myrna Blazer, MA, LCSW, [facility], LCAS 06/22/2022

## 2022-06-25 ENCOUNTER — Other Ambulatory Visit: Payer: Self-pay | Admitting: Internal Medicine

## 2022-06-25 DIAGNOSIS — F1021 Alcohol dependence, in remission: Secondary | ICD-10-CM

## 2022-06-26 ENCOUNTER — Telehealth (HOSPITAL_COMMUNITY): Payer: Self-pay | Admitting: Licensed Clinical Social Worker

## 2022-06-26 NOTE — Telephone Encounter (Signed)
The therapist receives a call from Ian Zavala confirming his identity via two identifiers. [name] is calling about the last medical form which was completed by the PA-C with the therapist informing him that it was faxed to his EAP Representative. He says that he contacted her and is awaiting a call.  He is trying to figure out the co-pays that he was charged for CD IOP saying that his insurance tried calling Dr. Lucianne Muss but got no response. The therapist reminds Ian Zavala that he was supposed to give them this therapist's name and direct contact number which he says that he now remembers and will do.  [address], MA, LCSW, [facility], LCAS 06/26/2022

## 2022-06-29 ENCOUNTER — Ambulatory Visit (HOSPITAL_COMMUNITY): Payer: 59 | Admitting: Licensed Clinical Social Worker

## 2022-07-03 ENCOUNTER — Encounter: Payer: Self-pay | Admitting: Internal Medicine

## 2022-07-03 ENCOUNTER — Ambulatory Visit (INDEPENDENT_AMBULATORY_CARE_PROVIDER_SITE_OTHER): Payer: 59 | Admitting: Internal Medicine

## 2022-07-03 VITALS — BP 140/70 | HR 71 | Temp 97.9°F | Resp 16 | Ht 76.0 in | Wt 254.6 lb

## 2022-07-03 DIAGNOSIS — Z79899 Other long term (current) drug therapy: Secondary | ICD-10-CM

## 2022-07-03 DIAGNOSIS — E782 Mixed hyperlipidemia: Secondary | ICD-10-CM

## 2022-07-03 DIAGNOSIS — I10 Essential (primary) hypertension: Secondary | ICD-10-CM

## 2022-07-03 DIAGNOSIS — F418 Other specified anxiety disorders: Secondary | ICD-10-CM

## 2022-07-03 DIAGNOSIS — E559 Vitamin D deficiency, unspecified: Secondary | ICD-10-CM

## 2022-07-03 DIAGNOSIS — R7309 Other abnormal glucose: Secondary | ICD-10-CM

## 2022-07-03 DIAGNOSIS — N529 Male erectile dysfunction, unspecified: Secondary | ICD-10-CM

## 2022-07-03 DIAGNOSIS — F1090 Alcohol use, unspecified, uncomplicated: Secondary | ICD-10-CM

## 2022-07-03 LAB — CBC WITH DIFFERENTIAL/PLATELET
Basophils Relative: 0.6 %
Eosinophils Relative: 1.7 %
HCT: 42.9 % (ref 38.5–50.0)
MCHC: 34.7 g/dL (ref 32.0–36.0)
MCV: 90.1 fL (ref 80.0–100.0)
Neutro Abs: 3093 cells/uL (ref 1500–7800)
RBC: 4.76 10*6/uL (ref 4.20–5.80)

## 2022-07-03 NOTE — Progress Notes (Signed)
Future Appointments  Date Time Provider Department  07/03/2022  4:00 PM Lucky Cowboy, MD [facility]  01/01/2023  3:00 PM Lucky Cowboy, MD [facility]    History of Present Illness:       This very nice 47 y.o. MWM presents for 6 month follow up with HTN, HLD, Pre-Diabetes and Vitamin D Deficiency.  Patient has had 2 admissions for Alcohol rehabilitation .  Patient is followed at [facility] for Alcohol Use Disorder, Dysthymia & Chronic Anxiety by Maryjean Morn, PA-C.        Patient is treated for HTN since age in 54  & BP has been controlled at home. Today's BP is at goal - 126/86. Patient has had no complaints of any cardiac type chest pain, palpitations, dyspnea / orthopnea / PND, dizziness, claudication, or dependent edema.       Hyperlipidemia is controlled with diet & Rosuvastatin 20 mg 3 x /week. Patient report flu-like myalgias  For 24-36 hrs after taking Rosuvastatin  or other med SE's. Last Lipids were not at goal:   Lab Results  Component Value Date   CHOL 191 05/10/2022   HDL 43 05/10/2022   LDLCALC 111 (H) 05/10/2022   TRIG 241 (H) 05/10/2022   CHOLHDL 4.4 05/10/2022    Also, the patient has history of PreDiabetes (A1c 5.8%/2015) and has had no symptoms of reactive hypoglycemia, diabetic polys, paresthesias or visual blurring.  Last A1c was normal & at goal:  Lab Results  Component Value Date   HGBA1C 5.7 (H) 04/03/2022         Patient has hx/o Testosterone deficiency ("245" in 2012 ) and has been on Testosterone Replacement since .         Further, the patient also has history of Vitamin D Deficiency ("25" /2008) and supplements vitamin D without any suspected side-effects. Last vitamin D was low (goal 70-100):  Lab Results  Component Value Date   VD25OH 59 04/03/2022       Current Outpatient Medications  Medication Instructions   bisoprolol  10 MG tablet TAKE 1 TABLET DAILY    busPIRone  10 MG tablet Take 1 tablet 2 to 3 x /day  for Anxiety   VITAMIN D  5000 IU  Take daily.    enalapril  20 MG tablet TAKE 2 TABLETS DAILY    Fish Oil  2,000 mg Daily   gabapentin 300 mg 3 times daily   Hctz 25 MG tablet TAKE 1 TABLET EVERY MORNING   hydrOXYzine  25 MG tablet TAKE 1 TABLET 2-3 TIMES A  DAY AS NEEDED    lamoTRIgine (LAMICTAL)  200 mg Daily   Magnesium 400 MG CAPS 1 capsule Daily   naltrexone (DEPADE) 50 MG tablet Take  1 tablet  Daily  to Suppress Alcohol Cravings   omeprazole  40 MG capsule TAKE 1 CAPSULE DAILY TO    PREVENT INDIGESTION AND    HEARTBURN   propranolol 10 MG tablet TAKE 1 TABLET 3-4 TIMES A  DAY AS NEEDED FOR ACUTE    ANXIETY OR PANIC ATTACK   Rosuvastatin  20 MG tablet TAKE 1 TABLET 3 DAYS PER   WEEK FOR CHOLESTEROL   sertraline (ZOLOFT)  200 mg TAKE 1/2 TO 1 TABLET DAILY   DEPO-TESTOSTERONE CYPIO 200 MG/ML inject Inject 1 ml into Muscle every 7 days   traZODone 150 MG tablet Take  1/2 to 1 tablet  1 to 2 hours  before Bedtime  as needed  Allergies  Allergen Reactions   Citalopram Other (See Comments)    anxiety   Pravastatin    Trazodone And Nefazodone    Wellbutrin [Bupropion]    Serotonin Reuptake Inhibitors (SSRI's) Anxiety    Serotonin syndrome with trintellix and trazodone- avoid combinations that can increase risk    PMHx:   Past Medical History:  Diagnosis Date   Anxiety    Depression    GERD (gastroesophageal reflux disease)    Hyperlipidemia    Hypertension    Hypogonadism male    Obesity    OSA on CPAP 2019   Vitamin D deficiency     Immunization History  Administered Date(s) Administered   HPV 9-valent 06/27/2017   Influenza Inj Mdck Quad With Preservative 11/08/2016   Influenza Whole 12/19/2011   Influenza, Seasonal, Injecte, Preservative Fre 02/02/2015   PPD Test 01/09/2013, 04/06/2015   Td 02/21/2004   Tdap 04/06/2015    No past surgical history on file.  FHx:    Reviewed / unchanged  SHx:    Reviewed / unchanged   Systems Review:  Constitutional:  Denies fever, chills, wt changes, headaches, insomnia, fatigue, night sweats, change in appetite. Eyes: Denies redness, blurred vision, diplopia, discharge, itchy, watery eyes.  ENT: Denies discharge, congestion, post nasal drip, epistaxis, sore throat, earache, hearing loss, dental pain, tinnitus, vertigo, sinus pain, snoring.  CV: Denies chest pain, palpitations, irregular heartbeat, syncope, dyspnea, diaphoresis, orthopnea, PND, claudication or edema. Respiratory: denies cough, dyspnea, DOE, pleurisy, hoarseness, laryngitis, wheezing.  Gastrointestinal: Denies dysphagia, odynophagia, heartburn, reflux, water brash, abdominal pain or cramps, nausea, vomiting, bloating, diarrhea, constipation, hematemesis, melena, hematochezia  or hemorrhoids. Genitourinary: Denies dysuria, frequency, urgency, nocturia, hesitancy, discharge, hematuria or flank pain. Musculoskeletal: Denies arthralgias, myalgias, stiffness, jt. swelling, pain, limping or strain/sprain.  Skin: Denies pruritus, rash, hives, warts, acne, eczema or change in skin lesion(s). Neuro: No weakness, tremor, incoordination, spasms, paresthesia or pain. Psychiatric: Denies confusion, memory loss or sensory loss. Endo: Denies change in weight, skin or hair change.  Heme/Lymph: No excessive bleeding, bruising or enlarged lymph nodes.  Physical Exam  BP (!) 140/70   Pulse 71   Temp 97.9 F (36.6 C)   Resp 16   Ht 6\' 4"  (1.93 m)   Wt 254 lb 9.6 oz (115.5 kg)   SpO2 98%   BMI 30.99 kg/m   Appears  well nourished, well groomed  and in no distress.  Eyes: PERRLA, EOMs, conjunctiva no swelling or erythema. Sinuses: No frontal/maxillary tenderness ENT/Mouth: EAC's clear, TM's nl w/o erythema, bulging. Nares clear w/o erythema, swelling, exudates. Oropharynx clear without erythema or exudates. Oral hygiene is good. Tongue normal, non obstructing. Hearing intact.  Neck: Supple. Thyroid not palpable. Car 2+/2+ without bruits, nodes or  JVD. Chest: Respirations nl with BS clear & equal w/o rales, rhonchi, wheezing or stridor.  Cor: Heart sounds normal w/ regular rate and rhythm without sig. murmurs, gallops, clicks or rubs. Peripheral pulses normal and equal  without edema.  Abdomen: Soft & bowel sounds normal. Non-tender w/o guarding, rebound, hernias, masses or organomegaly.  Lymphatics: Unremarkable.  Musculoskeletal: Full ROM all peripheral extremities, joint stability, 5/5 strength and normal gait.  Skin: Warm, dry without exposed rashes, lesions or ecchymosis apparent.  Neuro: Cranial nerves intact, reflexes equal bilaterally. Sensory-motor testing grossly intact. Tendon reflexes grossly intact.  Pysch: Alert & oriented x 3.  Insight and judgement nl & appropriate. No ideations.  Assessment and Plan:   1. Essential hypertension  - Continue medication, monitor  blood pressure at home.  - Continue DASH diet.  Reminder to go to the ER if any CP,  SOB, nausea, dizziness, severe HA, changes vision/speech.   - CBC with Differential/Platelet - COMPLETE METABOLIC PANEL WITH GFR - Magnesium - TSH  2. Hyperlipidemia, mixed  - Continue diet/meds, exercise,& lifestyle modifications.  - Continue monitor periodic cholesterol/liver & renal functions    - TSH  3. Abnormal glucose  - Continue diet, exercise  - Lifestyle modifications.  - Monitor appropriate labs   - Hemoglobin A1c - Insulin, random  4. Vitamin D deficiency  - Continue supplementation.   - VITAMIN D 25 Hydroxy  5. Testosterone Deficiency  - Testosterone  6. Medication management  - CBC with Differential/Platelet - COMPLETE METABOLIC PANEL WITH GFR - Magnesium - TSH - Hemoglobin A1c - Insulin, random - VITAMIN D 25 Hydroxy  - Testosterone - Lamotrigine level  7. Depression with anxiety  - TSH - Lamotrigine level  8. Alcohol use disorder        Discussed  regular exercise, BP monitoring, weight control to achieve/maintain BMI  less than 25 and discussed med and SE's. Recommended labs to assess and monitor clinical status with further disposition pending results of labs.  Will restart Zoloft and patient advised if develops side-effects to cut dose in 1/2 . I discussed the assessment and treatment plan with the patient. The patient was provided an opportunity to ask questions and all were answered. The patient agreed with the plan and demonstrated an understanding of the instructions.  I provided over 30 minutes of exam, counseling, chart review and  complex critical decision making.       The patient was advised to call back or seek an in-person evaluation if the symptoms worsen or if the condition fails to improve as anticipated.   Marinus Maw, MD

## 2022-07-03 NOTE — Patient Instructions (Signed)

## 2022-07-04 LAB — CBC WITH DIFFERENTIAL/PLATELET
Lymphs Abs: 1156 cells/uL (ref 850–3900)
MPV: 10.7 fL (ref 7.5–12.5)
Monocytes Relative: 7.3 %
Neutrophils Relative %: 65.8 %
Platelets: 226 10*3/uL (ref 140–400)
RDW: 12 % (ref 11.0–15.0)
WBC: 4.7 10*3/uL (ref 3.8–10.8)

## 2022-07-04 LAB — TSH: TSH: 2.08 mIU/L (ref 0.40–4.50)

## 2022-07-04 LAB — COMPLETE METABOLIC PANEL WITH GFR
Alkaline phosphatase (APISO): 64 U/L (ref 36–130)
CO2: 31 mmol/L (ref 20–32)
Chloride: 105 mmol/L (ref 98–110)
Total Bilirubin: 0.6 mg/dL (ref 0.2–1.2)
eGFR: 78 mL/min/{1.73_m2} (ref >=60)

## 2022-07-04 LAB — VITAMIN D 25 HYDROXY (VIT D DEFICIENCY, FRACTURES): Vit D, 25-Hydroxy: 60 ng/mL (ref 30–100)

## 2022-07-04 LAB — HEMOGLOBIN A1C: eAG (mmol/L): 6.2 mmol/L

## 2022-07-04 NOTE — Progress Notes (Signed)
^<^<^<^<^<^<^<^<^<^<^<^<^<^<^<^<^<^<^<^<^<^<^<^<^<^<^<^<^<^<^<^<^<^<^<^<^ ^>^>^>^>^>^>^>^>^>^>^>>^>^>^>^>^>^>^>^>^>^>^>^>^>^>^>^>^>^>^>^>^>^>^>^>^>  -   Testosterone level is  Normal &  OK  ^<^<^<^<^<^<^<^<^<^<^<^<^<^<^<^<^<^<^<^<^<^<^<^<^<^<^<^<^<^<^<^<^<^<^<^<^ ^>^>^>^>^>^>^>^>^>^>^>^>^>^>^>^>^>^>^>^>^>^>^>^>^>^>^>^>^>^>^>^>^>^>^>^>^  -  A1c is back in Normal non-Diabetic range ^>^>^>^>^>^>^>^>^>^>^>^>^>^>^>^>^>^>^>^>^>^>^>^>^>^>^>^>^>^>^>^>^>^>^>^>^  -Vitamin D = 60 -  Excellent   - Very low risk for Heart Attack  /  Stroke   -  Excellent - Keep all meds same  ^>^>^>^>^>^>^>^>^>^>^>^>^>^>^>^>^>^>^>^>^>^>^>^>^>^>^>^>^>^>^>^>^>^>^>^>

## 2022-07-05 LAB — CBC WITH DIFFERENTIAL/PLATELET
Absolute Monocytes: 343 cells/uL (ref 200–950)
Basophils Absolute: 28 cells/uL (ref 0–200)
Eosinophils Absolute: 80 cells/uL (ref 15–500)
Hemoglobin: 14.9 g/dL (ref 13.2–17.1)
MCH: 31.3 pg (ref 27.0–33.0)
Total Lymphocyte: 24.6 %

## 2022-07-05 LAB — HEMOGLOBIN A1C
Hgb A1c MFr Bld: 5.5 % of total Hgb (ref <5.7)
Mean Plasma Glucose: 111 mg/dL

## 2022-07-05 LAB — TESTOSTERONE: Testosterone: 471 ng/dL (ref 250–827)

## 2022-07-05 LAB — MAGNESIUM: Magnesium: 2.3 mg/dL (ref 1.5–2.5)

## 2022-07-05 LAB — COMPLETE METABOLIC PANEL WITH GFR
AG Ratio: 2.2 (calc) (ref 1.0–2.5)
ALT: 22 U/L (ref 9–46)
AST: 20 U/L (ref 10–40)
Albumin: 4.8 g/dL (ref 3.6–5.1)
BUN: 10 mg/dL (ref 7–25)
Calcium: 10 mg/dL (ref 8.6–10.3)
Creat: 1.17 mg/dL (ref 0.60–1.29)
Globulin: 2.2 g/dL (calc) (ref 1.9–3.7)
Glucose, Bld: 93 mg/dL (ref 65–99)
Potassium: 4.4 mmol/L (ref 3.5–5.3)
Sodium: 142 mmol/L (ref 135–146)
Total Protein: 7 g/dL (ref 6.1–8.1)

## 2022-07-05 LAB — INSULIN, RANDOM: Insulin: 17.3 u[IU]/mL

## 2022-07-05 LAB — LAMOTRIGINE LEVEL: Lamotrigine Lvl: 5.1 ug/mL (ref 2.5–15.0)

## 2022-07-05 NOTE — Progress Notes (Signed)
^<^<^<^<^<^<^<^<^<^<^<^<^<^<^<^<^<^<^<^<^<^<^<^<^<^<^<^<^<^<^<^<^<^<^<^<^ ^>^>^>^>^>^>^>^>^>^>^>>^>^>^>^>^>^>^>^>^>^>^>^>^>^>^>^>^>^>^>^>^>^>^>^>^>  -  Test results slightly outside the reference range are not unusual. If there is anything important, I will review this with you,  otherwise it is considered normal test values.  If you have further questions,  please do not hesitate to contact me at the office or via My Chart.   ^<^<^<^<^<^<^<^<^<^<^<^<^<^<^<^<^<^<^<^<^<^<^<^<^<^<^<^<^<^<^<^<^<^<^<^<^ ^>^>^>^>^>^>^>^>^>^>^>^>^>^>^>^>^>^>^>^>^>^>^>^>^>^>^>^>^>^>^>^>^>^>^>^>^  - Lamictal Level in low therapeutic range   - Will forward to Prescriber - Aletta Edouard, PA-C                                                 - to evaluate & adjust dose as he feels appropriate  ^<^<^<^<^<^<^<^<^<^<^<^<^<^<^<^<^<^<^<^<^<^<^<^<^<^<^<^<^<^<^<^<^<^<^<^<^ ^>^>^>^>^>^>^>^>^>^>^>^>^>^>^>^>^>^>^>^>^>^>^>^>^>^>^>^>^>^>^>^>^>^>^>^>^

## 2022-07-13 ENCOUNTER — Other Ambulatory Visit (HOSPITAL_COMMUNITY): Payer: Self-pay | Admitting: Medical

## 2022-07-13 NOTE — Telephone Encounter (Signed)
Rx at D/C frop CD IOP thru July

## 2022-07-18 ENCOUNTER — Encounter: Payer: Self-pay | Admitting: Internal Medicine

## 2022-07-18 ENCOUNTER — Other Ambulatory Visit: Payer: Self-pay | Admitting: Internal Medicine

## 2022-07-18 DIAGNOSIS — F418 Other specified anxiety disorders: Secondary | ICD-10-CM

## 2022-07-18 MED ORDER — SERTRALINE HCL 100 MG PO TABS: ORAL_TABLET | ORAL | 3 refills | Status: DC

## 2022-08-26 ENCOUNTER — Encounter: Payer: Self-pay | Admitting: Internal Medicine

## 2022-08-26 ENCOUNTER — Other Ambulatory Visit (HOSPITAL_COMMUNITY): Payer: Self-pay | Admitting: Medical

## 2022-09-12 IMAGING — US US ABDOMEN COMPLETE
1 series · 14 of 25 positions shown · non-contrast
Comparison: [facility]UB, 10/24/2015.

CLINICAL DATA: Recurrent upper abdominal pain. Elevated
transaminases.

EXAM:
ABDOMEN ULTRASOUND COMPLETE

[Series 1: us abdomen complete · 0.28mm/px · 14 of 75 slices shown]
[im 1/75]
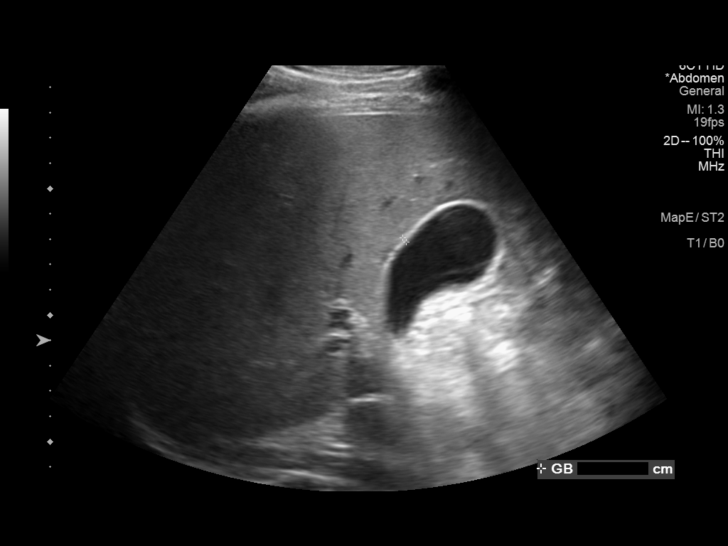
[im 7/75]
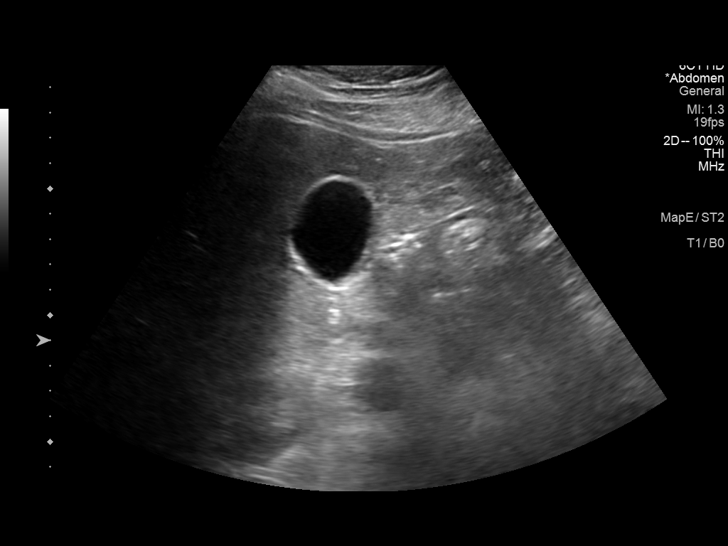
[im 13/75]
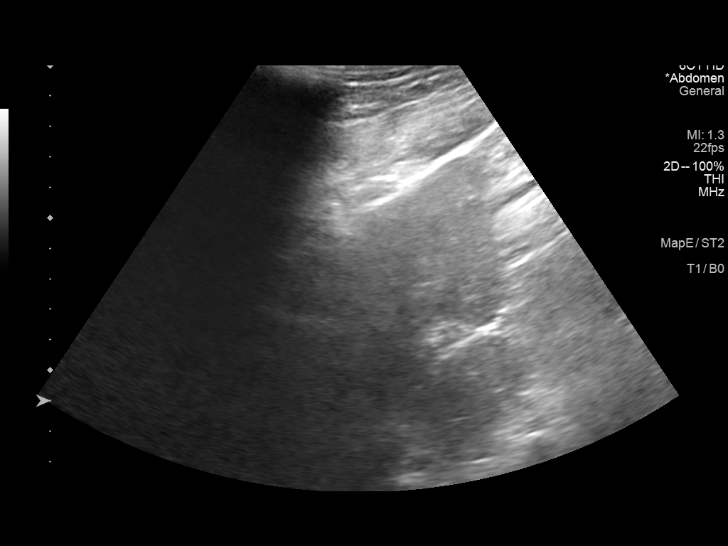
[im 19/75]
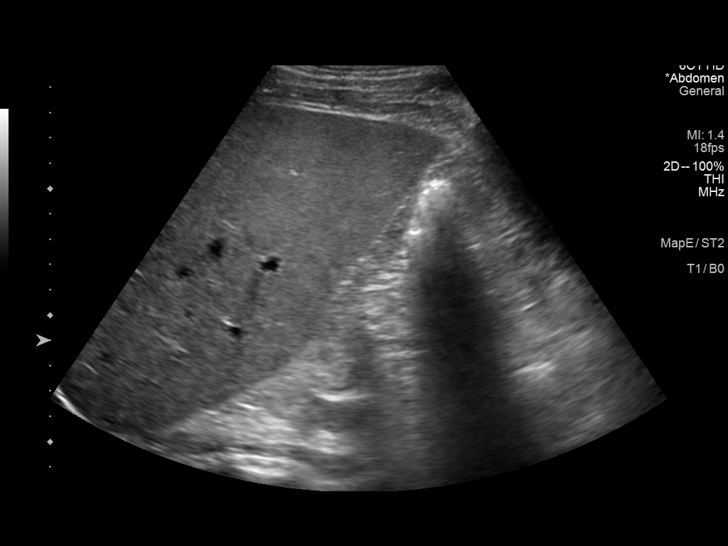
[im 25/75]
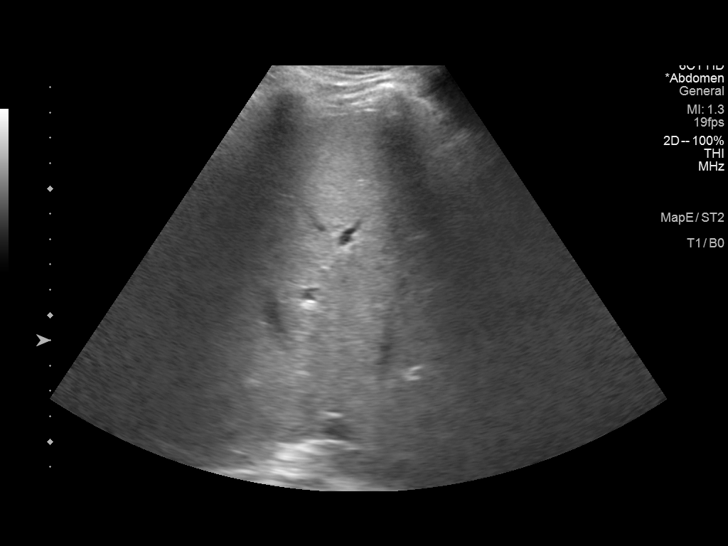
[im 28/75]
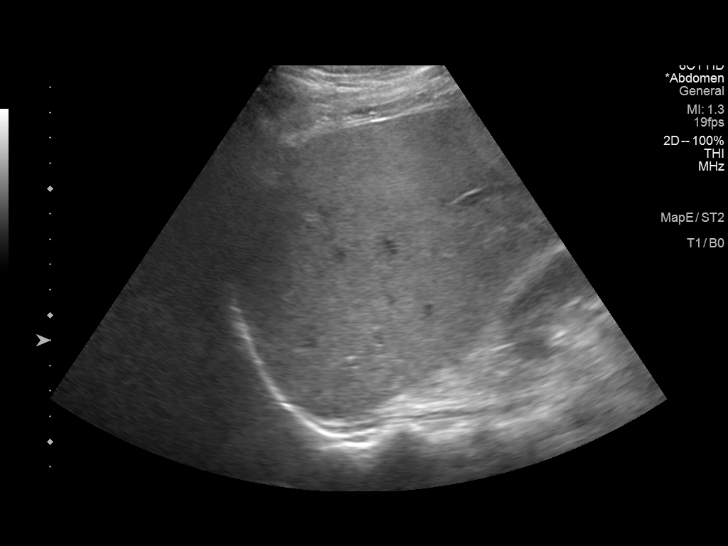
[im 34/75]
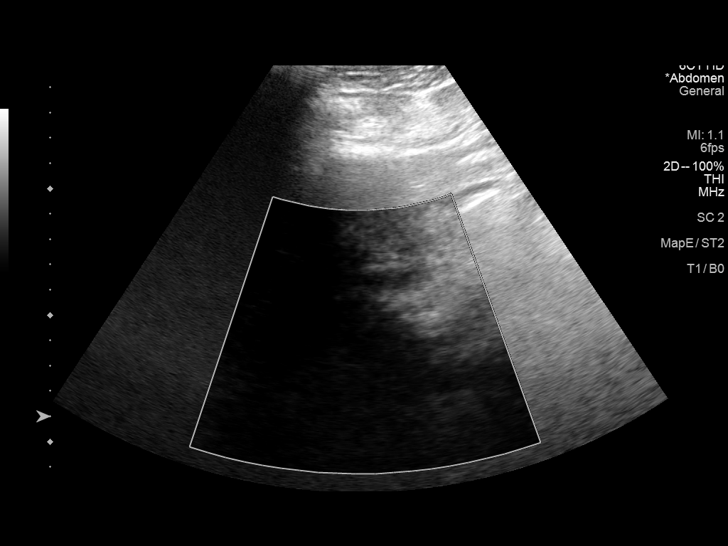
[im 41/75]
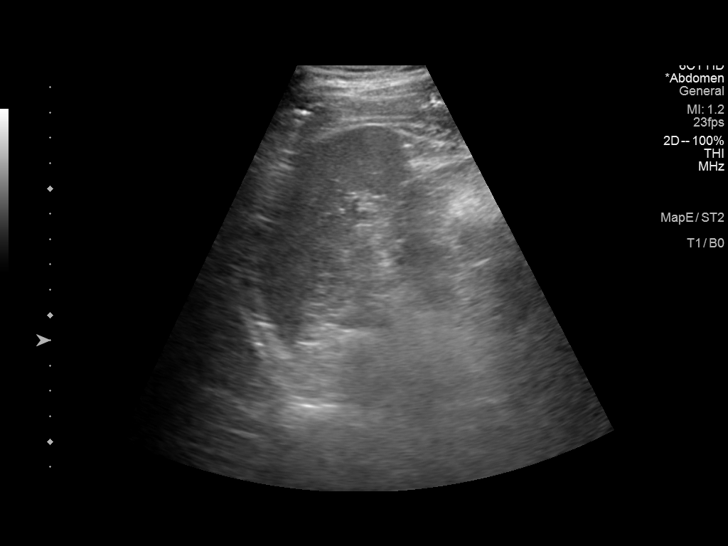
[im 47/75]
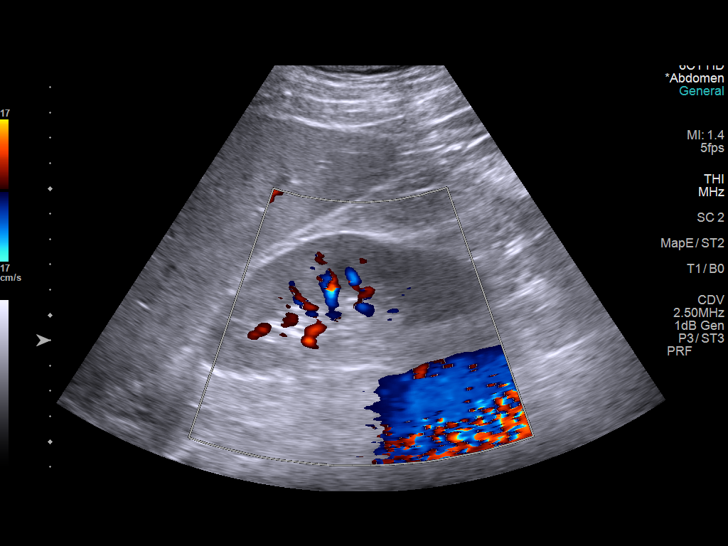
[im 50/75]
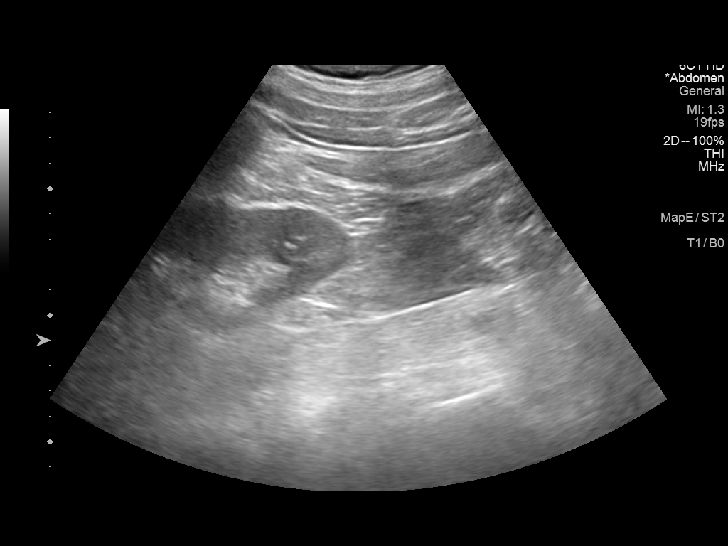
[im 56/75]
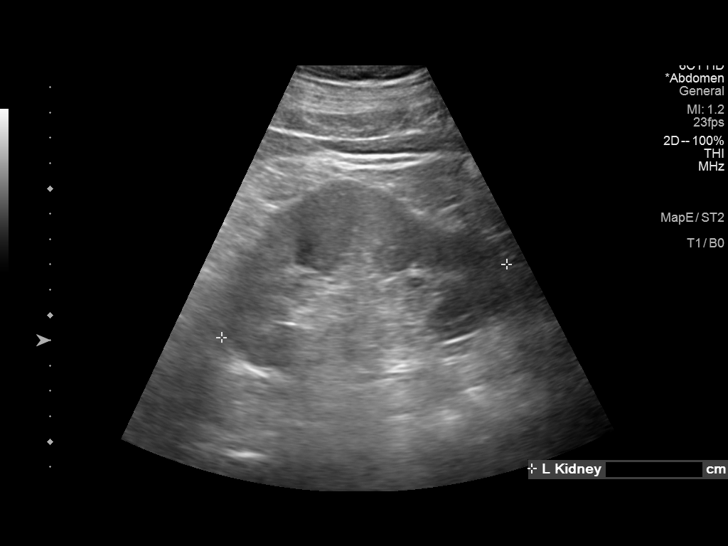
[im 62/75]
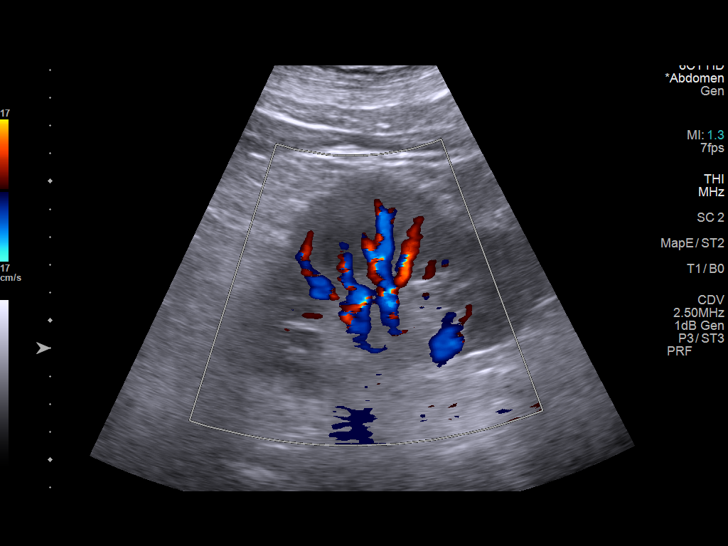
[im 68/75]
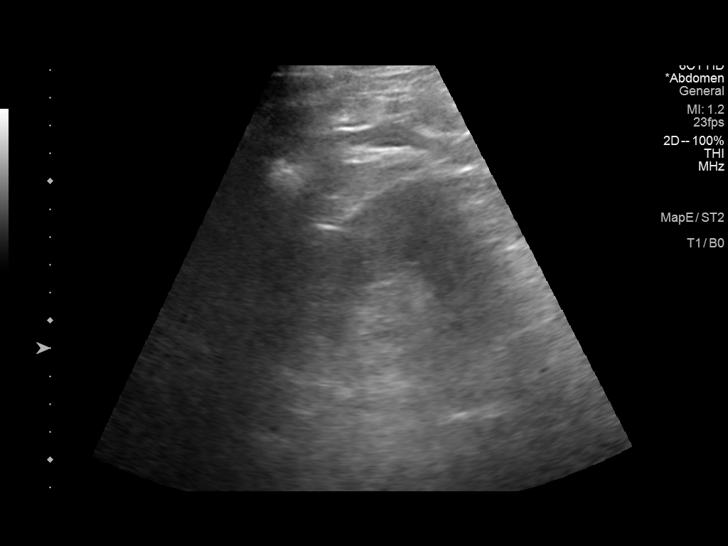
[im 75/75]
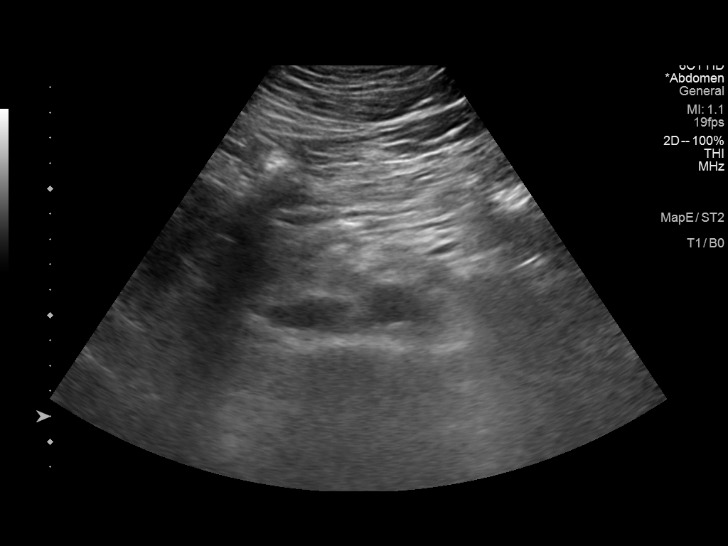

[14 of 25 positions shown; findings below may reference images not displayed]

FINDINGS: Suboptimal evaluation secondary to shadowing by overlying bowel gas.

Gallbladder: No gallstones or wall thickening visualized. No
sonographic Murphy sign noted by sonographer.

Common bile duct: Diameter: 0.5 cm

Liver: No focal lesion identified. Increased hepatic parenchymal
echogenicity. Portal vein is patent on color Doppler imaging with
normal direction of blood flow towards the liver.

IVC: Obscured.

Pancreas: Obscured

Spleen: Size and appearance within normal limits.

Right Kidney: Length: 12.1 cm. Echogenicity within normal limits. No
mass or hydronephrosis visualized.

Left Kidney: Length: 11.6 cm. Echogenicity within normal limits. No
mass or hydronephrosis visualized.

Abdominal aorta: Imaged portion is nonaneurysmal.

Other findings: No ascites.
IMPRESSION: Suboptimal evaluation, within these constraints;

1. No acute sonographic findings within the abdomen.
2. Echogenic liver. Findings commonly seen in hepatic steatosis, the
mouth or represent hepatitis and/or fibrosis.

## 2022-09-15 ENCOUNTER — Other Ambulatory Visit: Payer: Self-pay | Admitting: Internal Medicine

## 2022-09-15 DIAGNOSIS — E291 Testicular hypofunction: Secondary | ICD-10-CM

## 2022-09-15 MED ORDER — ROSUVASTATIN CALCIUM 20 MG PO TABS: ORAL_TABLET | ORAL | 3 refills | Status: DC

## 2022-09-15 MED ORDER — TESTOSTERONE CYPIONATE 200 MG/ML IM SOLN
INTRAMUSCULAR | 1 refills | Status: DC
Start: 2022-09-15 — End: 2023-03-19

## 2022-10-21 ENCOUNTER — Other Ambulatory Visit (HOSPITAL_COMMUNITY): Payer: Self-pay | Admitting: Medical

## 2022-11-13 ENCOUNTER — Other Ambulatory Visit (HOSPITAL_COMMUNITY): Payer: Self-pay | Admitting: Medical

## 2022-12-02 ENCOUNTER — Other Ambulatory Visit (HOSPITAL_COMMUNITY): Payer: Self-pay | Admitting: Medical

## 2022-12-04 ENCOUNTER — Other Ambulatory Visit: Payer: Self-pay | Admitting: Internal Medicine

## 2022-12-04 DIAGNOSIS — I10 Essential (primary) hypertension: Secondary | ICD-10-CM

## 2022-12-07 NOTE — Telephone Encounter (Signed)
Post CD IOP will need visit next refills

## 2022-12-09 ENCOUNTER — Other Ambulatory Visit (HOSPITAL_COMMUNITY): Payer: Self-pay | Admitting: Medical

## 2022-12-09 ENCOUNTER — Other Ambulatory Visit: Payer: Self-pay | Admitting: Nurse Practitioner

## 2022-12-09 DIAGNOSIS — G4701 Insomnia due to medical condition: Secondary | ICD-10-CM

## 2022-12-09 DIAGNOSIS — F419 Anxiety disorder, unspecified: Secondary | ICD-10-CM

## 2022-12-10 ENCOUNTER — Other Ambulatory Visit (HOSPITAL_COMMUNITY): Payer: Self-pay | Admitting: Medical

## 2022-12-17 ENCOUNTER — Encounter: Payer: Self-pay | Admitting: Internal Medicine

## 2022-12-17 ENCOUNTER — Other Ambulatory Visit: Payer: Self-pay | Admitting: Internal Medicine

## 2022-12-17 DIAGNOSIS — G4701 Insomnia due to medical condition: Secondary | ICD-10-CM

## 2022-12-17 DIAGNOSIS — F419 Anxiety disorder, unspecified: Secondary | ICD-10-CM

## 2022-12-17 MED ORDER — TRAZODONE HCL 150 MG PO TABS: ORAL_TABLET | ORAL | 1 refills | Status: DC

## 2022-12-17 MED ORDER — BUSPIRONE HCL 10 MG PO TABS
ORAL_TABLET | ORAL | 1 refills | Status: DC
Start: 2022-12-17 — End: 2023-03-20

## 2022-12-29 ENCOUNTER — Other Ambulatory Visit: Payer: Self-pay | Admitting: Internal Medicine

## 2022-12-29 DIAGNOSIS — I10 Essential (primary) hypertension: Secondary | ICD-10-CM

## 2022-12-31 ENCOUNTER — Encounter: Payer: Self-pay | Admitting: Internal Medicine

## 2022-12-31 NOTE — Patient Instructions (Signed)
Due to recent changes in healthcare laws, you may see the results of your imaging and laboratory studies on MyChart before your provider has had a chance to review them.  We understand that in some cases there may be results that are confusing or concerning to you. Not all laboratory results come back in the same time frame and the provider may be waiting for multiple results in order to interpret others.  Please give us 48 hours in order for your provider to thoroughly review all the results before contacting the office for clarification of your results.   +++++++++++++++++++++++++++++++++  Vit D  & Vit C 1,000 mg   are recommended to help protect  against the Covid-19 and other Corona viruses.    Also it's recommended  to take  Zinc 50 mg  to help  protect against the Covid-19   and best place to get  is also on [employer]  and don't pay more than 6-8 cents /pill !  ================================= Coronavirus (COVID-19) Are you at risk?  Are you at risk for the Coronavirus (COVID-19)?  To be considered HIGH RISK for Coronavirus (COVID-19), you have to meet the following criteria:  Traveled to [place], [place], [place], [place] or [place]; or in the [place] to [place], [place], [place]  or [place]; and have fever, cough, and shortness of breath within the last 2 weeks of travel OR Been in close contact with a person diagnosed with COVID-19 within the last 2 weeks and have  fever, cough,and shortness of breath  IF YOU DO NOT MEET THESE CRITERIA, YOU ARE CONSIDERED LOW RISK FOR COVID-19.  What to do if you are HIGH RISK for COVID-19?  If you are having a medical emergency, call 911. Seek medical care right away. Before you go to a doctor's office, urgent care or emergency department,  call ahead and tell them about your recent travel, contact with someone diagnosed with COVID-19   and your symptoms.  You should receive instructions from your physician's office  regarding next steps of care.  When you arrive at healthcare provider, tell the healthcare staff immediately you have returned from  visiting [place], [place], [place], [place] or [place]; or traveled in the [place] to [place], [place],  [place] or [place] in the last two weeks or you have been in close contact with a person diagnosed with  COVID-19 in the last 2 weeks.   Tell the health care staff about your symptoms: fever, cough and shortness of breath. After you have been seen by a medical provider, you will be either: Tested for (COVID-19) and discharged home on quarantine except to seek medical care if  symptoms worsen, and asked to  Stay home and avoid contact with others until you get your results (4-5 days)  Avoid travel on public transportation if possible (such as bus, train, or airplane) or Sent to the Emergency Department by EMS for evaluation, COVID-19 testing  and  possible admission depending on your condition and test results.  What to do if you are LOW RISK for COVID-19?  Reduce your risk of any infection by using the same precautions used for avoiding the common cold or flu:  Wash your hands often with soap and warm water for at least 20 seconds.  If soap and water are not readily available,  use an alcohol-based hand sanitizer with at least 60% alcohol.  If coughing or sneezing, cover your mouth and nose by coughing   or sneezing into the elbow areas of your shirt or coat,  into a tissue or into your sleeve (not your hands). Avoid shaking hands with others and consider head nods or verbal greetings only. Avoid touching your eyes, nose, or mouth with unwashed hands.  Avoid close contact with people who are sick. Avoid places or events with large numbers of people in one location, like concerts or sporting events. Carefully consider travel plans you have or are making. If you are planning any travel outside or inside the [place], visit the CDC's Travelers' Health  webpage for the latest health notices. If you have some symptoms but not all symptoms, continue to monitor at home and seek medical attention  if your symptoms worsen. If you are having a medical emergency, call 911. >>>>>>>>>>>>>>>>>>>>>>>> Preventive Care for Adults  A healthy lifestyle and preventive care can promote health and wellness. Preventive health guidelines for men include the following key practices: A routine yearly physical is a good way to check with your health care provider about your health and preventative screening. It is a chance to share any concerns and updates on your health and to receive a thorough exam. Visit your dentist for a routine exam and preventative care every 6 months. Brush your teeth twice a day and floss once a day. Good oral hygiene prevents tooth decay and gum disease. The frequency of eye exams is based on your age, health, family medical history, use of contact lenses, and other factors. Follow your health care provider's recommendations for frequency of eye exams. Eat a healthy diet. Foods such as vegetables, fruits, whole grains, low-fat dairy products, and lean protein foods contain the nutrients you need without too many calories. Decrease your intake of foods high in solid fats, added sugars, and salt. Eat the right amount of calories for you. Get information about a proper diet from your health care provider, if necessary. Regular physical exercise is one of the most important things you can do for your health. Most adults should get at least 150 minutes of moderate-intensity exercise (any activity that increases your heart rate and causes you to sweat) each week. In addition, most adults need muscle-strengthening exercises on 2 or more days a week. Maintain a healthy weight. The body mass index (BMI) is a screening tool to identify possible weight problems. It provides an estimate of body fat based on height and weight. Your health care provider can  find your BMI and can help you achieve or maintain a healthy weight. For adults 20 years and older: A BMI below 18.5 is considered underweight. A BMI of 18.5 to 24.9 is normal. A BMI of 25 to 29.9 is considered overweight. A BMI of 30 and above is considered obese. Maintain normal blood lipids and cholesterol levels by exercising and minimizing your intake of saturated fat. Eat a balanced diet with plenty of fruit and vegetables. Blood tests for lipids and cholesterol should begin at age 20 and be repeated every 5 years. If your lipid or cholesterol levels are high, you are over 50, or you are at high risk for heart disease, you may need your cholesterol levels checked more frequently. Ongoing high lipid and cholesterol levels should be treated with medicines if diet and exercise are not working. If you smoke, find out from your health care provider how to quit. If you do not use tobacco, do not start. Lung cancer screening is recommended for adults aged 55-80 years who are at high risk for   developing lung cancer because of a history of smoking. A yearly low-dose CT scan of the lungs is recommended for people who have at least a 30-pack-year history of smoking and are a current smoker or have quit within the past 15 years. A pack year of smoking is smoking an average of 1 pack of cigarettes a day for 1 year (for example: 1 pack a day for 30 years or 2 packs a day for 15 years). Yearly screening should continue until the smoker has stopped smoking for at least 15 years. Yearly screening should be stopped for people who develop a health problem that would prevent them from having lung cancer treatment. If you choose to drink alcohol, do not have more than 2 drinks per day. One drink is considered to be 12 ounces (355 mL) of beer, 5 ounces (148 mL) of wine, or 1.5 ounces (44 mL) of liquor. High blood pressure causes heart disease and increases the risk of stroke. Your blood pressure should be checked. Ongoing  high blood pressure should be treated with medicines, if weight loss and exercise are not effective. If you are 50-61 years old, ask your health care provider if you should take aspirin to prevent heart disease. Diabetes screening involves taking a blood sample to check your fasting blood sugar level. Testing should be considered at a younger age or be carried out more frequently if you are overweight and have at least 1 risk factor for diabetes. Colorectal cancer can be detected and often prevented. Most routine colorectal cancer screening begins at the age of 32 and continues through age 64. However, your health care provider may recommend screening at an earlier age if you have risk factors for colon cancer. On a yearly basis, your health care provider may provide home test kits to check for hidden blood in the stool. Use of a small camera at the end of a tube to directly examine the colon (sigmoidoscopy or colonoscopy) can detect the earliest forms of colorectal cancer. Talk to your health care provider about this at age 48, when routine screening begins. Direct exam of the colon should be repeated every 5-10 years through age 56, unless early forms of precancerous polyps or small growths are found. Screening for abdominal aortic aneurysm (AAA)  are recommended for persons over age 52 who have history of hypertensionor who are current or former smokers. Talk with your health care provider about prostate cancer screening. Testicular cancer screening is recommended for adult males. Screening includes self-exam, a health care provider exam, and other screening tests. Consult with your health care provider about any symptoms you have or any concerns you have about testicular cancer. Use sunscreen. Apply sunscreen liberally and repeatedly throughout the day. You should seek shade when your shadow is shorter than you. Protect yourself by wearing long sleeves, pants, a wide-brimmed hat, and sunglasses year  round, whenever you are outdoors. Once a month, do a whole-body skin exam, using a mirror to look at the skin on your back. Tell your health care provider about new moles, moles that have irregular borders, moles that are larger than a pencil eraser, or moles that have changed in shape or color. Stay current with required vaccines (immunizations). Influenza vaccine. All adults should be immunized every year. Tetanus, diphtheria, and acellular pertussis (Td, Tdap) vaccine. An adult who has not previously received Tdap or who does not know his vaccine status should receive 1 dose of Tdap. This initial dose should be followed by tetanus  and diphtheria toxoids (Td) booster doses every 10 years. Adults with an unknown or incomplete history of completing a 3-dose immunization series with Td-containing vaccines should begin or complete a primary immunization series including a Tdap dose. Adults should receive a Td booster every 10 years. Zoster vaccine. One dose is recommended for adults aged 84 years or older unless certain conditions are present.  Pneumococcal 13-valent conjugate (PCV13) vaccine. When indicated, a person who is uncertain of his immunization history and has no record of immunization should receive the PCV13 vaccine. An adult aged 18 years or older who has certain medical conditions and has not been previously immunized should receive 1 dose of PCV13 vaccine. This PCV13 should be followed with a dose of pneumococcal polysaccharide (PPSV23) vaccine. The PPSV23 vaccine dose should be obtained at least 8 weeks after the dose of PCV13 vaccine. An adult aged 36 years or older who has certain medical conditions and previously received 1 or more doses of PPSV23 vaccine should receive 1 dose of PCV13. The PCV13 vaccine dose should be obtained 1 or more years after the last PPSV23 vaccine dose.  Pneumococcal polysaccharide (PPSV23) vaccine. When PCV13 is also indicated, PCV13 should be obtained first. All  adults aged 40 years and older should be immunized. An adult younger than age 15 years who has certain medical conditions should be immunized. Any person who resides in a nursing home or long-term care facility should be immunized. An adult smoker should be immunized. People with an immunocompromised condition and certain other conditions should receive both PCV13 and PPSV23 vaccines. People with human immunodeficiency virus (HIV) infection should be immunized as soon as possible after diagnosis. Immunization during chemotherapy or radiation therapy should be avoided. Routine use of PPSV23 vaccine is not recommended for American Indians, [place], or people younger than 65 years unless there are medical conditions that require PPSV23 vaccine. When indicated, people who have unknown immunization and have no record of immunization should receive PPSV23 vaccine. One-time revaccination 5 years after the first dose of PPSV23 is recommended for people aged 19-64 years who have chronic kidney failure, nephrotic syndrome, asplenia, or immunocompromised conditions. People who received 1-2 doses of PPSV23 before age 30 years should receive another dose of PPSV23 vaccine at age 60 years or later if at least 5 years have passed since the previous dose. Doses of PPSV23 are not needed for people immunized with PPSV23 at or after age 48 years. Hepatitis A vaccine. Adults who wish to be protected from this disease, have certain high-risk conditions, work with hepatitis A-infected animals, work in hepatitis A research labs, or travel to or work in countries with a high rate of hepatitis A should be immunized. Adults who were previously unvaccinated and who anticipate close contact with an international adoptee during the first 60 days after arrival in the [place] from a country with a high rate of hepatitis A should be immunized. Hepatitis B vaccine. Adults should be immunized if they wish to be protected from this  disease, have certain high-risk conditions, may be exposed to blood or other infectious body fluids, are household contacts or sex partners of hepatitis B positive people, are clients or workers in certain care facilities, or travel to or work in countries with a high rate of hepatitis B.  Preventive Service / Frequency  Ages 6 to 1 Blood pressure check. Lipid and cholesterol check. Hepatitis C blood test.** / For any individual with known risks for hepatitis C. Skin self-exam. /  Monthly. Influenza vaccine. / Every year. Tetanus, diphtheria, and acellular pertussis (Tdap, Td) vaccine.** / Consult your health care provider. 1 dose of Td every 10 years. HPV vaccine. / 3 doses over 6 months, if 26 or younger. Measles, mumps, rubella (MMR) vaccine.** / You need at least 1 dose of MMR if you were born in 1957 or later. You may also need a second dose. Pneumococcal 13-valent conjugate (PCV13) vaccine.** / Consult your health care provider. Pneumococcal polysaccharide (PPSV23) vaccine.** / 1 to 2 doses if you smoke cigarettes or if you have certain conditions. Meningococcal vaccine.** / 1 dose if you are age 19 to 21 years and a first-year college student living in a residence hall, or have one of several medical conditions. You may also need additional booster doses. Hepatitis A vaccine.** / Consult your health care provider. Hepatitis B vaccine.** / Consult your health care provider. +++++++++ Recommend Adult Low Dose Aspirin or  coated  Aspirin 81 mg daily  To reduce risk of Colon Cancer 40 %,  Skin Cancer 26 % ,  Melanoma 46%  and  Pancreatic cancer 60% ++++++++++++++++++ Vitamin D goal  is between 70-100.  Please make sure that you are taking your Vitamin D as directed.  It is very important as a natural anti-inflammatory  helping hair, skin, and nails, as well as reducing stroke and heart attack risk.  It helps your bones and helps with mood. It also decreases numerous cancer risks  so please take it as directed.  Low Vit D is associated with a 200-300% higher risk for CANCER  and 200-300% higher risk for HEART   ATTACK  &  STROKE.   ...................................... It is also associated with higher death rate at younger ages,  autoimmune diseases like Rheumatoid arthritis, Lupus, Multiple Sclerosis.    Also many other serious conditions, like depression, Alzheimer's Dementia, infertility, muscle aches, fatigue, fibromyalgia - just to name a few. +++++++++++++++++++++ Recommend the book "The END of DIETING" by Dr Joel Fuhrman  & the book "The END of DIABETES " by Dr Joel Fuhrman At [employer] - get book & Audio CD's    Being diabetic has a  300% increased risk for heart attack, stroke, cancer, and alzheimer- type vascular dementia. It is very important that you work harder with diet by avoiding all foods that are white. Avoid white rice (brown & wild rice is OK), white potatoes (sweetpotatoes in moderation is OK), White bread or wheat bread or anything made out of white flour like bagels, donuts, rolls, buns, biscuits, cakes, pastries, cookies, pizza crust, and pasta (made from white flour & egg whites) - vegetarian pasta or spinach or wheat pasta is OK. Multigrain breads like Arnold's or [employer], or multigrain sandwich thins or flatbreads.  Diet, exercise and weight loss can reverse and cure diabetes in the early stages.  Diet, exercise and weight loss is very important in the control and prevention of complications of diabetes which affects every system in your body, ie. Brain - dementia/stroke, eyes - glaucoma/blindness, heart - heart attack/heart failure, kidneys - dialysis, stomach - gastric paralysis, intestines - malabsorption, nerves - severe painful neuritis, circulation - gangrene & loss of a leg(s), and finally cancer and Alzheimers.    I recommend avoid fried & greasy foods,  sweets/candy, white rice (brown or wild rice or Quinoa is OK), white potatoes  (sweet potatoes are OK) - anything made from white flour - bagels, doughnuts, rolls, buns, biscuits,white and wheat breads, pizza crust and   traditional pasta made of white flour & egg white(vegetarian pasta or spinach or wheat pasta is OK).  Multi-grain bread is OK - like multi-grain flat bread or sandwich thins. Avoid alcohol in excess. Exercise is also important.    Eat all the vegetables you want - avoid meat, especially red meat and dairy - especially cheese.  Cheese is the most concentrated form of trans-fats which is the worst thing to clog up our arteries. Veggie cheese is OK which can be found in the fresh produce section at [employer] or [employer] or [employer]  +++++++++++++++++++ DASH Eating Plan  DASH stands for "Dietary Approaches to Stop Hypertension."   The DASH eating plan is a healthy eating plan that has been shown to reduce high blood pressure (hypertension). Additional health benefits may include reducing the risk of type 2 diabetes mellitus, heart disease, and stroke. The DASH eating plan may also help with weight loss. WHAT DO I NEED TO KNOW ABOUT THE DASH EATING PLAN? For the DASH eating plan, you will follow these general guidelines: Choose foods with a percent daily value for sodium of less than 5% (as listed on the food label). Use salt-free seasonings or herbs instead of table salt or sea salt. Check with your health care provider or pharmacist before using salt substitutes. Eat lower-sodium products, often labeled as "lower sodium" or "no salt added." Eat fresh foods. Eat more vegetables, fruits, and low-fat dairy products. Choose whole grains. Look for the word "whole" as the first word in the ingredient list. Choose fish  Limit sweets, desserts, sugars, and sugary drinks. Choose heart-healthy fats. Eat veggie cheese  Eat more home-cooked food and less restaurant, buffet, and fast food. Limit fried foods. Cook foods using methods other than frying. Limit  canned vegetables. If you do use them, rinse them well to decrease the sodium. When eating at a restaurant, ask that your food be prepared with less salt, or no salt if possible.                      WHAT FOODS CAN I EAT? Read Dr Fara Olden Fuhrman's books on The End of Dieting & The End of Diabetes  Grains Whole grain or whole wheat bread. Brown rice. Whole grain or whole wheat pasta. Quinoa, bulgur, and whole grain cereals. Low-sodium cereals. Corn or whole wheat flour tortillas. Whole grain cornbread. Whole grain crackers. Low-sodium crackers.  Vegetables Fresh or frozen vegetables (raw, steamed, roasted, or grilled). Low-sodium or reduced-sodium tomato and vegetable juices. Low-sodium or reduced-sodium tomato sauce and paste. Low-sodium or reduced-sodium canned vegetables.   Fruits All fresh, canned (in natural juice), or frozen fruits.  Protein Products  All fish and seafood.  Dried beans, peas, or lentils. Unsalted nuts and seeds. Unsalted canned beans.  Dairy Low-fat dairy products, such as skim or 1% milk, 2% or reduced-fat cheeses, low-fat ricotta or cottage cheese, or plain low-fat yogurt. Low-sodium or reduced-sodium cheeses.  Fats and Oils Tub margarines without trans fats. Light or reduced-fat mayonnaise and salad dressings (reduced sodium). Avocado. Safflower, olive, or canola oils. Natural peanut or almond butter.  Other Unsalted popcorn and pretzels. The items listed above may not be a complete list of recommended foods or beverages. Contact your dietitian for more options.  +++++++++++++++++++  WHAT FOODS ARE NOT RECOMMENDED? Grains/ White flour or wheat flour White bread. White pasta. White rice. Refined cornbread. Bagels and croissants. Crackers that contain trans fat.  Vegetables  Creamed or fried vegetables. Vegetables  in a . Regular canned vegetables. Regular canned tomato sauce and paste. Regular tomato and vegetable juices.  Fruits Dried fruits. Canned fruit  in light or heavy syrup. Fruit juice.  Meat and Other Protein Products Meat in general - RED meat & White meat.  Fatty cuts of meat. Ribs, chicken wings, all processed meats as bacon, sausage, bologna, salami, fatback, hot dogs, bratwurst and packaged luncheon meats.  Dairy Whole or 2% milk, cream, half-and-half, and cream cheese. Whole-fat or sweetened yogurt. Full-fat cheeses or blue cheese. Non-dairy creamers and whipped toppings. Processed cheese, cheese spreads, or cheese curds.  Condiments Onion and garlic salt, seasoned salt, table salt, and sea salt. Canned and packaged gravies. Worcestershire sauce. Tartar sauce. Barbecue sauce. Teriyaki sauce. Soy sauce, including reduced sodium. Steak sauce. Fish sauce. Oyster sauce. Cocktail sauce. Horseradish. Ketchup and mustard. Meat flavorings and tenderizers. Bouillon cubes. Hot sauce. Tabasco sauce. Marinades. Taco seasonings. Relishes.  Fats and Oils Butter, stick margarine, lard, shortening and bacon fat. Coconut, palm kernel, or palm oils. Regular salad dressings.  Pickles and olives. Salted popcorn and pretzels.  The items listed above may not be a complete list of foods and beverages to avoid.   

## 2022-12-31 NOTE — Progress Notes (Unsigned)
Annual  Screening/Preventative Visit   & Comprehensive Evaluation & Examination   Future Appointments  Date Time Provider Department  01/01/2023                        cpe  3:00 PM Lucky Cowboy, MD [facility]  01/22/2024                         cpe  3:00 PM Lucky Cowboy, MD [facility]         This very nice 47 y.o.  MWM  presents for a Screening /Preventative Visit & comprehensive evaluation and management of multiple medical co-morbidities.  Patient has been followed for HTN, HLD, Prediabetes and Vitamin D Deficiency. Patient has GERD controlled on his meds.                                                      Patient had a  in [place] ( or [place] ) from a 90 day Rehab program hospitalization for Detox /Rehab from alcohol program for 90 days in fall 2023. Since then , he's been followed at [facility] for ongoing counseling        HTN predates since 40 (age 2) . Patient's BP has been controlled at home.  Today's BP at goal -                        . Patient denies any cardiac symptoms as chest pain, palpitations, shortness of breath, dizziness or ankle swelling.        Patient's hyperlipidemia is not  controlled with diet and Rosuvastatin . Patient denies myalgias or other medication SE's. Last lipids were not at goal :  Lab Results  Component Value Date   CHOL 191 05/10/2022   HDL 43 05/10/2022   LDLCALC 111 (H) 05/10/2022   TRIG 241 (H) 05/10/2022   CHOLHDL 4.4 05/10/2022         Patient has hx/o prediabetes since    and patient denies reactive hypoglycemic symptoms, visual blurring, diabetic polys or paresthesias. Last A1c was normal & at goal :   Lab Results  Component Value Date   HGBA1C 5.5 07/03/2022         Finally, patient has history of Vitamin D Deficiency of    and last vitamin D was   Lab Results  Component Value Date   VD25OH 60 07/03/2022       Current Outpatient Medications  Medication Instructions   bisoprolol   10 MG tablet TAKE 1 TABLET  EVERY DAY   busPIRone  10 MG tablet Take 1 tablet 2 to 3 x /day for Anxiety   VITAMIN D Take 5000 IU daily.    enalapril 20 MG tablet TAKE 2 TABLETS DAILY   Fish Oil    2,000 mg  Daily   gabapentin 300 MG capsule TAKE 1 CAPSULE 3 TIMES A   DAY   Hctz  25 MG tablet TAKE 1 TABLET EVERY MORNIN   hydrOXYzine 25 MG tablet TAKE 1 TABLET 2-3 TIMES A  DAY AS NEEDED FOR ANXIETY   lamoTRIgine (LAMICTAL)200 mg Daily   Magnesium 400 MG CAPS 1 capsule  Daily   naltrexone (DEPADE) 50 MG tablet Take  1  tablet  Daily  to Suppress Alcohol Cravings   omeprazole 40 MG capsule TAKE 1 CAPSULE DAILY    propranolol  10 MG tablet TAKE 1 TABLET 3-4 TIMES A  DAY AS NEEDED    rosuvastatin  20 MG tablet TAKE 1 TABLET 3 DAYS PER   WEEK FOR CHOLESTEROL   sertraline  100 MG tablet Take 2 tablets  Daily    testosterone cypio 200 MG/ML injec Inject 1 ml into Muscle every 7 days   traZODone ( 150 MG tablet Take  1/2 to 1 tablet  1 to 2 hours  before Bedtime  as needed for Sleep     Allergies  Allergen Reactions   Citalopram Other (See Comments)    anxiety   Pravastatin    Trazodone And Nefazodone    Wellbutrin [Bupropion]      Past Medical History:  Diagnosis Date   Anxiety    Depression    GERD (gastroesophageal reflux disease)    Hyperlipidemia    Hypertension    Hypogonadism male    Obesity    OSA on CPAP 2019   Vitamin D deficiency      Health Maintenance  Topic Date Due   HPV VACCINES (2 - 3-dose SCDM series) 07/25/2017   COLONOSCOPY (Pts 45-52yrs Insurance coverage will need to be confirmed)  Never done   INFLUENZA VACCINE  09/20/2021   TETANUS/TDAP  04/05/2025   Hepatitis C Screening  Completed   HIV Screening  Completed     Immunization History  Administered Date(s) Administered   HPV 9-valent 06/27/2017   Influenza Inj Mdck Quad  11/08/2016   Influenza Whole 12/19/2011   Influenza, Seasonal 02/02/2015   PPD Test 01/09/2013, 04/06/2015   Td 02/21/2004    Tdap 04/06/2015    Last Colon -   No past surgical history on file.   Family History  Problem Relation Age of Onset   Hypertension Mother    Depression Mother    Fibromyalgia Mother    Depression Brother    Hypertension Brother    Cancer Father 42       urethral/lung cancer   Depression Sister      Social History   Tobacco Use   Smoking status: Never   Smokeless tobacco: Never  Substance Use Topics   Alcohol use: Yes    Types: 12 drink(s) per week   Drug use: No      ROS Constitutional: Denies fever, chills, weight loss/gain, headaches, insomnia,  night sweats or change in appetite. Does c/o fatigue. Eyes: Denies redness, blurred vision, diplopia, discharge, itchy or watery eyes.  ENT: Denies discharge, congestion, post nasal drip, epistaxis, sore throat, earache, hearing loss, dental pain, Tinnitus, Vertigo, Sinus pain or snoring.  Cardio: Denies chest pain, palpitations, irregular heartbeat, syncope, dyspnea, diaphoresis, orthopnea, PND, claudication or edema Respiratory: denies cough, dyspnea, DOE, pleurisy, hoarseness, laryngitis or wheezing.  Gastrointestinal: Denies dysphagia, heartburn, reflux, water brash, pain, cramps, nausea, vomiting, bloating, diarrhea, constipation, hematemesis, melena, hematochezia, jaundice or hemorrhoids Genitourinary: Denies dysuria, frequency, urgency, nocturia, hesitancy, discharge, hematuria or flank pain Musculoskeletal: Denies arthralgia, myalgia, stiffness, Jt. Swelling, pain, limp or strain/sprain. Denies Falls. Skin: Denies puritis, rash, hives, warts, acne, eczema or change in skin lesion Neuro: No weakness, tremor, incoordination, spasms, paresthesia or pain Psychiatric: Denies confusion, memory loss or sensory loss. Denies Depression. Endocrine: Denies change in weight, skin, hair change, nocturia, and paresthesia, diabetic polys, visual blurring or hyper / hypo glycemic episodes.  Heme/Lymph: No excessive bleeding,  bruising or enlarged lymph nodes.   Physical Exam  There were no vitals taken for this visit.  General Appearance: Well nourished and well groomed and in no apparent distress.  Eyes: PERRLA, EOMs, conjunctiva no swelling or erythema, normal fundi and vessels. Sinuses: No frontal/maxillary tenderness ENT/Mouth: EACs patent / TMs  nl. Nares clear without erythema, swelling, mucoid exudates. Oral hygiene is good. No erythema, swelling, or exudate. Tongue normal, non-obstructing. Tonsils not swollen or erythematous. Hearing normal.  Neck: Supple, thyroid not palpable. No bruits, nodes or JVD. Respiratory: Respiratory effort normal.  BS equal and clear bilateral without rales, rhonci, wheezing or stridor. Cardio: Heart sounds are normal with regular rate and rhythm and no murmurs, rubs or gallops. Peripheral pulses are normal and equal bilaterally without edema. No aortic or femoral bruits. Chest: symmetric with normal excursions and percussion.  Abdomen: Soft, with Nl bowel sounds. Nontender, no guarding, rebound, hernias, masses, or organomegaly.  Lymphatics: Non tender without lymphadenopathy.  Musculoskeletal: Full ROM all peripheral extremities, joint stability, 5/5 strength, and normal gait. Skin: Warm and dry without rashes, lesions, cyanosis, clubbing or  ecchymosis.  Neuro: Cranial nerves intact, reflexes equal bilaterally. Normal muscle tone, no cerebellar symptoms. Sensation intact.  Pysch: Alert and oriented X 3 with normal affect, insight and judgment appropriate.    Assessment and Plan  1. Annual Preventative/Screening Exam    2. Essential hypertension  - EKG 12-Lead - Urinalysis, Routine w reflex microscopic - Microalbumin / creatinine urine ratio - CBC with Differential/Platelet - COMPLETE METABOLIC PANEL WITH GFR - Magnesium - TSH   3. Hyperlipidemia, mixed  - EKG 12-Lead - Lipid panel - TSH   4. Abnormal glucose  - EKG 12-Lead - Hemoglobin A1c -  Insulin, random   5. Gastroesophageal reflux disease  - CBC with Differential/Platelet   6. Vitamin D deficiency  - VITAMIN D 25 Hydroxy   7. Vitamin B12 deficiency  - Vitamin B12   8. Testosterone Deficiency  - Testosterone   9. Alcohol use disorder   10. Screening for prostate cancer  - PSA   11. Screening-pulmonary TB  - TB Skin Test   12. Screening for heart disease  - EKG 12-Lead   13. Screening for colorectal cancer  - POC Hemoccult Bld/Stl (   3-Cd Home Screen); Future   14. Family history of hypertension  - EKG 12-Lead   15. Fatigue  - Vitamin B12 - Iron, TIBC and Ferritin Panel - Testosterone - TSH   16. Medication management  - Testosterone - CBC with Differential/Platelet - COMPLETE METABOLIC PANEL WITH GFR - Magnesium - Lipid panel - TSH - Hemoglobin A1c - Insulin, random - VITAMIN D 25 Hydroxy             Patient was counseled in prudent diet, weight control to achieve/maintain BMI less than 25, BP monitoring, regular exercise and medications as discussed.  Discussed med effects and SE's. Routine screening labs and tests as requested with regular follow-up as recommended. Over 40 minutes of exam, counseling, chart review and high complex critical decision making was performed   Marinus Maw, MD

## 2023-01-01 ENCOUNTER — Encounter: Payer: Self-pay | Admitting: Internal Medicine

## 2023-01-01 ENCOUNTER — Ambulatory Visit (INDEPENDENT_AMBULATORY_CARE_PROVIDER_SITE_OTHER): Payer: 59 | Admitting: Internal Medicine

## 2023-01-01 VITALS — BP 126/80 | HR 69 | Temp 97.9°F | Resp 16 | Ht 76.0 in | Wt 249.4 lb

## 2023-01-01 DIAGNOSIS — E782 Mixed hyperlipidemia: Secondary | ICD-10-CM

## 2023-01-01 DIAGNOSIS — Z8249 Family history of ischemic heart disease and other diseases of the circulatory system: Secondary | ICD-10-CM

## 2023-01-01 DIAGNOSIS — E538 Deficiency of other specified B group vitamins: Secondary | ICD-10-CM

## 2023-01-01 DIAGNOSIS — F3181 Bipolar II disorder: Secondary | ICD-10-CM

## 2023-01-01 DIAGNOSIS — Z0001 Encounter for general adult medical examination with abnormal findings: Secondary | ICD-10-CM

## 2023-01-01 DIAGNOSIS — Z23 Encounter for immunization: Secondary | ICD-10-CM

## 2023-01-01 DIAGNOSIS — Z1211 Encounter for screening for malignant neoplasm of colon: Secondary | ICD-10-CM

## 2023-01-01 DIAGNOSIS — Z0000 Encounter for general adult medical examination without abnormal findings: Secondary | ICD-10-CM | POA: Diagnosis not present

## 2023-01-01 DIAGNOSIS — R7309 Other abnormal glucose: Secondary | ICD-10-CM

## 2023-01-01 DIAGNOSIS — R5383 Other fatigue: Secondary | ICD-10-CM

## 2023-01-01 DIAGNOSIS — I10 Essential (primary) hypertension: Secondary | ICD-10-CM

## 2023-01-01 DIAGNOSIS — F1090 Alcohol use, unspecified, uncomplicated: Secondary | ICD-10-CM

## 2023-01-01 DIAGNOSIS — Z79899 Other long term (current) drug therapy: Secondary | ICD-10-CM

## 2023-01-01 DIAGNOSIS — N529 Male erectile dysfunction, unspecified: Secondary | ICD-10-CM

## 2023-01-01 DIAGNOSIS — Z136 Encounter for screening for cardiovascular disorders: Secondary | ICD-10-CM | POA: Diagnosis not present

## 2023-01-01 DIAGNOSIS — K219 Gastro-esophageal reflux disease without esophagitis: Secondary | ICD-10-CM

## 2023-01-01 DIAGNOSIS — Z111 Encounter for screening for respiratory tuberculosis: Secondary | ICD-10-CM | POA: Diagnosis not present

## 2023-01-01 DIAGNOSIS — E559 Vitamin D deficiency, unspecified: Secondary | ICD-10-CM

## 2023-01-01 DIAGNOSIS — Z125 Encounter for screening for malignant neoplasm of prostate: Secondary | ICD-10-CM

## 2023-01-01 MED ORDER — LAMOTRIGINE 200 MG PO TABS: ORAL_TABLET | ORAL | 3 refills | Status: DC

## 2023-01-02 LAB — LIPID PANEL
Cholesterol: 265 mg/dL — ABNORMAL HIGH (ref <200)
HDL: 45 mg/dL (ref >=40)
LDL Cholesterol (Calc): 189 mg/dL — ABNORMAL HIGH
Non-HDL Cholesterol (Calc): 220 mg/dL — ABNORMAL HIGH (ref <130)
Total CHOL/HDL Ratio: 5.9 (calc) — ABNORMAL HIGH (ref <5.0)
Triglycerides: 150 mg/dL — ABNORMAL HIGH (ref <150)

## 2023-01-02 LAB — URINALYSIS, ROUTINE W REFLEX MICROSCOPIC
Bilirubin Urine: NEGATIVE
Glucose, UA: NEGATIVE
Hgb urine dipstick: NEGATIVE
Ketones, ur: NEGATIVE
Leukocytes,Ua: NEGATIVE
Nitrite: NEGATIVE
Protein, ur: NEGATIVE
Specific Gravity, Urine: 1.015 (ref 1.001–1.035)
pH: 7 (ref 5.0–8.0)

## 2023-01-02 LAB — CBC WITH DIFFERENTIAL/PLATELET
Absolute Lymphocytes: 1415 {cells}/uL (ref 850–3900)
Absolute Monocytes: 394 {cells}/uL (ref 200–950)
Basophils Absolute: 22 {cells}/uL (ref 0–200)
Basophils Relative: 0.4 %
Eosinophils Absolute: 151 {cells}/uL (ref 15–500)
Eosinophils Relative: 2.8 %
HCT: 43.4 % (ref 38.5–50.0)
Hemoglobin: 14.6 g/dL (ref 13.2–17.1)
MCH: 31 pg (ref 27.0–33.0)
MCHC: 33.6 g/dL (ref 32.0–36.0)
MCV: 92.1 fL (ref 80.0–100.0)
MPV: 11.3 fL (ref 7.5–12.5)
Monocytes Relative: 7.3 %
Neutro Abs: 3418 {cells}/uL (ref 1500–7800)
Neutrophils Relative %: 63.3 %
Platelets: 231 10*3/uL (ref 140–400)
RBC: 4.71 10*6/uL (ref 4.20–5.80)
RDW: 12.6 % (ref 11.0–15.0)
Total Lymphocyte: 26.2 %
WBC: 5.4 10*3/uL (ref 3.8–10.8)

## 2023-01-02 LAB — COMPLETE METABOLIC PANEL WITH GFR
AG Ratio: 2.7 (calc) — ABNORMAL HIGH (ref 1.0–2.5)
ALT: 32 U/L (ref 9–46)
AST: 27 U/L (ref 10–40)
Albumin: 5.1 g/dL (ref 3.6–5.1)
Alkaline phosphatase (APISO): 64 U/L (ref 36–130)
BUN: 12 mg/dL (ref 7–25)
CO2: 29 mmol/L (ref 20–32)
Calcium: 9.7 mg/dL (ref 8.6–10.3)
Chloride: 102 mmol/L (ref 98–110)
Creat: 1.06 mg/dL (ref 0.60–1.29)
Globulin: 1.9 g/dL (ref 1.9–3.7)
Glucose, Bld: 99 mg/dL (ref 65–99)
Potassium: 4.4 mmol/L (ref 3.5–5.3)
Sodium: 140 mmol/L (ref 135–146)
Total Bilirubin: 0.6 mg/dL (ref 0.2–1.2)
Total Protein: 7 g/dL (ref 6.1–8.1)
eGFR: 87 mL/min/{1.73_m2} (ref >=60)

## 2023-01-02 LAB — VITAMIN B12: Vitamin B-12: 1041 pg/mL (ref 200–1100)

## 2023-01-02 LAB — MICROALBUMIN / CREATININE URINE RATIO
Creatinine, Urine: 102 mg/dL (ref 20–320)
Microalb Creat Ratio: 4 mg/g{creat} (ref <30)
Microalb, Ur: 0.4 mg/dL

## 2023-01-02 LAB — TEST AUTHORIZATION: TEST CODE:: 17306

## 2023-01-02 LAB — HEMOGLOBIN A1C
Hgb A1c MFr Bld: 5.6 %{Hb} (ref <5.7)
Mean Plasma Glucose: 114 mg/dL
eAG (mmol/L): 6.3 mmol/L

## 2023-01-02 LAB — IRON,TIBC AND FERRITIN PANEL
%SAT: 48 % (ref 20–48)
Ferritin: 85 ng/mL (ref 38–380)
Iron: 144 ug/dL (ref 50–180)
TIBC: 302 ug/dL (ref 250–425)

## 2023-01-02 LAB — INSULIN, RANDOM: Insulin: 6 u[IU]/mL

## 2023-01-02 LAB — TESTOSTERONE: Testosterone: 255 ng/dL (ref 250–827)

## 2023-01-02 LAB — TSH: TSH: 1.96 m[IU]/L (ref 0.40–4.50)

## 2023-01-02 LAB — MAGNESIUM: Magnesium: 2.3 mg/dL (ref 1.5–2.5)

## 2023-01-02 LAB — PSA: PSA: 0.15 ng/mL (ref <=4.00)

## 2023-01-02 NOTE — Progress Notes (Signed)
<>*<>*<>*<>*<>*<>*<>*<>*<>*<>*<>*<>*<>*<>*<>*<>*<>*<>*<>*<>*<>*<>*<>*<>*<> <>*<>*<>*<>*<>*<>*<>*<>*<>*<>*<>*<>*<>*<>*<>*<>*<>*<>*<>*<>*<>*<>*<>*<>*<>  -Test results slightly outside the reference range are not unusual. If there is anything important, I will review this with you,  otherwise it is considered normal test values.  If you have further questions,  please do not hesitate to contact me at the office or via My Chart.   <>*<>*<>*<>*<>*<>*<>*<>*<>*<>*<>*<>*<>*<>*<>*<>*<>*<>*<>*<>*<>*<>*<>*<>*<> <>*<>*<>*<>*<>*<>*<>*<>*<>*<>*<>*<>*<>*<>*<>*<>*<>*<>*<>*<>*<>*<>*<>*<>*<>  -   - Total Chol =   265   is very high risk for Heart Attack /Stroke /Vascular Dementia     ( Ideal or Goal is less than 180 ! )  & - Bad /Dangerous LDL Chol =  189    - - >> Sitting on a time Bomb !     ( Ideal or Goal is less than 70 ! )   - Treating with meds to lower Cholesterol is treating the result                                           & NOT treating the cause  - The cause is Bad Diet !  - But need to continue Crestor (Rosuvastatin) 3 x / week   & sending  in a new Rx for Zetia (Ezetimibe) to Also take 1 tablet Daily                                                       to reverse some of the Damage already done   - Read or listen to  Dr Gerri Spore 's book  " How Not to Die ! "    - Recommend a stricter plant based low cholesterol diet   - Cholesterol only comes from animal sources                                                                  - ie. meat, dairy, egg yolks  - Eat all the vegetables you want.                                                   - Avoid Meat, Avoid Meat , Avoid Meat  ! ! !                                                   - Especially red meat - Beef AND Pork  - Avoid cheese & dairy - milk & ice cream.   - Cheese is the most concentrated form of trans-fats which  is the worst thing to clog up our arteries.   - Veggie cheese is OK which can be found in                                              the fresh produce section at                                                          [employer] or [employer] or [employer]  <>*<>*<>*<>*<>*<>*<>*<>*<>*<>*<>*<>*<>*<>*<>*<>*<>*<>*<>*<>*<>*<>*<>*<>*<> <>*<>*<>*<>*<>*<>*<>*<>*<>*<>*<>*<>*<>*<>*<>*<>*<>*<>*<>*<>*<>*<>*<>*<>*<>  -  Vitamin B12 is Excellent level ,  So please keep dose same   <>*<>*<>*<>*<>*<>*<>*<>*<>*<>*<>*<>*<>*<>*<>*<>*<>*<>*<>*<>*<>*<>*<>*<>*<> <>*<>*<>*<>*<>*<>*<>*<>*<>*<>*<>*<>*<>*<>*<>*<>*<>*<>*<>*<>*<>*<>*<>*<>*<>  -  Iron levels - Normal & OK   <>*<>*<>*<>*<>*<>*<>*<>*<>*<>*<>*<>*<>*<>*<>*<>*<>*<>*<>*<>*<>*<>*<>*<>*<> <>*<>*<>*<>*<>*<>*<>*<>*<>*<>*<>*<>*<>*<>*<>*<>*<>*<>*<>*<>*<>*<>*<>*<>*<>  -  PSA - very low - No Prostate Cancer  -  Great  <>*<>*<>*<>*<>*<>*<>*<>*<>*<>*<>*<>*<>*<>*<>*<>*<>*<>*<>*<>*<>*<>*<>*<>*<> <>*<>*<>*<>*<>*<>*<>*<>*<>*<>*<>*<>*<>*<>*<>*<>*<>*<>*<>*<>*<>*<>*<>*<>*<>  -  Testosterone on the low side of Normal - but is normal ,    So recommend that you Take Zinc 50 mg / day - which will usually help raise te testosterone level if it is low   Also   9 Ways to Naturally Increase Testosterone Levels  1.   Lose Weight  If you're overweight, shedding the excess pounds may increase your  testosterone levels, according to research presented at the Endocrine  Society's 2012 meeting. Overweight men are more likely to have low  testosterone levels to begin with, so this is an important trick to increase  your body's testosterone production when you need it most.  2.   High-Intensity Exercise like Peak Fitness   Short intense exercise has a proven positive effect on increasing  testosterone levels and preventing its decline. That's unlike aerobics  or prolonged moderate exercise, which have shown to have negative or  no effect  on testosterone levels. Having a whey protein meal after exercise can further enhance the satiety/testosterone-boosting impact   (hunger hormones cause the opposite effect on your testosterone and libido).   Here's a summary of what a typical high-intensity Peak Fitness routine  might look like:  " Warm up for three minutes  " Exercise as hard and fast as you can for 30 seconds.  You should feel like you couldn't possibly go on another few seconds  " Recover at a slow to moderate pace for 90 seconds  " Repeat the high intensity exercise and recovery 7 more times .   3.   Consume Plenty of Zinc  ( 50 mg / day )   The mineral zinc is important for testosterone production and  supplementing your diet for as little as six weeks has been shown  to cause a marked improvement in testosterone among men with  low levels. Likewise, research has shown that restricting dietary  sources of zinc leads to a significant decrease in testosterone,   while zinc supplementation increases it - and  even protects men from exercised-induced reductions in  testosterone levels.  It's estimated that up to 45 percent of adults over the age of 60 may   have lower than recommended zinc intakes; even when dietary  supplements were added in,  an estimated 20-25 percent of older adults still had                                                                    inadequate zinc intakes,  according to a [employer] and Nutrition Examination Survey.  Your diet is the best source of zinc; along with protein-rich foods like    fish, other good dietary sources of zinc include   raw milk, raw cheese, beans, and yogurt or kefir made from raw milk.   It can be difficult to obtain enough dietary zinc if you're a vegetarian, and   also for meat-eaters as well, largely because of conventional farming  methods that rely heavily on chemical  fertilizers and pesticides.   These chemicals deplete the soil of nutrients ... nutrients like zinc that  must be absorbed by plants in order to be passed on to you.  In many cases, you may further deplete the nutrients in your food by the  way you prepare it. For most food, cooking it will drastically reduce its  levels of nutrients like zinc ...                                         particularly over-cooking, which many people do.  If you decide to use a zinc supplement, stick to a dosage of 50 mg / day,  as this is the recommended adult dose. Taking too much zinc can  interfere with your body's ability to absorb other minerals, especially  copper, and may cause nausea as a side effect.  4.   Strength Training  In addition to Peak Fitness, strength training is also known to boost  testosterone levels, provided you are doing so intensely enough.  When strength training to boost testosterone, you'll want to increase  the weight and lower your number of reps, and then focus on exercises  that work a large number of muscles, such as dead lifts or squats.   You can "turbo-charge" your weight training by going slower.  By slowing down your movement, you're actually turning it into a  high-intensity exercise. Super Slow movement allows your muscle, at the microscopic level, to access the maximum number of  cross-bridges between the protein filaments that                                                                 produce movement in the muscle.   5.   Optimize Your Vitamin D Levels  Vitamin D, a steroid hormone, is essential for the healthy development of  the nucleus of the sperm cell, and helps maintain semen quality and  sperm count.   Vitamin D also increases levels of testosterone, which  may boost libido.  In one study, overweight men who were given vitamin D supplements  had a significant increase in testosterone levels after one year  6.   Reduce Stress  When you're  under a lot of stress, your body releases high levels of  the stress hormone cortisol. This hormone actually blocks the effects  of testosterone, presumably because, from a biological standpoint, testosterone-associated behaviors (mating, competing, aggression) may  have lowered your chances of survival in an emergency   (hence, the "fight or flight" response is dominant, courtesy of cortisol).  7.   Limit or Eliminate Sugar from Your Diet  Testosterone levels decrease after you eat sugar, which is likely because   the sugar leads to a high insulin level, another factor leading to low testosterone. Based on USDA estimates,                   the average American consumes 12 teaspoons of sugar a day, which                                          equates to about TWO TONS of sugar during a lifetime.  8.   Eat Healthy Fats  By healthy, this means not only mono- and polyunsaturated fats, like that  found in avocadoes and nuts, but also saturated, as these are essential for  building testosterone. Research shows that a diet with less than 40 percent  of energy as fat (and that mainly from animal sources, i.e. saturated) lead to  a decrease in testosterone levels. ie eat less animal products - as  Meat , poultry and dairy.  Experts agree that the ideal diet includes somewhere between 50-70 percent fat.   It's important to understand that your body requires saturated fats from  animal and vegetable sources (such as meat, dairy, certain oils, and  tropical plants like coconut) for optimal functioning, and  if you neglect this important food group in favor of                                                                    sugar, grains and other starchy carbs,   your health and weight are almost guaranteed to suffer.   Examples of healthy fats you can eat more of to give your  testosterone levels a boost include:  Olives and Olive oil  Coconuts and coconut oil   Butter made from raw  grass-fed organic milk  Raw nuts, such as, almonds or pecans Organic pastured egg yolks  Avocados  Grass-fed meats Palm oil Unheated organic nut oils  9.   Boost Your Intake of Branch Chain Amino Acids (BCAA)                                                                           from Foods Like  Whey Protein  Research  suggests that BCAAs result in higher testosterone levels,  particularly when taken along with resistance training.    While BCAAs are available in supplement form, you'll find the highest concentrations of BCAAs like leucine in whey protein.  Even when getting leucine from your natural food supply, it's often wasted or used as a building block instead of an anabolic agent. So to create the correct anabolic environment, you need to boost leucine consumption way beyond mere maintenance levels. That said, keep in mind that using leucine as a free form amino acid can be highly counterproductive as when free form amino acids are artificially administrated, they rapidly enter your circulation while disrupting insulin function, and impairing your body's glycemic control. Food-based leucine is really the ideal form that can benefit your muscles without side effects.   <>*<>*<>*<>*<>*<>*<>*<>*<>*<>*<>*<>*<>*<>*<>*<>*<>*<>*<>*<>*<>*<>*<>*<>*<> <>*<>*<>*<>*<>*<>*<>*<>*<>*<>*<>*<>*<>*<>*<>*<>*<>*<>*<>*<>*<>*<>*<>*<>*<>  -  A1c - Normal - No Diabetes  - Great   <>*<>*<>*<>*<>*<>*<>*<>*<>*<>*<>*<>*<>*<>*<>*<>*<>*<>*<>*<>*<>*<>*<>*<>*<> <>*<>*<>*<>*<>*<>*<>*<>*<>*<>*<>*<>*<>*<>*<>*<>*<>*<>*<>*<>*<>*<>*<>*<>*<>  -  All Else - CBC - Kidneys - Electrolytes - Liver - Magnesium & Thyroid    - all  Normal / OK  <>*<>*<>*<>*<>*<>*<>*<>*<>*<>*<>*<>*<>*<>*<>*<>*<>*<>*<>*<>*<>*<>*<>*<>*<> <>*<>*<>*<>*<>*<>*<>*<>*<>*<>*<>*<>*<>*<>*<>*<>*<>*<>*<>*<>*<>*<>*<>*<>*<>

## 2023-01-08 ENCOUNTER — Encounter: Payer: Self-pay | Admitting: Internal Medicine

## 2023-01-08 ENCOUNTER — Other Ambulatory Visit (HOSPITAL_COMMUNITY): Payer: Self-pay | Admitting: Medical

## 2023-01-08 DIAGNOSIS — F1021 Alcohol dependence, in remission: Secondary | ICD-10-CM

## 2023-01-09 ENCOUNTER — Other Ambulatory Visit: Payer: Self-pay | Admitting: Internal Medicine

## 2023-01-09 DIAGNOSIS — F418 Other specified anxiety disorders: Secondary | ICD-10-CM

## 2023-01-09 MED ORDER — SERTRALINE HCL 100 MG PO TABS: ORAL_TABLET | ORAL | 3 refills | Status: DC

## 2023-01-14 ENCOUNTER — Other Ambulatory Visit (HOSPITAL_COMMUNITY): Payer: Self-pay | Admitting: Medical

## 2023-01-14 ENCOUNTER — Other Ambulatory Visit: Payer: Self-pay | Admitting: Nurse Practitioner

## 2023-02-06 ENCOUNTER — Other Ambulatory Visit (HOSPITAL_COMMUNITY): Payer: Self-pay | Admitting: Medical

## 2023-02-13 ENCOUNTER — Other Ambulatory Visit (HOSPITAL_COMMUNITY): Payer: Self-pay | Admitting: Medical

## 2023-02-20 ENCOUNTER — Ambulatory Visit (INDEPENDENT_AMBULATORY_CARE_PROVIDER_SITE_OTHER): Payer: 59 | Admitting: Licensed Clinical Social Worker

## 2023-02-20 ENCOUNTER — Encounter (HOSPITAL_COMMUNITY): Payer: Self-pay

## 2023-02-20 DIAGNOSIS — F1321 Sedative, hypnotic or anxiolytic dependence, in remission: Secondary | ICD-10-CM

## 2023-02-20 DIAGNOSIS — F17200 Nicotine dependence, unspecified, uncomplicated: Secondary | ICD-10-CM

## 2023-02-20 DIAGNOSIS — F1021 Alcohol dependence, in remission: Secondary | ICD-10-CM | POA: Diagnosis not present

## 2023-02-20 DIAGNOSIS — F3181 Bipolar II disorder: Secondary | ICD-10-CM

## 2023-02-20 NOTE — Progress Notes (Signed)
 THERAPIST PROGRESS NOTE  Session Time: 1:02 p.m. to 2:03 p.m.   Type of Therapy: Individual   Therapist Response/Interventions: Solution Focused/The therapist observes that Ian Zavala has gotten off track as he did not follow-up with his recommended aftercare following his completion of [facility]. The therapist also notes that it was recommended that Ian Zavala consider changing Sponsors when in IOP with this therapist suggesting that changing to a new one would have been preferable to having no Sponsor and no meetings as all.  The therapist shares with Ian Zavala information concerning the need for anti-depressant medications to be used with caution, if at all, in treating bipolar disorder observing that his condition has apparently worsened significantly as a result of his Zoloft  being increased from 100 mg to 200 mg every day. The therapist recommends that Ian Zavala have his psychotropic medications prescribed by a psychiatrist rather than a PCP and that in addition to a max dosage of Zoloft  being contraindicated for his diagnosis that having it reduced and/or changed to another mood agent will address his sexual side effects from the Zoloft .   The therapist talks to Ian Zavala about inviting his wife to attend his next session which Ian Zavala is eager to do.   Treatment Goals addressed:  Active     Substance Use     Ian Zavala will abstain from drugs and alcohol  for a continuous period of 6 months per self-report and random urine drug screens and/or breathalyzers as indicated. (Initial)     Start:  02/20/23    Expected End:  08/20/23         Ian Zavala will report a reduction in his symptoms of depression as evidenced by having a PHQ-9 score of 4 or less.  He will no longer have thoughts of suicide without plan or intent. (Initial)     Start:  02/20/23    Expected End:  08/20/23         The therapist says Ian Zavala and being able to identify and avoid triggers for using substances while helping him to overcome any barriers or obstacles  and developing a sober support system.     Start:  02/20/23         The therapist will assist Ian Zavala in being able to identify and change thoughts and behaviors that are contributing to his severe depression and anxiety.     Start:  02/20/23            Summary: Ian Zavala presents today having not seen this therapist since April of this year. He says that he has been kind of struggling and his wife encouraged him to come back in even though he did not want to as he does not have $60 to come in. He says that he is kind of falling back into the same habits without drinking. He says that he did have one slip up back in August. He drank 12 beers noting that he was pretty functional likely as he was on the Naltrexone . He drank again saying that it downward spiraled pretty quick. The depression and not thinking straight kicked in. He says that he had not drunk since. He dropped his Sponsor having told him that he thought he could get sober without AA.  He says that his PCP increased his Sertraline  to 200 mg every day. He is on Lamictal  200 mg q d. He says that his Sertraline  was increased to 200 mg back in the summer before the relapse. He is also taking Trazodone , Buspar , Gabapentin , Propanolol p.r.n., and  Naltrexone .   He says that he has a voice in his head telling him negative things noting that he has a lot of suicidal thoughts with no plan or intent. He has these thoughts pretty often such as every three to five days. He says that he feels like he has a demon in his head beating him down all the time.   Ian Zavala says that he has people in his life telling him to stop taking his psych meds and to just exercise and do other things. He says that it would be very helpful for his wife to attend a session next week to better understand his condition and the recommended treatment.   Progress Towards Goals: Initial  Suicidal/Homicidal: No SI or HI  Plan: Return again in 1 weeks with his wife to  attend as well. He will see Dr. Carvin this Thursday for a tele-video psychiatric evaluation.  Diagnosis: Alcohol  Use Disorder Severe, in early remission; Sedative Use Disorder, Severe, in early remission; Bipolar II Disorder; and Tobacco Use Disorder  Collaboration of Care: Other N/A  Patient/Guardian was advised Release of Information must be obtained prior to any record release in order to collaborate their care with an outside provider. Patient/Guardian was advised if they have not already done so to contact the registration department to sign all necessary forms in order for us  to release information regarding their care.   Consent: Patient/Guardian gives verbal consent for treatment and assignment of benefits for services provided during this visit. Patient/Guardian expressed understanding and agreed to proceed.   Zell Maier, MA, LCSW, [facility], LCAS 02/20/2023

## 2023-02-22 ENCOUNTER — Encounter (HOSPITAL_COMMUNITY): Payer: Self-pay | Admitting: Psychiatry

## 2023-02-22 ENCOUNTER — Telehealth (HOSPITAL_COMMUNITY): Payer: 59 | Admitting: Psychiatry

## 2023-02-22 ENCOUNTER — Other Ambulatory Visit: Payer: Self-pay | Admitting: Nurse Practitioner

## 2023-02-22 DIAGNOSIS — F1321 Sedative, hypnotic or anxiolytic dependence, in remission: Secondary | ICD-10-CM

## 2023-02-22 DIAGNOSIS — F1021 Alcohol dependence, in remission: Secondary | ICD-10-CM | POA: Diagnosis not present

## 2023-02-22 DIAGNOSIS — F332 Major depressive disorder, recurrent severe without psychotic features: Secondary | ICD-10-CM | POA: Diagnosis not present

## 2023-02-22 DIAGNOSIS — F411 Generalized anxiety disorder: Secondary | ICD-10-CM | POA: Diagnosis not present

## 2023-02-22 MED ORDER — ARIPIPRAZOLE 2 MG PO TABS: 2.0000 mg | ORAL_TABLET | Freq: Every day | ORAL | 2 refills | Status: DC

## 2023-02-22 MED ORDER — GABAPENTIN 300 MG PO CAPS: 300.0000 mg | ORAL_CAPSULE | Freq: Three times a day (TID) | ORAL | 0 refills | Status: DC | PRN
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
## 2023-02-27 ENCOUNTER — Ambulatory Visit (HOSPITAL_COMMUNITY): Payer: 59 | Admitting: Licensed Clinical Social Worker

## 2023-03-06 ENCOUNTER — Ambulatory Visit (HOSPITAL_COMMUNITY): Payer: 59 | Admitting: Licensed Clinical Social Worker

## 2023-03-13 ENCOUNTER — Encounter: Payer: Self-pay | Admitting: Internal Medicine

## 2023-03-13 ENCOUNTER — Other Ambulatory Visit (HOSPITAL_COMMUNITY): Payer: Self-pay | Admitting: Medical

## 2023-03-13 ENCOUNTER — Ambulatory Visit (INDEPENDENT_AMBULATORY_CARE_PROVIDER_SITE_OTHER): Payer: 59 | Admitting: Licensed Clinical Social Worker

## 2023-03-13 DIAGNOSIS — F1021 Alcohol dependence, in remission: Secondary | ICD-10-CM

## 2023-03-13 DIAGNOSIS — F3181 Bipolar II disorder: Secondary | ICD-10-CM | POA: Diagnosis not present

## 2023-03-13 DIAGNOSIS — F1321 Sedative, hypnotic or anxiolytic dependence, in remission: Secondary | ICD-10-CM

## 2023-03-13 DIAGNOSIS — E291 Testicular hypofunction: Secondary | ICD-10-CM

## 2023-03-13 NOTE — Progress Notes (Unsigned)
THERAPIST PROGRESS NOTE  Session Time: 1:02 p.m. to 2:30 p.m.    Virtual Visit via Video Note   I connected with [name] at 1:02 p.m.     EST by a video enabled telemedicine application and verified that I am speaking with the correct person using two identifiers.   Location: Patient: home Provider: [facility] office   I discussed the limitations of evaluation and management by telemedicine and the availability of in person appointments. The patient expressed understanding and agreed to proceed.  Type of Therapy: Individual   Therapist Response/Interventions: Solution Focused/The therapist   Treatment Goals addressed:  Active     Substance Use     [name] will abstain from drugs and alcohol for a continuous period of 6 months per self-report and random urine drug screens and/or breathalyzers as indicated. (Progressing)     Start:  02/20/23    Expected End:  08/20/23         [name] will report a reduction in his symptoms of depression as evidenced by having a PHQ-9 score of 4 or less.  He will no longer have thoughts of suicide without plan or intent. (Not Progressing)     Start:  02/20/23    Expected End:  08/20/23         The therapist says [name] and being able to identify and avoid triggers for using substances while helping him to overcome any barriers or obstacles and developing a sober support system.     Start:  02/20/23         The therapist will assist [name] in being able to identify and change thoughts and behaviors that are contributing to his severe depression and anxiety.     Start:  02/20/23               Summary: [name] presents get off call and take a nap no joy miserable all the time work miserable there  Progress Towards Goals: Not progressing  Suicidal/Homicidal: No SI or HI  Plan: Return again in 2 weeks  Diagnosis: Alcohol Use Disorder Severe, in early remission; Sedative Use Disorder, Severe, in early remission; Bipolar II Disorder; and Tobacco Use  Disorder  Collaboration of Care: Wife joins the latter half of the session with [name]'s permission  Patient/Guardian was advised Release of Information must be obtained prior to any record release in order to collaborate their care with an outside provider. Patient/Guardian was advised if they have not already done so to contact the registration department to sign all necessary forms in order for [place] to release information regarding their care.   Consent: Patient/Guardian gives verbal consent for treatment and assignment of benefits for services provided during this visit. Patient/Guardian expressed understanding and agreed to proceed.   Myrna Blazer, MA, LCSW, [facility], LCAS 03/13/2023

## 2023-03-19 MED ORDER — TESTOSTERONE CYPIONATE 200 MG/ML IM SOLN: INTRAMUSCULAR | 1 refills | Status: AC

## 2023-03-20 ENCOUNTER — Other Ambulatory Visit: Payer: Self-pay | Admitting: Internal Medicine

## 2023-03-20 DIAGNOSIS — G4701 Insomnia due to medical condition: Secondary | ICD-10-CM

## 2023-03-20 DIAGNOSIS — F419 Anxiety disorder, unspecified: Secondary | ICD-10-CM

## 2023-03-22 NOTE — Telephone Encounter (Signed)
Pt no longer under my care

## 2023-03-27 ENCOUNTER — Ambulatory Visit (INDEPENDENT_AMBULATORY_CARE_PROVIDER_SITE_OTHER): Payer: 59 | Admitting: Licensed Clinical Social Worker

## 2023-03-27 DIAGNOSIS — F3181 Bipolar II disorder: Secondary | ICD-10-CM

## 2023-03-27 DIAGNOSIS — F1021 Alcohol dependence, in remission: Secondary | ICD-10-CM

## 2023-03-27 DIAGNOSIS — F1321 Sedative, hypnotic or anxiolytic dependence, in remission: Secondary | ICD-10-CM

## 2023-03-27 DIAGNOSIS — F411 Generalized anxiety disorder: Secondary | ICD-10-CM

## 2023-03-27 NOTE — Progress Notes (Signed)
 THERAPIST PROGRESS NOTE  Session Time: 12:59 p.m. to 2 p.m.   Virtual Visit via Video Note   I connected with Ian Zavala at 12:59 p.m.     EST by a video enabled telemedicine application and verified that I am speaking with the correct person using two identifiers.   Location: Patient: home Provider: [facility] office   I discussed the limitations of evaluation and management by telemedicine and the availability of in person appointments. The patient expressed understanding and agreed to proceed.  Type of Therapy: Individual   Therapist Response/Interventions: CBT/The therapist observes [name]'s irrational thoughts while suggesting other ways to look at these situations and/or counter these thoughts. Ian Zavala suggests that it is not surprising that he does not want to visit his mother based on his history with her as a child and the behaviors he describes adding that [name]'s sister apparently shares these same feelings.   Treatment Goals addressed:  Active     Substance Use     Ian Zavala will abstain from drugs and alcohol  for a continuous period of 6 months per self-report and random urine drug screens and/or breathalyzers as indicated. (Progressing)     Start:  02/20/23    Expected End:  08/20/23         Ian Zavala will report a reduction in his symptoms of depression as evidenced by having a PHQ-9 score of 4 or less.  He will no longer have thoughts of suicide without plan or intent. (Progressing)     Start:  02/20/23    Expected End:  08/20/23         The therapist says Ian Zavala and being able to identify and avoid triggers for using substances while helping him to overcome any barriers or obstacles and developing a sober support system.     Start:  02/20/23         The therapist will assist Ian Zavala in being able to identify and change thoughts and behaviors that are contributing to his severe depression and anxiety.     Start:  02/20/23                  Summary: Ian Zavala presents saying that  he is doing a lot better concerning the general feelings of depression and worthless but is not where he wants to be.   Some days during the past few weeks, he would note that he felt pretty good. He says that part of this was due to having the flu and not having to work for two or three days.  His mom called him a week-and-a-half ago as she fell at her apartment breaking both her ankles. He says that his mom's whole mindset is complaining that neither Ian Zavala or his sister are doing anything for her. His wife says that Ian Zavala is not setting healthy boundaries but just avoiding it. He says that he has gotten his mind stressed out thinking about that. He says that his father's wife wants to meet Ian Zavala and his wife for dinner tonight. He says that he feels like she judged him about going to treatment.   The major focus of the session involves [name]'s negative self-talk and ways to counter it in addition to the need for him to eventually find at least one thing that he looks forward to doing each week as opposed to having a schedule full of things he does not want to do.   Ian Zavala talks about having some mild OCD symptoms such has having to tear  a paper a certain way when throwing it away or having to retrieve it from the trash. He says that as a child that he had to touch the ceiling in his room each night before going to sleep.      Progress Towards Goals: Progressing  Suicidal/Homicidal: Ian Zavala reports no HI and says that his SI has gone away  Plan: Return again in 3 weeks   Diagnosis: Alcohol  Use Disorder Severe, in early remission; Sedative Use Disorder, Severe, in early remission; Bipolar II Disorder; and Tobacco Use Disorder  Collaboration of Care: N/A  Patient/Guardian was advised Release of Information must be obtained prior to any record release in order to collaborate their care with an outside provider. Patient/Guardian was advised if they have not already done so to contact the registration  department to sign all necessary forms in order for us  to release information regarding their care.   Consent: Patient/Guardian gives verbal consent for treatment and assignment of benefits for services provided during this visit. Patient/Guardian expressed understanding and agreed to proceed.   Zell Maier, MA, LCSW, [facility], LCAS 03/27/2023

## 2023-04-03 ENCOUNTER — Ambulatory Visit: Payer: 59 | Admitting: Nurse Practitioner

## 2023-04-10 ENCOUNTER — Telehealth (HOSPITAL_COMMUNITY): Payer: 59 | Admitting: Psychiatry

## 2023-04-17 ENCOUNTER — Ambulatory Visit (HOSPITAL_COMMUNITY): Payer: 59 | Admitting: Licensed Clinical Social Worker

## 2023-05-01 ENCOUNTER — Ambulatory Visit (INDEPENDENT_AMBULATORY_CARE_PROVIDER_SITE_OTHER): Payer: 59 | Admitting: Licensed Clinical Social Worker

## 2023-05-01 ENCOUNTER — Encounter (HOSPITAL_COMMUNITY): Payer: Self-pay

## 2023-05-01 DIAGNOSIS — F3181 Bipolar II disorder: Secondary | ICD-10-CM

## 2023-05-01 DIAGNOSIS — F1021 Alcohol dependence, in remission: Secondary | ICD-10-CM

## 2023-05-01 DIAGNOSIS — F1321 Sedative, hypnotic or anxiolytic dependence, in remission: Secondary | ICD-10-CM

## 2023-05-01 DIAGNOSIS — F411 Generalized anxiety disorder: Secondary | ICD-10-CM

## 2023-05-01 NOTE — Progress Notes (Signed)
 THERAPIST PROGRESS NOTE  Session Time: 1 p.m. to  2:03 p.m.   Virtual Visit via Video Note   I connected with Ian Zavala at 1 p.m. EST by a video enabled telemedicine application and verified that I am speaking with the correct person using two identifiers.   Location: Patient: home Provider: [facility] office   I discussed the limitations of evaluation and management by telemedicine and the availability of in person appointments. The patient expressed understanding and agreed to proceed.  Type of Therapy: Individual   Therapist Response/Interventions: CBT/The therapist provides detailed information on how to do a CBT thought journal aimed at identifying and disputing irrational thinking providing him the homework of doing this daily between now and when he returns.  The therapist sends Ian Zavala some resources via email to assist with this task and how to do it.   Treatment Goals addressed:  Active     Substance Use     Ian Zavala will not use drugs or alcohol 7/7 days per week as evidenced by weekly UDS and will obtain a Sponsor and begin working steps per self-report.      Start:  03/27/22    Expected End:  06/20/22         Ian Zavala will report a reduction in his depressive symptoms as evidenced by having a PHQ- score of 4 or less.  (Not Progressing)     Start:  03/27/22    Expected End:  06/20/22         Therapist will assist Ian Zavala in identifying and develop strategies to avoid triggers for use while assisting him in navigating any barriers in developing a sober support system.      Start:  03/27/22      Therapist will assist Ian Zavala in identifying and changing thoughts and behaviors that contribute to his depression.                   Summary: Ian Zavala presents saying that the dinner with is mother-in-law went well and that he has continued to try and avoid his mother. He dreamed about his father waking up feeling happy; however, after having a dream about his mother, he woke up feeling  bad.  Ian Zavala says that he has been working a lot of overtime and is back in the mode of feeling like he's going through the motions and has been "in a funk lately." His PCP passed away which was "kind of a shock." He did make an appointment with another doctor through the [facility] network. He says that it was "kind of depressing" in addition to finding out that a co-worker who recently passed away died also.  He feels like "everything is a struggle" including work and finances. He says that he was supposed to work a double shift but got sick with a stomach bug and could not return for the 2nd shift feeling guilty still as he let people down at work. He then was telling himself that people were probably thinking he was "faking it."   Ian Zavala says that he has been enjoying some movies as his wife bought him a new TV. He bought a sound bar which made him happy for a week. Now, he has lost interest in watching TV with everything being an irritation to him.   He feels like his medication was doing as it has done before as it will work at first and then seem to drop back down. He has been thinking those thoughts, "I'm a failure," "  things are going to get better," etcetera.   He says that drinking has not crossed his mind. The major focus of this session involves the need for Ian Zavala to start actively disputing his irrational self-talk versus just trying to distract himself from it via staying busy at work. The therapist points out that Ian Zavala can only distract himself for so long so needs tools to address it head on.    Progress Towards Goals: Progressing  Suicidal/Homicidal: Ian Zavala reports no SI or HI   Plan: Return again in one month per his request  Diagnosis: Alcohol Use Disorder Severe, in early remission; Sedative Use Disorder, Severe, in early remission; Bipolar II Disorder; and Tobacco Use Disorder  Collaboration of Care: N/A  Patient/Guardian was advised Release of Information must be obtained prior to any  record release in order to collaborate their care with an outside provider. Patient/Guardian was advised if they have not already done so to contact the registration department to sign all necessary forms in order for [place] to release information regarding their care.   Consent: Patient/Guardian gives verbal consent for treatment and assignment of benefits for services provided during this visit. Patient/Guardian expressed understanding and agreed to proceed.   Myrna Blazer, MA, LCSW, [facility], LCAS 05/01/2023

## 2023-05-07 NOTE — Progress Notes (Unsigned)
 New Patient Office Visit  Subjective    Patient ID: Ian Zavala, male    DOB: 11-24-1975  Age: 48 y.o. MRN: [id number]  CC: No chief complaint on file.   HPI Isao I. Hoefling presents to establish care ***   Hypertension: - Medications: *** - Compliance: *** - Checking BP at home: *** - Denies any SOB, recurrent headaches, CP, vision changes, LE edema, dizziness, palpitations, or medication side effects. - Diet: *** - Exercise: *** - Stressors:  Hyperlipidemia: - medications: *** - compliance: *** - medication SEs: *** The 10-year ASCVD risk score (Arnett DK, et al., 2019) is: 5.1%   Values used to calculate the score:     Age: 9 years     Sex: Male     Is Non-Hispanic African American: No     Diabetic: No     Tobacco smoker: No     Systolic Blood Pressure: 126 mmHg     Is BP treated: Yes     HDL Cholesterol: 45 mg/dL     Total Cholesterol: 265 mg/dL   Diabetes: - Checking glucose at home: *** - Medications: *** - Compliance: *** - Denies symptoms of hypoglycemia, polyuria, polydipsia, numbness extremities, foot ulcers/trauma, wounds that are not healing, medication side effects  Lab Results  Component Value Date   HGBA1C 5.6 01/01/2023     Mood follow-up: - Diagnosis: Bipolar II, GAD - Treatment:  - Medication side effects:  - SI/HI:  - Update:   Low testosterone: -       Outpatient Encounter Medications as of 05/08/2023  Medication Sig   ARIPiprazole (ABILIFY) 2 MG tablet Take 1 tablet (2 mg total) by mouth daily.   bisoprolol (ZEBETA) 10 MG tablet TAKE 1 TABLET BY MOUTH EVERY DAY FOR BLOOD PRESSURE   busPIRone (BUSPAR) 10 MG tablet TAKE 1 TABLET 2 TO 3 X /DAY FOR ANXIETY   cholecalciferol (VITAMIN D) 1000 UNITS tablet Take 1,000 Units by mouth daily. Take 5000 IU daily.   enalapril (VASOTEC) 20 MG tablet TAKE 2 TABLETS DAILY FOR BLOOD PRESSURE   gabapentin (NEURONTIN) 300 MG capsule Take 1 capsule (300 mg total) by mouth 3  (three) times daily as needed (anxiety).   hydrochlorothiazide (HYDRODIURIL) 25 MG tablet TAKE 1 TABLET EVERY MORNINGFOR BLOOD PRESSURE AND     FLUID RETENTION   lamoTRIgine (LAMICTAL) 200 MG tablet Take 1 tablet  2 x / day for Mood Swings   Magnesium 400 MG CAPS Take 1 capsule by mouth daily.   naltrexone (DEPADE) 50 MG tablet TAKE 1 TABLET DAILY TO     SUPPRESS ALCOHOL CRAVINGS   Omega-3 Fatty Acids (FISH OIL) 1000 MG CAPS Take 2,000 mg by mouth daily.   omeprazole (PRILOSEC) 40 MG capsule TAKE 1 CAPSULE DAILY TO    PREVENT INDIGESTION AND    HEARTBURN   propranolol (INDERAL) 10 MG tablet TAKE 1 TABLET 3-4 TIMES A  DAY AS NEEDED FOR ACUTE    ANXIETY OR PANIC ATTACK   rosuvastatin (CRESTOR) 20 MG tablet TAKE 1 TABLET 3 DAYS PER   WEEK FOR CHOLESTEROL   sertraline (ZOLOFT) 100 MG tablet Take 2 tablets ( 200 mg) Daily for Mood                               /  TAKE                                         BY                                                 MOUTH   testosterone cypionate (DEPOTESTOSTERONE CYPIONATE) 200 MG/ML injection Inject 1 ml into Muscle every 7 days   traZODone (DESYREL) 150 MG tablet TAKE 1/2 TO 1 TABLET 1 TO 2 HOURS BEFORE BEDTIME AS NEEDED FOR SLEEP   No facility-administered encounter medications on file as of 05/08/2023.    Past Medical History:  Diagnosis Date   Anxiety    Depression    GERD (gastroesophageal reflux disease)    Hyperlipidemia    Hypertension    Hypogonadism male    Obesity    OSA on CPAP 2019   Vitamin D deficiency     No past surgical history on file.  Family History  Problem Relation Age of Onset   Hypertension Mother    Depression Mother    Fibromyalgia Mother    Depression Brother    Hypertension Brother    Cancer Father 16       urethral/lung cancer   Depression Sister     Social History   Socioeconomic History   Marital status: Married    Spouse name: Not on  file   Number of children: Not on file   Years of education: Not on file   Highest education level: Not on file  Occupational History   Not on file  Tobacco Use   Smoking status: Never   Smokeless tobacco: Never  Substance and Sexual Activity   Alcohol use: Yes    Types: 12 drink(s) per week   Drug use: No   Sexual activity: Yes  Other Topics Concern   Not on file  Social History Narrative   Not on file   Social Drivers of Health   Financial Resource Strain: Not on file  Food Insecurity: Not on file  Transportation Needs: Not on file  Physical Activity: Not on file  Stress: Not on file  Social Connections: Not on file  Intimate Partner Violence: Not on file    ROS      Objective    There were no vitals taken for this visit.  Physical Exam  {Labs (Optional):23779}    Assessment & Plan:   Problem List Items Addressed This Visit   None Visit Diagnoses       Encounter for medical examination to establish care    -  Primary       No follow-ups on file.   Clayborne Dana, NP

## 2023-05-08 ENCOUNTER — Ambulatory Visit: Payer: 59 | Admitting: Family Medicine

## 2023-05-08 ENCOUNTER — Encounter: Payer: Self-pay | Admitting: Family Medicine

## 2023-05-08 VITALS — BP 108/65 | HR 62 | Temp 97.8°F | Resp 18 | Ht 76.0 in | Wt 248.4 lb

## 2023-05-08 DIAGNOSIS — F3181 Bipolar II disorder: Secondary | ICD-10-CM | POA: Diagnosis not present

## 2023-05-08 DIAGNOSIS — I10 Essential (primary) hypertension: Secondary | ICD-10-CM

## 2023-05-08 DIAGNOSIS — F419 Anxiety disorder, unspecified: Secondary | ICD-10-CM

## 2023-05-08 DIAGNOSIS — R7309 Other abnormal glucose: Secondary | ICD-10-CM

## 2023-05-08 DIAGNOSIS — E782 Mixed hyperlipidemia: Secondary | ICD-10-CM

## 2023-05-08 DIAGNOSIS — M545 Low back pain, unspecified: Secondary | ICD-10-CM

## 2023-05-08 DIAGNOSIS — Z0000 Encounter for general adult medical examination without abnormal findings: Secondary | ICD-10-CM

## 2023-05-08 DIAGNOSIS — E559 Vitamin D deficiency, unspecified: Secondary | ICD-10-CM

## 2023-05-08 DIAGNOSIS — G4730 Sleep apnea, unspecified: Secondary | ICD-10-CM

## 2023-05-08 DIAGNOSIS — K219 Gastro-esophageal reflux disease without esophagitis: Secondary | ICD-10-CM

## 2023-05-08 DIAGNOSIS — N529 Male erectile dysfunction, unspecified: Secondary | ICD-10-CM

## 2023-05-08 DIAGNOSIS — F1990 Other psychoactive substance use, unspecified, uncomplicated: Secondary | ICD-10-CM | POA: Insufficient documentation

## 2023-05-08 LAB — LIPID PANEL
Cholesterol: 138 mg/dL (ref 0–200)
HDL: 33.2 mg/dL — ABNORMAL LOW (ref >39.00)
LDL Cholesterol: 85 mg/dL (ref 0–99)
NonHDL: 104.57
Total CHOL/HDL Ratio: 4
Triglycerides: 98 mg/dL (ref 0.0–149.0)
VLDL: 19.6 mg/dL (ref 0.0–40.0)

## 2023-05-08 LAB — CBC WITH DIFFERENTIAL/PLATELET
Basophils Absolute: 0 10*3/uL (ref 0.0–0.1)
Basophils Relative: 0.5 % (ref 0.0–3.0)
Eosinophils Absolute: 0.1 10*3/uL (ref 0.0–0.7)
Eosinophils Relative: 1.6 % (ref 0.0–5.0)
HCT: 41.8 % (ref 39.0–52.0)
Hemoglobin: 14.3 g/dL (ref 13.0–17.0)
Lymphocytes Relative: 22.4 % (ref 12.0–46.0)
Lymphs Abs: 1 10*3/uL (ref 0.7–4.0)
MCHC: 34.2 g/dL (ref 30.0–36.0)
MCV: 93.3 fl (ref 78.0–100.0)
Monocytes Absolute: 0.4 10*3/uL (ref 0.1–1.0)
Monocytes Relative: 7.9 % (ref 3.0–12.0)
Neutro Abs: 3.1 10*3/uL (ref 1.4–7.7)
Neutrophils Relative %: 67.6 % (ref 43.0–77.0)
Platelets: 246 10*3/uL (ref 150.0–400.0)
RBC: 4.48 Mil/uL (ref 4.22–5.81)
RDW: 12.3 % (ref 11.5–15.5)
WBC: 4.6 10*3/uL (ref 4.0–10.5)

## 2023-05-08 LAB — TSH: TSH: 1.15 u[IU]/mL (ref 0.35–5.50)

## 2023-05-08 LAB — COMPREHENSIVE METABOLIC PANEL
ALT: 24 U/L (ref 0–53)
AST: 21 U/L (ref 0–37)
Albumin: 4.9 g/dL (ref 3.5–5.2)
Alkaline Phosphatase: 56 U/L (ref 39–117)
BUN: 11 mg/dL (ref 6–23)
CO2: 31 meq/L (ref 19–32)
Calcium: 9.8 mg/dL (ref 8.4–10.5)
Chloride: 101 meq/L (ref 96–112)
Creatinine, Ser: 1.09 mg/dL (ref 0.40–1.50)
GFR: 80.88 mL/min (ref >60.00)
Glucose, Bld: 102 mg/dL — ABNORMAL HIGH (ref 70–99)
Potassium: 4.4 meq/L (ref 3.5–5.1)
Sodium: 140 meq/L (ref 135–145)
Total Bilirubin: 0.5 mg/dL (ref 0.2–1.2)
Total Protein: 6.8 g/dL (ref 6.0–8.3)

## 2023-05-08 LAB — VITAMIN D 25 HYDROXY (VIT D DEFICIENCY, FRACTURES): VITD: 59.82 ng/mL (ref 30.00–100.00)

## 2023-05-08 LAB — HEMOGLOBIN A1C: Hgb A1c MFr Bld: 5.4 % (ref 4.6–6.5)

## 2023-05-08 NOTE — Assessment & Plan Note (Signed)
 No SI/HI - Continue current psychiatric medications as prescribed by psychiatrist.

## 2023-05-08 NOTE — Assessment & Plan Note (Signed)
 No SI/HI. Following with behavioral health. Continue current regimen.

## 2023-05-08 NOTE — Patient Instructions (Signed)

## 2023-05-08 NOTE — Assessment & Plan Note (Signed)
Continue supplement Labs today

## 2023-05-08 NOTE — Assessment & Plan Note (Signed)
 Alcohol and alprazolam use disorder in remission. Managed with naltrexone. - Continue naltrexone as prescribed. - Discuss any changes with psychiatrist.

## 2023-05-08 NOTE — Assessment & Plan Note (Signed)
 Blood pressure is at goal for age and co-morbidities.   Recommendations: continue enalapril 40 mg daily, hctz 25 mg daily, bisoprolol 10 mg daily - BP goal <130/80 - monitor and log blood pressures at home - check around the same time each day in a relaxed setting - Limit salt to <2000 mg/day - Follow DASH eating plan (heart healthy diet) - limit alcohol to 2 standard drinks per day for men and 1 per day for women - avoid tobacco products - get at least 2 hours of regular aerobic exercise weekly Patient aware of signs/symptoms requiring further/urgent evaluation. Labs updated today.

## 2023-05-08 NOTE — Assessment & Plan Note (Signed)
 Medication management: Crestor 20 mg 3 days/week Lifestyle factors for lowering cholesterol include: Diet therapy - heart-healthy diet rich in fruits, veggies, fiber-rich whole grains, lean meats, chicken, fish (at least twice a week), fat-free or 1% dairy products; foods low in saturated/trans fats, cholesterol, sodium, and sugar. Mediterranean diet has shown to be very heart healthy. Regular exercise - recommend at least 30 minutes a day, 5 times per week Weight management  Repeat CMP and lipid panel today

## 2023-05-08 NOTE — Assessment & Plan Note (Signed)
 Elevated in the past. Labs today.

## 2023-05-08 NOTE — Assessment & Plan Note (Signed)
-   Refer to urologist for management of testosterone therapy and potential erectile dysfunction treatment.

## 2023-05-08 NOTE — Assessment & Plan Note (Signed)
 Stable with lifestyle measures and omeprazole

## 2023-05-09 ENCOUNTER — Encounter: Payer: Self-pay | Admitting: Family Medicine
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
## 2023-05-15 ENCOUNTER — Telehealth (HOSPITAL_BASED_OUTPATIENT_CLINIC_OR_DEPARTMENT_OTHER): Payer: 59 | Admitting: Psychiatry

## 2023-05-15 DIAGNOSIS — F418 Other specified anxiety disorders: Secondary | ICD-10-CM

## 2023-05-15 DIAGNOSIS — F332 Major depressive disorder, recurrent severe without psychotic features: Secondary | ICD-10-CM | POA: Diagnosis not present

## 2023-05-15 DIAGNOSIS — F411 Generalized anxiety disorder: Secondary | ICD-10-CM

## 2023-05-15 DIAGNOSIS — F419 Anxiety disorder, unspecified: Secondary | ICD-10-CM

## 2023-05-15 DIAGNOSIS — F1321 Sedative, hypnotic or anxiolytic dependence, in remission: Secondary | ICD-10-CM

## 2023-05-15 DIAGNOSIS — F1021 Alcohol dependence, in remission: Secondary | ICD-10-CM | POA: Diagnosis not present

## 2023-05-15 MED ORDER — ARIPIPRAZOLE 5 MG PO TABS: 5.0000 mg | ORAL_TABLET | Freq: Every day | ORAL | 2 refills | Status: DC

## 2023-05-15 MED ORDER — SERTRALINE HCL 100 MG PO TABS: 100.0000 mg | ORAL_TABLET | Freq: Every day | ORAL | 0 refills | Status: DC

## 2023-05-15 MED ORDER — BUSPIRONE HCL 10 MG PO TABS: ORAL_TABLET | ORAL | 1 refills | Status: DC

## 2023-05-15 MED ORDER — GABAPENTIN 300 MG PO CAPS: 300.0000 mg | ORAL_CAPSULE | Freq: Three times a day (TID) | ORAL | 2 refills | Status: DC | PRN

## 2023-05-15 MED ORDER — NALTREXONE HCL 50 MG PO TABS: ORAL_TABLET | ORAL | 1 refills | Status: DC

## 2023-05-16 ENCOUNTER — Encounter (HOSPITAL_COMMUNITY): Payer: Self-pay | Admitting: Psychiatry

## 2023-05-29 ENCOUNTER — Other Ambulatory Visit (HOSPITAL_COMMUNITY): Payer: Self-pay | Admitting: Psychiatry

## 2023-05-29 ENCOUNTER — Ambulatory Visit (INDEPENDENT_AMBULATORY_CARE_PROVIDER_SITE_OTHER): Admitting: Licensed Clinical Social Worker

## 2023-05-29 DIAGNOSIS — F1021 Alcohol dependence, in remission: Secondary | ICD-10-CM

## 2023-05-29 DIAGNOSIS — F3181 Bipolar II disorder: Secondary | ICD-10-CM

## 2023-05-29 DIAGNOSIS — F419 Anxiety disorder, unspecified: Secondary | ICD-10-CM

## 2023-05-29 DIAGNOSIS — F332 Major depressive disorder, recurrent severe without psychotic features: Secondary | ICD-10-CM

## 2023-05-29 NOTE — Progress Notes (Signed)
 THERAPIST PROGRESS NOTE  Session Time: 1:59 p.m. to 2:48 p.m.   Virtual Visit via Video Note   I connected with Ian Zavala at 1:59 p.m. EST by a video enabled telemedicine application and verified that I am speaking with the correct person using two identifiers.   Location: Patient: home Provider: [facility] office   I discussed the limitations of evaluation and management by telemedicine and the availability of in person appointments. The patient expressed understanding and agreed to proceed.  Type of Therapy: Individual   Therapist Response/Interventions: CBT and Solution-Focused/The therapist suggests that Ian Zavala start doing the CBT thought journal, continue to keep his work schedule within reason, and begin to search for a hobby or activity that he can look forward to doing at least once a week as he presently has no pleasurable activities in his schedule which is not conducive to being depression-free. The therapist explores possible hobbies he can pursue.   Treatment Goals addressed:  Active     Substance Use     Ian Zavala will abstain from drugs and alcohol for a continuous period of 6 months per self-report and random urine drug screens and/or breathalyzers as indicated. (Progressing)     Start:  02/20/23    Expected End:  08/20/23         Ian Zavala will report a reduction in his symptoms of depression as evidenced by having a PHQ-9 score of 4 or less.  He will no longer have thoughts of suicide without plan or intent. (Not Progressing)     Start:  02/20/23    Expected End:  08/20/23         The therapist says Ian Zavala and being able to identify and avoid triggers for using substances while helping him to overcome any barriers or obstacles and developing a sober support system.     Start:  02/20/23         The therapist will assist Ian Zavala in being able to identify and change thoughts and behaviors that are contributing to his severe depression and anxiety.     Start:  02/20/23                         Summary: Ian Zavala presents saying that he did print the sheet on irrational thoughts which was helpful but as for journaling, "no so much" as "other things take priority."  He says that he is probably doing "exactly the same" as far as "going through the motions." He has cut back from not working any overtime unless it is absolutely needed.   Ian Zavala denies drinking or other substance use. He says that he is taking his medications regularly saying that he saw the psychiatrist a couple of weeks ago with Gabapentin being added back in and his Abilify being increased. Thus far, he has not noticed much of a difference. Ian Zavala no longer does AA meetings.   When asked what he does for fun, he says, "not much," though adds that baseball started which is an interest he has.   Ian Zavala admits to not having any hobbies or pleasurable activities but after talking with this therapist about this realizes that he could get into bicycling as his wife would like to do this also. Ian Zavala says that he used to enjoy reading with the prospect of joining a book club being discussed as well.  He does say that he seems to be doing a little better with is self-talk.    Progress Towards  Goals: Progressing  Suicidal/Homicidal: Ian Zavala reports no SI or HI   Plan: Return again in one month per his request  Diagnosis: Alcohol Use Disorder Severe, in early remission; Sedative Use Disorder, Severe, in early remission; Bipolar II Disorder; and Tobacco Use Disorder  Collaboration of Care: N/A  Patient/Guardian was advised Release of Information must be obtained prior to any record release in order to collaborate their care with an outside provider. Patient/Guardian was advised if they have not already done so to contact the registration department to sign all necessary forms in order for [place] to release information regarding their care.   Consent: Patient/Guardian gives verbal consent for treatment and assignment of  benefits for services provided during this visit. Patient/Guardian expressed understanding and agreed to proceed.   Myrna Blazer, MA, LCSW, [facility], LCAS 05/29/2023
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
## 2023-06-12 ENCOUNTER — Other Ambulatory Visit (HOSPITAL_COMMUNITY): Payer: Self-pay | Admitting: Psychiatry

## 2023-06-12 ENCOUNTER — Encounter (HOSPITAL_COMMUNITY): Payer: Self-pay | Admitting: Psychiatry

## 2023-06-12 ENCOUNTER — Telehealth (HOSPITAL_COMMUNITY): Admitting: Psychiatry

## 2023-06-12 DIAGNOSIS — F332 Major depressive disorder, recurrent severe without psychotic features: Secondary | ICD-10-CM

## 2023-06-12 DIAGNOSIS — F411 Generalized anxiety disorder: Secondary | ICD-10-CM

## 2023-06-12 DIAGNOSIS — F1021 Alcohol dependence, in remission: Secondary | ICD-10-CM | POA: Diagnosis not present

## 2023-06-12 DIAGNOSIS — F1321 Sedative, hypnotic or anxiolytic dependence, in remission: Secondary | ICD-10-CM | POA: Diagnosis not present

## 2023-06-12 MED ORDER — GABAPENTIN 300 MG PO CAPS: 300.0000 mg | ORAL_CAPSULE | Freq: Three times a day (TID) | ORAL | 2 refills | Status: DC | PRN

## 2023-06-12 MED ORDER — ARIPIPRAZOLE 5 MG PO TABS: 5.0000 mg | ORAL_TABLET | Freq: Every day | ORAL | 0 refills | Status: DC

## 2023-06-12 MED ORDER — SERTRALINE HCL 100 MG PO TABS: ORAL_TABLET | ORAL | 0 refills | Status: DC

## 2023-06-12 MED ORDER — PROPRANOLOL HCL 10 MG PO TABS: 10.0000 mg | ORAL_TABLET | Freq: Three times a day (TID) | ORAL | 0 refills | Status: DC

## 2023-06-13 ENCOUNTER — Encounter: Payer: Self-pay | Admitting: Urology

## 2023-06-13 ENCOUNTER — Ambulatory Visit: Admitting: Urology

## 2023-06-13 VITALS — BP 121/81 | HR 62 | Ht 76.0 in | Wt 250.0 lb

## 2023-06-13 DIAGNOSIS — E291 Testicular hypofunction: Secondary | ICD-10-CM | POA: Diagnosis not present

## 2023-06-13 NOTE — Progress Notes (Signed)
 Assessment: 1. Hypogonadism in male     Plan: Labs today: Testosterone , CBC, estradiol  I discussed options for management of his hypogonadism including topical therapy, oral therapy, subcutaneous injections, long-acting injections, and subcutaneous pellets.  He would like to continue with his IM injections at the present time. Continue current dose of testosterone  cypionate 100 mg every 2 weeks pending lab results. Return to office in 6 months.  Chief Complaint:  Chief Complaint  Patient presents with   Hypogonadism    History of Present Illness:  Ian Zavala is a 48 y.o. male who is seen in consultation from Everlina Hock, NP for evaluation of male hypogonadism.  He has been on testosterone  replacement with IM injections for at least 10 years.  This has been managed by his PCP.  He reports previously trying topical therapy but discontinued this due to the odor.  He is currently injecting 100 mg every 2 weeks.  He reports good symptom relief with his current regimen.  He had been off of testosterone  for several months while in rehab.  He has resumed treatment for approximately 2 months.  No problem with injections.  No recent lab results.  His last testosterone  level in November 2024 was 255. H&H from 11/24: 14.6/43.4 PSA from 11/24: 0.15 He last received an injection 1 week ago.  He reports problems with erectile dysfunction.  He is on a number of antidepressant medications.  He has used Cialis with improvement.  He reports urinary frequency and urgency.  No dysuria or gross hematuria. IPSS = 10/1.   Past Medical History:  Past Medical History:  Diagnosis Date   Anxiety    Depression    GERD (gastroesophageal reflux disease)    Hyperlipidemia    Hypertension    Hypogonadism male    Obesity    OSA on CPAP 2019   Vitamin D  deficiency     Past Surgical History:  No past surgical history on file.  Allergies:  Allergies  Allergen Reactions   Citalopram  Other (See Comments)    anxiety   Pravastatin    Wellbutrin [Bupropion]     Family History:  Family History  Problem Relation Age of Onset   Hypertension Mother    Depression Mother    Fibromyalgia Mother    Cancer Father 92       urethral/lung cancer   Depression Sister    Depression Brother    Hypertension Brother     Social History:  Social History   Tobacco Use   Smoking status: Never   Smokeless tobacco: Never  Substance Use Topics   Alcohol  use: Not Currently    Comment: sober for 1 year   Drug use: No    Review of symptoms:  Constitutional:  Negative for unexplained weight loss, night sweats, fever, chills ENT:  Negative for nose bleeds, sinus pain, painful swallowing CV:  Negative for chest pain, shortness of breath, exercise intolerance, palpitations, loss of consciousness Resp:  Negative for cough, wheezing, shortness of breath GI:  Negative for nausea, vomiting, diarrhea, bloody stools GU:  Positives noted in HPI; otherwise negative for gross hematuria, dysuria, urinary incontinence Neuro:  Negative for seizures, poor balance, limb weakness, slurred speech Psych:  Negative for lack of energy, depression, anxiety Endocrine:  Negative for polydipsia, polyuria, symptoms of hypoglycemia (dizziness, hunger, sweating) Hematologic:  Negative for anemia, purpura, petechia, prolonged or excessive bleeding, use of anticoagulants  Allergic:  Negative for difficulty breathing or choking as a result of  exposure to anything; no shellfish allergy; no allergic response (rash/itch) to materials, foods  Physical exam: BP 121/81   Pulse 62   Ht 6\' 4"  (1.93 m)   Wt 250 lb (113.4 kg)   BMI 30.43 kg/m  GENERAL APPEARANCE:  Well appearing, well developed, well nourished, NAD HEENT: Atraumatic, Normocephalic, oropharynx clear. NECK: Supple without lymphadenopathy or thyromegaly. LUNGS: Clear to auscultation bilaterally. HEART: Regular Rate and Rhythm without murmurs,  gallops, or rubs. ABDOMEN: Soft, non-tender, No Masses. EXTREMITIES: Moves all extremities well.  Without clubbing, cyanosis, or edema. NEUROLOGIC:  Alert and oriented x 3, normal gait, CN II-XII grossly intact.  MENTAL STATUS:  Appropriate. BACK:  Non-tender to palpation.  No CVAT SKIN:  Warm, dry and intact.    Results: None

## 2023-06-14 ENCOUNTER — Encounter: Payer: Self-pay | Admitting: Urology

## 2023-06-14 LAB — ESTRADIOL: Estradiol: 24.8 pg/mL (ref 7.6–42.6)

## 2023-06-14 LAB — CBC
Hematocrit: 41.5 % (ref 37.5–51.0)
Hemoglobin: 13.9 g/dL (ref 13.0–17.7)
MCH: 31.4 pg (ref 26.6–33.0)
MCHC: 33.5 g/dL (ref 31.5–35.7)
MCV: 94 fL (ref 79–97)
Platelets: 212 10*3/uL (ref 150–450)
RBC: 4.43 x10E6/uL (ref 4.14–5.80)
RDW: 12.1 % (ref 11.6–15.4)
WBC: 5.3 10*3/uL (ref 3.4–10.8)

## 2023-06-14 LAB — TESTOSTERONE: Testosterone: 967 ng/dL — ABNORMAL HIGH (ref 264–916)

## 2023-06-18 ENCOUNTER — Other Ambulatory Visit: Payer: Self-pay | Admitting: Urology

## 2023-06-18 ENCOUNTER — Other Ambulatory Visit (HOSPITAL_COMMUNITY): Payer: Self-pay | Admitting: Medical

## 2023-06-18 DIAGNOSIS — E291 Testicular hypofunction: Secondary | ICD-10-CM

## 2023-06-18 DIAGNOSIS — F1021 Alcohol dependence, in remission: Secondary | ICD-10-CM

## 2023-06-26 ENCOUNTER — Ambulatory Visit (HOSPITAL_COMMUNITY): Admitting: Licensed Clinical Social Worker

## 2023-06-30 ENCOUNTER — Encounter (HOSPITAL_COMMUNITY): Payer: Self-pay

## 2023-07-02 ENCOUNTER — Other Ambulatory Visit (HOSPITAL_COMMUNITY): Payer: Self-pay | Admitting: Psychiatry

## 2023-07-02 DIAGNOSIS — F332 Major depressive disorder, recurrent severe without psychotic features: Secondary | ICD-10-CM

## 2023-07-02 MED ORDER — ARIPIPRAZOLE 5 MG PO TABS: 5.0000 mg | ORAL_TABLET | Freq: Every day | ORAL | 0 refills | Status: DC

## 2023-07-03 ENCOUNTER — Ambulatory Visit: Payer: 59 | Admitting: Internal Medicine

## 2023-07-10 ENCOUNTER — Ambulatory Visit (INDEPENDENT_AMBULATORY_CARE_PROVIDER_SITE_OTHER): Admitting: Licensed Clinical Social Worker

## 2023-07-10 DIAGNOSIS — F1021 Alcohol dependence, in remission: Secondary | ICD-10-CM | POA: Diagnosis not present

## 2023-07-10 DIAGNOSIS — F3181 Bipolar II disorder: Secondary | ICD-10-CM

## 2023-07-10 DIAGNOSIS — F411 Generalized anxiety disorder: Secondary | ICD-10-CM

## 2023-07-10 NOTE — Progress Notes (Signed)
 THERAPIST PROGRESS NOTE  Session Time: 2:28 p.m. to 3:25 p.m.   Virtual Visit via Video Note   I connected with Ian Zavala at 2:28  p.m. EST by a video enabled telemedicine application and verified that I am speaking with the correct person using two identifiers.   Location: Patient: home Provider: [facility] office   I discussed the limitations of evaluation and management by telemedicine and the availability of in person appointments. The patient expressed understanding and agreed to proceed.  Type of Therapy: Individual   Therapist Response/Interventions: CBT and Solution-Focused/The therapist discusses the need for Yafet to assert himself. He encourage him to continue remaining physically active and suggests that Ian Zavala could return to meetings in whatever fashion he chooses. The therapist informs Ian Zavala that he alone is in charge of his recovery and can attend meetings Sponsor or not as the only requirement for membership is a desire to stop drinking. He validates that it is a positive thing that Ian Zavala can now identify high risk situations or triggers and did not drink as he admits he would have done a year ago.   Treatment Goals addressed:  Active     Substance Use     Ian Zavala will abstain from drugs and alcohol  for a continuous period of 6 months per self-report and random urine drug screens and/or breathalyzers as indicated. (Progressing)     Start:  02/20/23    Expected End:  08/20/23         Ian Zavala will report a reduction in his symptoms of depression as evidenced by having a PHQ-9 score of 4 or less.  He will no longer have thoughts of suicide without plan or intent. (Progressing)     Start:  02/20/23    Expected End:  08/20/23         The therapist says Ian Zavala and being able to identify and avoid triggers for using substances while helping him to overcome any barriers or obstacles and developing a sober support system.     Start:  02/20/23         The therapist will assist Ian Zavala  in being able to identify and change thoughts and behaviors that are contributing to his severe depression and anxiety.     Start:  02/20/23                           Summary: Ian Zavala presents saying that he is not drinking and not drugging and exercising daily having walked 2 miles per day. He is "doing a little read" reading ab book called Good Boundaries and Good Vibes. He says that he feels "pretty stable right now." His son, age 71, is getting married next week which he says is exciting.  Ian Zavala says that he has not working as much and started a period of 9 days off. He says that he has been doing a lot of things he has never done before such as going to bed early and getting up early. He says that he has not worked on the "bike thing yet" wanting to start slow.   He bought a grill so he can start grilling more to try and be a "family person" more. His PHQ-9 score today is a 5 and his GAD-7 is a 7. He says that he does have a lot of social anxiety noting that he basically does not want to socialize with anyone as he will zone out.  After discussing the issue  with Ian Zavala not wanting to talk with his wife or co-worker, it becomes apparent that his co-worker only talks about pickleball and his wife constantly brings up things needing to be done around the house with neither of the conversations getting to topics that Ian Zavala would like to discuss. He says that when he did enjoy conversations with his wife, it was about the kids or family.   He is able to recognize that rather than address this issue assertively that he is responding passively so as to avoid conflict; however, it becomes apparent that he is tuning out of the conversations which leads to some conflict. Ian Zavala says that he will talk to his wife about possibly joining a session.  Additionally, he says that his wife and other family members were talking about going to [place] for a trip he did not think he could make and says that he  recognized that being alone would be a possible high risk situation as he might think he could have a couple of drinks and "get away" with it. He spoke to a co-worker who is in recovery who offered to be available for calls but Ian Zavala says that he now believes he can make the trip.  The therapist points out that Ian Zavala can come out of retirement and resume occasional AA meetings if he would like with Ian Zavala saying that he would actually like to attend some speaker meetings as he enjoyed these and as he is off this week would have time to do this. He says that he has remained sober so far.   Progress Towards Goals: Progressing  Suicidal/Homicidal: Ian Zavala reports no SI or HI   Plan: says he will talk to his wife and schedule with this therapist again on a p.r.n. basis after doing so and will attend an AA meeting this week possibly today  Diagnosis: Alcohol  Use Disorder Severe, in early remission; Sedative Use Disorder, Severe, in early remission; Bipolar II Disorder; and Tobacco Use Disorder  Collaboration of Care: N/A  Patient/Guardian was advised Release of Information must be obtained prior to any record release in order to collaborate their care with an outside provider. Patient/Guardian was advised if they have not already done so to contact the registration department to sign all necessary forms in order for us  to release information regarding their care.   Consent: Patient/Guardian gives verbal consent for treatment and assignment of benefits for services provided during this visit. Patient/Guardian expressed understanding and agreed to proceed.   Melynda Stagger, MA, LCSW, [facility], LCAS 07/10/2023

## 2023-07-16 ENCOUNTER — Encounter: Payer: Self-pay | Admitting: Family Medicine

## 2023-07-17 MED ORDER — ROSUVASTATIN CALCIUM 20 MG PO TABS: ORAL_TABLET | ORAL | 3 refills | Status: AC
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
## 2023-08-07 ENCOUNTER — Telehealth (HOSPITAL_BASED_OUTPATIENT_CLINIC_OR_DEPARTMENT_OTHER): Admitting: Psychiatry

## 2023-08-07 ENCOUNTER — Encounter (HOSPITAL_COMMUNITY): Payer: Self-pay | Admitting: Psychiatry

## 2023-08-07 DIAGNOSIS — F1021 Alcohol dependence, in remission: Secondary | ICD-10-CM | POA: Diagnosis not present

## 2023-08-07 DIAGNOSIS — F1321 Sedative, hypnotic or anxiolytic dependence, in remission: Secondary | ICD-10-CM | POA: Diagnosis not present

## 2023-08-07 DIAGNOSIS — F332 Major depressive disorder, recurrent severe without psychotic features: Secondary | ICD-10-CM | POA: Diagnosis not present

## 2023-08-07 DIAGNOSIS — F411 Generalized anxiety disorder: Secondary | ICD-10-CM

## 2023-08-07 MED ORDER — SERTRALINE HCL 100 MG PO TABS: ORAL_TABLET | ORAL | 0 refills | Status: DC

## 2023-08-07 MED ORDER — ARIPIPRAZOLE 5 MG PO TABS
5.0000 mg | ORAL_TABLET | Freq: Every day | ORAL | 0 refills | Status: DC
Start: 2023-08-07 — End: 2023-10-16

## 2023-08-07 MED ORDER — GABAPENTIN 300 MG PO CAPS
300.0000 mg | ORAL_CAPSULE | Freq: Three times a day (TID) | ORAL | 2 refills | Status: DC | PRN
Start: 2023-08-07 — End: 2023-10-16

## 2023-08-07 MED ORDER — PROPRANOLOL HCL 10 MG PO TABS: 10.0000 mg | ORAL_TABLET | Freq: Three times a day (TID) | ORAL | 0 refills | Status: DC | PRN

## 2023-08-22 ENCOUNTER — Encounter (HOSPITAL_COMMUNITY): Payer: Self-pay

## 2023-08-22 DIAGNOSIS — F3181 Bipolar II disorder: Secondary | ICD-10-CM

## 2023-08-23 MED ORDER — LAMOTRIGINE 200 MG PO TABS: ORAL_TABLET | ORAL | 0 refills | Status: DC

## 2023-09-28 ENCOUNTER — Other Ambulatory Visit (HOSPITAL_COMMUNITY): Payer: Self-pay | Admitting: Psychiatry

## 2023-09-28 DIAGNOSIS — F332 Major depressive disorder, recurrent severe without psychotic features: Secondary | ICD-10-CM

## 2023-10-04 ENCOUNTER — Ambulatory Visit: Payer: 59 | Admitting: Internal Medicine

## 2023-10-09 ENCOUNTER — Encounter: Payer: Self-pay | Admitting: Family Medicine

## 2023-10-09 ENCOUNTER — Other Ambulatory Visit (HOSPITAL_COMMUNITY): Payer: Self-pay | Admitting: Psychiatry

## 2023-10-09 DIAGNOSIS — F332 Major depressive disorder, recurrent severe without psychotic features: Secondary | ICD-10-CM

## 2023-10-09 MED ORDER — OMEPRAZOLE 40 MG PO CPDR: DELAYED_RELEASE_CAPSULE | ORAL | 0 refills | Status: DC
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
## 2023-10-16 ENCOUNTER — Encounter (HOSPITAL_COMMUNITY): Payer: Self-pay | Admitting: Psychiatry

## 2023-10-16 ENCOUNTER — Telehealth (HOSPITAL_BASED_OUTPATIENT_CLINIC_OR_DEPARTMENT_OTHER): Admitting: Psychiatry

## 2023-10-16 ENCOUNTER — Other Ambulatory Visit (HOSPITAL_COMMUNITY): Payer: Self-pay | Admitting: Psychiatry

## 2023-10-16 DIAGNOSIS — F332 Major depressive disorder, recurrent severe without psychotic features: Secondary | ICD-10-CM

## 2023-10-16 DIAGNOSIS — F3181 Bipolar II disorder: Secondary | ICD-10-CM

## 2023-10-16 DIAGNOSIS — G4701 Insomnia due to medical condition: Secondary | ICD-10-CM

## 2023-10-16 DIAGNOSIS — F1321 Sedative, hypnotic or anxiolytic dependence, in remission: Secondary | ICD-10-CM | POA: Diagnosis not present

## 2023-10-16 DIAGNOSIS — F1021 Alcohol dependence, in remission: Secondary | ICD-10-CM

## 2023-10-16 DIAGNOSIS — F411 Generalized anxiety disorder: Secondary | ICD-10-CM | POA: Diagnosis not present

## 2023-10-16 DIAGNOSIS — F419 Anxiety disorder, unspecified: Secondary | ICD-10-CM

## 2023-10-16 MED ORDER — GABAPENTIN 300 MG PO CAPS
300.0000 mg | ORAL_CAPSULE | Freq: Three times a day (TID) | ORAL | 2 refills | Status: DC | PRN
Start: 2023-10-16 — End: 2024-01-09

## 2023-10-16 MED ORDER — BUSPIRONE HCL 10 MG PO TABS: ORAL_TABLET | ORAL | 1 refills | Status: AC

## 2023-10-16 MED ORDER — NALTREXONE HCL 50 MG PO TABS: ORAL_TABLET | ORAL | 1 refills | Status: AC

## 2023-10-16 MED ORDER — ARIPIPRAZOLE 5 MG PO TABS: 5.0000 mg | ORAL_TABLET | Freq: Every day | ORAL | 0 refills | Status: DC

## 2023-10-16 MED ORDER — PROPRANOLOL HCL 10 MG PO TABS: 10.0000 mg | ORAL_TABLET | Freq: Three times a day (TID) | ORAL | 0 refills | Status: DC | PRN

## 2023-10-16 MED ORDER — SERTRALINE HCL 100 MG PO TABS: ORAL_TABLET | ORAL | 0 refills | Status: DC

## 2023-10-16 MED ORDER — LAMOTRIGINE 200 MG PO TABS: ORAL_TABLET | ORAL | 0 refills | Status: DC

## 2023-10-16 MED ORDER — TRAZODONE HCL 150 MG PO TABS: 75.0000 mg | ORAL_TABLET | Freq: Every day | ORAL | 1 refills | Status: DC

## 2023-10-23 ENCOUNTER — Encounter: Payer: Self-pay | Admitting: Family Medicine

## 2023-10-23 DIAGNOSIS — I10 Essential (primary) hypertension: Secondary | ICD-10-CM

## 2023-10-23 NOTE — Telephone Encounter (Signed)
 Established care in March, never written here.

## 2023-10-24 MED ORDER — ENALAPRIL MALEATE 20 MG PO TABS: ORAL_TABLET | ORAL | 0 refills | Status: DC

## 2023-10-30 ENCOUNTER — Ambulatory Visit (INDEPENDENT_AMBULATORY_CARE_PROVIDER_SITE_OTHER): Admitting: Licensed Clinical Social Worker

## 2023-10-30 DIAGNOSIS — F411 Generalized anxiety disorder: Secondary | ICD-10-CM | POA: Diagnosis not present

## 2023-10-30 DIAGNOSIS — F1020 Alcohol dependence, uncomplicated: Secondary | ICD-10-CM

## 2023-10-30 DIAGNOSIS — F1021 Alcohol dependence, in remission: Secondary | ICD-10-CM

## 2023-10-30 DIAGNOSIS — F332 Major depressive disorder, recurrent severe without psychotic features: Secondary | ICD-10-CM
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
## 2023-12-13 ENCOUNTER — Ambulatory Visit: Admitting: Urology

## 2023-12-26 ENCOUNTER — Other Ambulatory Visit (HOSPITAL_COMMUNITY): Payer: Self-pay | Admitting: Psychiatry

## 2023-12-26 ENCOUNTER — Other Ambulatory Visit: Payer: Self-pay | Admitting: Family Medicine

## 2023-12-26 DIAGNOSIS — F3181 Bipolar II disorder: Secondary | ICD-10-CM

## 2024-01-02 ENCOUNTER — Ambulatory Visit: Admitting: Urology

## 2024-01-02 ENCOUNTER — Encounter: Payer: Self-pay | Admitting: Family Medicine

## 2024-01-02 DIAGNOSIS — I10 Essential (primary) hypertension: Secondary | ICD-10-CM

## 2024-01-02 MED ORDER — BISOPROLOL FUMARATE 10 MG PO TABS: 10.0000 mg | ORAL_TABLET | Freq: Every day | ORAL | 1 refills | Status: AC

## 2024-01-03 ENCOUNTER — Telehealth (HOSPITAL_COMMUNITY): Admitting: Psychiatry

## 2024-01-08 ENCOUNTER — Encounter: Payer: Self-pay | Admitting: Family Medicine

## 2024-01-08 ENCOUNTER — Ambulatory Visit (INDEPENDENT_AMBULATORY_CARE_PROVIDER_SITE_OTHER): Admitting: Family Medicine

## 2024-01-08 VITALS — BP 110/76 | HR 60 | Temp 98.1°F | Resp 18 | Ht 76.0 in | Wt 243.6 lb

## 2024-01-08 DIAGNOSIS — E559 Vitamin D deficiency, unspecified: Secondary | ICD-10-CM

## 2024-01-08 DIAGNOSIS — Z87898 Personal history of other specified conditions: Secondary | ICD-10-CM | POA: Diagnosis not present

## 2024-01-08 DIAGNOSIS — I10 Essential (primary) hypertension: Secondary | ICD-10-CM

## 2024-01-08 DIAGNOSIS — E782 Mixed hyperlipidemia: Secondary | ICD-10-CM

## 2024-01-08 DIAGNOSIS — Z0000 Encounter for general adult medical examination without abnormal findings: Secondary | ICD-10-CM

## 2024-01-08 DIAGNOSIS — Z1211 Encounter for screening for malignant neoplasm of colon: Secondary | ICD-10-CM

## 2024-01-08 LAB — COMPREHENSIVE METABOLIC PANEL WITH GFR
ALT: 20 U/L (ref 0–53)
AST: 20 U/L (ref 0–37)
Albumin: 4.9 g/dL (ref 3.5–5.2)
Alkaline Phosphatase: 62 U/L (ref 39–117)
BUN: 11 mg/dL (ref 6–23)
CO2: 31 meq/L (ref 19–32)
Calcium: 9.7 mg/dL (ref 8.4–10.5)
Chloride: 103 meq/L (ref 96–112)
Creatinine, Ser: 1.13 mg/dL (ref 0.40–1.50)
GFR: 77.09 mL/min (ref >60.00)
Glucose, Bld: 93 mg/dL (ref 70–99)
Potassium: 4.7 meq/L (ref 3.5–5.1)
Sodium: 142 meq/L (ref 135–145)
Total Bilirubin: 0.6 mg/dL (ref 0.2–1.2)
Total Protein: 6.6 g/dL (ref 6.0–8.3)

## 2024-01-08 LAB — TSH: TSH: 1.64 u[IU]/mL (ref 0.35–5.50)

## 2024-01-08 LAB — LIPID PANEL
Cholesterol: 188 mg/dL (ref 0–200)
HDL: 40.1 mg/dL (ref >39.00)
LDL Cholesterol: 123 mg/dL — ABNORMAL HIGH (ref 0–99)
NonHDL: 147.86
Total CHOL/HDL Ratio: 5
Triglycerides: 124 mg/dL (ref 0.0–149.0)
VLDL: 24.8 mg/dL (ref 0.0–40.0)

## 2024-01-08 LAB — HEMOGLOBIN A1C: Hgb A1c MFr Bld: 5.4 % (ref 4.6–6.5)

## 2024-01-08 LAB — VITAMIN D 25 HYDROXY (VIT D DEFICIENCY, FRACTURES): VITD: 55.8 ng/mL (ref 30.00–100.00)

## 2024-01-08 NOTE — Progress Notes (Signed)
 Complete physical exam  Patient: Ian Zavala   DOB: 09-23-1975   48 y.o. Male  MRN: [id number]  Subjective:    Chief Complaint  Patient presents with   Annual Exam    Ian Zavala is a 48 y.o. male who presents today for a complete physical exam. He reports consuming a general diet. Home exercise routine includes walking. He generally feels well. He reports sleeping well. He does not have additional problems to discuss today.   Currently lives with: wife and son Acute concerns or interim problems since last visit: no  Chronic Problems  Hypertension: - Medications: enalapril  40 mg daily, hctz 25 mg daily, bisoprolol  10 mg daily - Compliance: good - Checking BP at home: no - Denies any SOB, recurrent headaches, CP, vision changes, LE edema, dizziness, palpitations, or medication side effects. - Diet: general - Exercise: physically demanding job     Hyperlipidemia: - medications: Crestor  20 mg three days per week, Omega-3 supplement - compliance: good - medication SEs: none The 10-year ASCVD risk score (Arnett DK, et al., 2019) is: 2.3%   Values used to calculate the score:     Age: 54 years     Clincally relevant sex: Male     Is Non-Hispanic African American: No     Diabetic: No     Tobacco smoker: No     Systolic Blood Pressure: 110 mmHg     Is BP treated: Yes     HDL Cholesterol: 33.2 mg/dL     Total Cholesterol: 138 mg/dL    Hx of hyperglycemia: - Checking glucose at home: no - Medications: none - Compliance: n/a - Denies symptoms of hypoglycemia, polyuria, polydipsia, numbness extremities, foot ulcers/trauma, wounds that are not healing, medication side effects  Lab Results  Component Value Date   HGBA1C 5.4 05/08/2023     Mood follow-up: Hx xanax /alcohol  abuse - sober since 2024 - Diagnosis: Bipolar II, GAD - Treatment: Abilify  2 mg daily, Buspar  10 mg 2-3x/day as needed, Lamictal  200 mg BID, PRN propranolol  10 mg, Zoloft  200 mg daily,  Trazodone  75-150 mg PRN bedtime, gabapentin  300 mg PRN, naltrexone  50 mg daily for alcohol  cravings - Medication side effects: none - SI/HI: no - Following with [facility] (Dr. Carvin and Leila, PA; counseling with Elsie Maier)     Vitamin d  deficiency: - 5,000 units OTC daily  Vision concerns: no Dental concerns: no  ETOH use: no Nicotine use: pouches Recreational drugs/illegal substances: no  Most recent fall risk assessment:    05/08/2023   10:14 AM  Fall Risk   Falls in the past year? 0  Number falls in past yr: 0  Injury with Fall? 0  Risk for fall due to : Medication side effect  Follow up Falls evaluation completed     Most recent depression screenings:    01/08/2024   10:14 AM 07/10/2023    2:36 PM  PHQ 2/9 Scores  PHQ - 2 Score 2   PHQ- 9 Score 6      Information is confidential and restricted. Go to Review Flowsheets to unlock data.            Patient Care Team: Almarie Waddell NOVAK, NP as PCP - General (Family Medicine)   Outpatient Medications Prior to Visit  Medication Sig   ARIPiprazole  (ABILIFY ) 5 MG tablet Take 1 tablet (5 mg total) by mouth daily.   bisoprolol  (ZEBETA ) 10 MG tablet Take 1 tablet (10 mg total) by mouth daily.  busPIRone  (BUSPAR ) 10 MG tablet Take 1 tablet 2 to 3 x /day for Anxiety   cholecalciferol (VITAMIN D ) 1000 UNITS tablet Take 1,000 Units by mouth daily. Take 5000 IU daily.   enalapril  (VASOTEC ) 20 MG tablet TAKE 2 TABLETS DAILY FOR BLOOD PRESSURE   gabapentin  (NEURONTIN ) 300 MG capsule Take 1 capsule (300 mg total) by mouth 3 (three) times daily as needed (anxiety).   hydrochlorothiazide  (HYDRODIURIL ) 25 MG tablet TAKE 1 TABLET EVERY MORNINGFOR BLOOD PRESSURE AND     FLUID RETENTION   lamoTRIgine  (LAMICTAL ) 200 MG tablet TAKE 1 TABLET TWICE A DAY  FOR MOOD SWINGS   Magnesium 400 MG CAPS Take 1 capsule by mouth daily.   naltrexone  (DEPADE) 50 MG tablet Take  1 tablet  Daily  to Suppress Alcohol  Cravings   Omega-3 Fatty Acids  (FISH OIL) 1000 MG CAPS Take 2,000 mg by mouth daily.   omeprazole  (PRILOSEC) 40 MG capsule Take 1 capsule (40 mg total) by mouth daily.   propranolol  (INDERAL ) 10 MG tablet Take 1 tablet (10 mg total) by mouth 3 (three) times daily as needed (anxiety). TAKE 1 TABLET 3 TIMES A  DAY AS NEEDED FOR ACUTE    ANXIETY OR PANIC ATTACK   rosuvastatin  (CRESTOR ) 20 MG tablet TAKE 1 TABLET 3 DAYS PER   WEEK FOR CHOLESTEROL   sertraline  (ZOLOFT ) 100 MG tablet Take 1 tablet (100 mg total) by mouth daily AND 0.5 tablets (50 mg total) at bedtime.   testosterone  cypionate (DEPOTESTOSTERONE CYPIONATE) 200 MG/ML injection Inject 1 ml into Muscle every 7 days   traZODone  (DESYREL ) 150 MG tablet Take 0.5 tablets (75 mg total) by mouth at bedtime.   No facility-administered medications prior to visit.    ROS All review of systems negative except what is listed in the HPI        Objective:     BP 110/76   Pulse 60   Temp 98.1 F (36.7 C)   Resp 18   Ht 6' 4 (1.93 m)   Wt 243 lb 9.6 oz (110.5 kg)   SpO2 98%   BMI 29.65 kg/m    Physical Exam Vitals reviewed.  Constitutional:      General: He is not in acute distress.    Appearance: Normal appearance. He is not ill-appearing.  HENT:     Head: Normocephalic and atraumatic.     Right Ear: Tympanic membrane normal.     Left Ear: Tympanic membrane normal.     Nose: Nose normal.     Mouth/Throat:     Mouth: Mucous membranes are moist.     Pharynx: Oropharynx is clear.  Eyes:     Extraocular Movements: Extraocular movements intact.     Conjunctiva/sclera: Conjunctivae normal.     Pupils: Pupils are equal, round, and reactive to light.  Cardiovascular:     Rate and Rhythm: Normal rate and regular rhythm.     Pulses: Normal pulses.     Heart sounds: Normal heart sounds.  Pulmonary:     Effort: Pulmonary effort is normal.     Breath sounds: Normal breath sounds.  Abdominal:     General: Abdomen is flat. Bowel sounds are normal. There is no  distension.     Palpations: Abdomen is soft. There is no mass.     Tenderness: There is no abdominal tenderness. There is no right CVA tenderness, left CVA tenderness, guarding or rebound.  Genitourinary:    Comments: Deferred exam Musculoskeletal:  General: Normal range of motion.     Cervical back: Normal range of motion and neck supple. No tenderness.     Right lower leg: No edema.     Left lower leg: No edema.  Lymphadenopathy:     Cervical: No cervical adenopathy.  Skin:    General: Skin is warm and dry.     Capillary Refill: Capillary refill takes less than 2 seconds.  Neurological:     General: No focal deficit present.     Mental Status: He is alert and oriented to person, place, and time. Mental status is at baseline.  Psychiatric:        Mood and Affect: Mood normal.        Behavior: Behavior normal.        Thought Content: Thought content normal.        Judgment: Judgment normal.      No results found for any visits on 01/08/24.     Assessment & Plan:    Routine Health Maintenance and Physical Exam Discussed health promotion and safety including diet and exercise recommendations, dental health, and injury prevention. Tobacco cessation if applicable. Seat belts, sunscreen, smoke detectors, etc.    Immunization History  Administered Date(s) Administered   Fluzone  Influenza virus vaccine,trivalent (IIV3), split virus 01/01/2023   HPV 9-valent 06/27/2017   Influenza Inj Mdck Quad With Preservative 11/08/2016   Influenza Whole 12/19/2011   Influenza, Seasonal, Injecte, Preservative Fre 02/02/2015   Influenza,inj,Quad PF,6+ Mos 12/26/2021   Moderna Sars-Covid-2 Vaccination 05/26/2019, 06/23/2019   PPD Test 01/09/2013, 04/06/2015, 12/26/2021, 01/01/2023   Td 02/21/2004   Tdap 04/06/2015    Health Maintenance  Topic Date Due   Fecal DNA (Cologuard)  Never done   Influenza Vaccine  09/21/2023   COVID-19 Vaccine (3 - Moderna risk series) 05/06/2024  (Originally 07/21/2019)   Pneumococcal Vaccine (1 of 2 - PCV) 01/06/2025 (Originally 12/12/1994)   Hepatitis B Vaccines 19-59 Average Risk (1 of 3 - 19+ 3-dose series) 01/06/2025 (Originally 12/12/1994)   HPV VACCINES (2 - 3-dose SCDM series) 01/06/2025 (Originally 07/25/2017)   DTaP/Tdap/Td (3 - Td or Tdap) 04/05/2025   Hepatitis C Screening  Completed   HIV Screening  Completed   Meningococcal B Vaccine  Aged Out        Problem List Items Addressed This Visit       Active Problems   Essential hypertension (Chronic)   Relevant Orders   Comprehensive metabolic panel with GFR   TSH   Hyperlipidemia (Chronic)   Relevant Orders   Lipid panel   Vitamin D  deficiency (Chronic)   Relevant Orders   VITAMIN D  25 Hydroxy (Vit-D Deficiency, Fractures)   History of prediabetes   Relevant Orders   Hemoglobin A1c   Other Visit Diagnoses       Annual physical exam    -  Primary   Relevant Orders   Comprehensive metabolic panel with GFR   Lipid panel   TSH     Screen for colon cancer       Relevant Orders   Cologuard          PATIENT COUNSELING:    Recommend that most people either abstain from alcohol  or drink within safe limits (<=14/week and <=4 drinks/occasion for males, <=7/weeks and <= 3 drinks/occasion for females) and that the risk for alcohol  disorders and other health effects rises proportionally with the number of drinks per week and how often a drinker exceeds daily limits.  Diet: Recommend to  adjust caloric intake to maintain or achieve ideal body weight, to reduce intake of dietary saturated fat and total fat, to limit sodium intake by avoiding high sodium foods and not adding table salt, and to maintain adequate dietary potassium and calcium  preferably from fresh fruits, vegetables, and low-fat dairy products.   Emphasized the importance of regular exercise.  Injury prevention: Recommend seatbelts, safety helmets, smoke detector, etc..   Dental health: Recommend  regular tooth brushing, flossing, and dental visits.       Return in about 4 months (around 05/07/2024) for chronic disease management.     Waddell KATHEE Mon, NP  I,Emily Lagle,acting as a scribe for Waddell KATHEE Mon, NP.,have documented all relevant documentation on the behalf of Waddell KATHEE Mon, NP.  I, Waddell KATHEE Mon, NP, have reviewed all documentation for this visit. The documentation on 01/08/2024 for the exam, diagnosis, procedures, and orders are all accurate and complete.

## 2024-01-09 ENCOUNTER — Other Ambulatory Visit (HOSPITAL_COMMUNITY): Payer: Self-pay | Admitting: Psychiatry

## 2024-01-09 DIAGNOSIS — F332 Major depressive disorder, recurrent severe without psychotic features: Secondary | ICD-10-CM

## 2024-01-11 ENCOUNTER — Other Ambulatory Visit: Payer: Self-pay | Admitting: Nurse Practitioner

## 2024-01-11 DIAGNOSIS — I10 Essential (primary) hypertension: Secondary | ICD-10-CM

## 2024-01-14 ENCOUNTER — Ambulatory Visit: Payer: Self-pay | Admitting: Family Medicine

## 2024-01-15 ENCOUNTER — Telehealth (HOSPITAL_COMMUNITY): Admitting: Psychiatry

## 2024-01-16 ENCOUNTER — Encounter: Payer: Self-pay | Admitting: Family Medicine

## 2024-01-16 DIAGNOSIS — I10 Essential (primary) hypertension: Secondary | ICD-10-CM

## 2024-01-16 MED ORDER — ENALAPRIL MALEATE 20 MG PO TABS: 40.0000 mg | ORAL_TABLET | Freq: Every day | ORAL | 1 refills | Status: AC

## 2024-01-22 ENCOUNTER — Encounter: Payer: 59 | Admitting: Internal Medicine
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
## 2024-01-29 LAB — COLOGUARD: COLOGUARD: NEGATIVE

## 2024-02-02 ENCOUNTER — Telehealth (HOSPITAL_COMMUNITY): Admitting: Psychiatry

## 2024-02-05 ENCOUNTER — Ambulatory Visit (HOSPITAL_COMMUNITY): Admitting: Licensed Clinical Social Worker

## 2024-02-13 ENCOUNTER — Encounter (HOSPITAL_COMMUNITY): Payer: Self-pay

## 2024-02-13 DIAGNOSIS — F332 Major depressive disorder, recurrent severe without psychotic features: Secondary | ICD-10-CM

## 2024-02-13 MED ORDER — ARIPIPRAZOLE 5 MG PO TABS: 5.0000 mg | ORAL_TABLET | Freq: Every day | ORAL | 0 refills | Status: AC

## 2024-02-17 ENCOUNTER — Other Ambulatory Visit (HOSPITAL_COMMUNITY): Payer: Self-pay | Admitting: Psychiatry

## 2024-02-17 DIAGNOSIS — F332 Major depressive disorder, recurrent severe without psychotic features: Secondary | ICD-10-CM

## 2024-02-17 DIAGNOSIS — F411 Generalized anxiety disorder: Secondary | ICD-10-CM

## 2024-02-19 ENCOUNTER — Ambulatory Visit (HOSPITAL_COMMUNITY): Admitting: Licensed Clinical Social Worker

## 2024-02-19 DIAGNOSIS — F1021 Alcohol dependence, in remission: Secondary | ICD-10-CM

## 2024-02-19 DIAGNOSIS — F411 Generalized anxiety disorder: Secondary | ICD-10-CM

## 2024-02-19 DIAGNOSIS — F332 Major depressive disorder, recurrent severe without psychotic features: Secondary | ICD-10-CM

## 2024-02-19 DIAGNOSIS — F630 Pathological gambling: Secondary | ICD-10-CM

## 2024-02-19 NOTE — Progress Notes (Signed)
 THERAPIST PROGRESS NOTE  Session Time: 11:02 a.m. to 12:15 p.m.   Virtual Visit via Video Note   I connected with Ian Zavala at 11:02 a.m.. EST by a video enabled telemedicine application and verified that I am speaking with the correct person using two identifiers.   Location: Patient: home Provider: [facility] office   I discussed the limitations of evaluation and management by telemedicine and the availability of in person appointments. The patient expressed understanding and agreed to proceed.  Type of Therapy: Individual   Therapist Response/Interventions: CBT/the therapist addresses the irrational thinking underlying his gambling as well as thoughts about possibly being to drink like a social drinker.  The therapist challenges Ian Zavala to consider what people get from attending meetings observing that he stopped attending while never attending steps and has apparently switched addictions.   The therapist points to benefits of meeting attendance that Ian Zavala has not considered.   Treatment Goals addressed:  Active     Substance Use     Ian Zavala will abstain from drugs and alcohol  for a continuous period of 6 months per self-report and random urine drug screens and/or breathalyzers as indicated. (Progressing)     Start:  02/20/23    Expected End:  04/28/24         Ian Zavala will report a reduction in his symptoms of depression as evidenced by having a PHQ-9 score of 4 or less.  He will no longer have thoughts of suicide without plan or intent. (Progressing)     Start:  02/20/23    Expected End:  04/28/24         The therapist says Ian Zavala and being able to identify and avoid triggers for using substances while helping him to overcome any barriers or obstacles and developing a sober support system.     Start:  02/20/23         The therapist will assist Ian Zavala in being able to identify and change thoughts and behaviors that are contributing to his severe depression and anxiety.     Start:   02/20/23                                 Summary: Ian Zavala presents saying that his son's issues seemed to have resolved on their own. As for his mom, he says that things have been going good and they went to her house around Christmas. As for sobriety, he says that he is still sober having about 16 months sober.   He says that the holidays were a little bit triggering and drinking crossed his mind; however, he kept thinking about the consequences. He has struggled with the thought that he could go back to being a normal drinker as he has been sober long enough.   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 He eventually discloses that his gambling has gotten out-of-control with Ian Zavala having spent about 40K on on it in the past year-and-a-half. He says that his wife is not aware of this debt.   The major focus of the session involves what Ian Zavala needs to do about this.    Progress Towards Goals: Not progressing  Suicidal/Homicidal: Ian Zavala reports no SI or HI   Plan: he wants to return in 2 months but is strongly encouraged to get in with this therapist sooner if needed and to call  the problem gambling hotline regarding steps to take to address this issue  Diagnosis: Alcohol  Use Disorder Severe, in early remission; Sedative Use Disorder, Severe, in early remission; Bipolar II Disorder; and Tobacco Use Disorder  Collaboration of Care: N/A  Patient/Guardian was advised Release of Information must be obtained prior to any record release in order to collaborate their care with an outside provider. Patient/Guardian was advised if they have not already done so to contact the registration department to sign all necessary forms in order for us  to release information regarding their care.   Consent: Patient/Guardian gives verbal consent for treatment and assignment of benefits for services provided during this  visit. Patient/Guardian expressed understanding and agreed to proceed.   Zell Maier, MA, LCSW, [facility], LCAS 02/19/2024

## 2024-02-20 ENCOUNTER — Ambulatory Visit: Admitting: Urology

## 2024-02-23 ENCOUNTER — Other Ambulatory Visit (HOSPITAL_COMMUNITY): Payer: Self-pay | Admitting: Psychiatry

## 2024-02-23 DIAGNOSIS — F332 Major depressive disorder, recurrent severe without psychotic features: Secondary | ICD-10-CM
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
## 2024-03-18 ENCOUNTER — Telehealth (HOSPITAL_COMMUNITY): Admitting: Psychiatry

## 2024-03-18 ENCOUNTER — Other Ambulatory Visit (HOSPITAL_COMMUNITY): Payer: Self-pay | Admitting: Medical

## 2024-03-18 ENCOUNTER — Other Ambulatory Visit: Payer: Self-pay | Admitting: Family Medicine

## 2024-03-18 DIAGNOSIS — F1021 Alcohol dependence, in remission: Secondary | ICD-10-CM

## 2024-03-27 ENCOUNTER — Other Ambulatory Visit (HOSPITAL_COMMUNITY): Payer: Self-pay | Admitting: Psychiatry

## 2024-03-27 DIAGNOSIS — F411 Generalized anxiety disorder: Secondary | ICD-10-CM

## 2024-04-01 ENCOUNTER — Telehealth (HOSPITAL_COMMUNITY): Admitting: Psychiatry

## 2024-04-03 ENCOUNTER — Ambulatory Visit: Admitting: Urology

## 2024-04-22 ENCOUNTER — Ambulatory Visit (HOSPITAL_COMMUNITY): Admitting: Licensed Clinical Social Worker

## 2024-05-07 ENCOUNTER — Ambulatory Visit: Admitting: Family Medicine

## 2024-05-20 ENCOUNTER — Ambulatory Visit: Admitting: Urology
